# Patient Record
Sex: Female | Born: 1950 | Race: White | Hispanic: No | Marital: Single | Smoking: Never smoker
Health system: Southern US, Community
[De-identification: ages and names within clinical notes are randomized; demographics above are authoritative.]

## PROBLEM LIST (undated history)

## (undated) DIAGNOSIS — I10 Essential (primary) hypertension: Secondary | ICD-10-CM

## (undated) DIAGNOSIS — E039 Hypothyroidism, unspecified: Secondary | ICD-10-CM

## (undated) DIAGNOSIS — I639 Cerebral infarction, unspecified: Secondary | ICD-10-CM

## (undated) DIAGNOSIS — E785 Hyperlipidemia, unspecified: Secondary | ICD-10-CM

## (undated) DIAGNOSIS — F32A Depression, unspecified: Secondary | ICD-10-CM

## (undated) DIAGNOSIS — E119 Type 2 diabetes mellitus without complications: Secondary | ICD-10-CM

## (undated) DIAGNOSIS — F329 Major depressive disorder, single episode, unspecified: Secondary | ICD-10-CM

## (undated) HISTORY — PX: CHOLECYSTECTOMY: SHX55

## (undated) HISTORY — PX: VAGINAL HYSTERECTOMY: SUR661

## (undated) HISTORY — PX: BACK SURGERY: SHX140

## (undated) HISTORY — PX: APPENDECTOMY: SHX54

---

## 1998-04-07 ENCOUNTER — Emergency Department (HOSPITAL_COMMUNITY): Admission: EM | Admit: 1998-04-07 | Discharge: 1998-04-07 | Payer: Self-pay

## 2001-11-21 ENCOUNTER — Encounter: Admission: RE | Admit: 2001-11-21 | Discharge: 2001-11-21 | Payer: Self-pay | Admitting: Family Medicine

## 2001-11-21 ENCOUNTER — Encounter: Payer: Self-pay | Admitting: Family Medicine

## 2001-11-28 ENCOUNTER — Encounter: Payer: Self-pay | Admitting: Family Medicine

## 2001-11-28 ENCOUNTER — Encounter: Admission: RE | Admit: 2001-11-28 | Discharge: 2001-11-28 | Payer: Self-pay | Admitting: Family Medicine

## 2002-09-19 ENCOUNTER — Encounter (INDEPENDENT_AMBULATORY_CARE_PROVIDER_SITE_OTHER): Payer: Self-pay | Admitting: Specialist

## 2002-09-19 ENCOUNTER — Inpatient Hospital Stay (HOSPITAL_COMMUNITY): Admission: RE | Admit: 2002-09-19 | Discharge: 2002-09-21 | Payer: Self-pay | Admitting: Obstetrics and Gynecology

## 2002-11-11 ENCOUNTER — Encounter: Payer: Self-pay | Admitting: Emergency Medicine

## 2002-11-11 ENCOUNTER — Emergency Department (HOSPITAL_COMMUNITY): Admission: EM | Admit: 2002-11-11 | Discharge: 2002-11-11 | Payer: Self-pay | Admitting: Emergency Medicine

## 2003-05-16 ENCOUNTER — Encounter: Payer: Self-pay | Admitting: Emergency Medicine

## 2003-05-16 ENCOUNTER — Emergency Department (HOSPITAL_COMMUNITY): Admission: EM | Admit: 2003-05-16 | Discharge: 2003-05-16 | Payer: Self-pay | Admitting: Emergency Medicine

## 2004-07-20 ENCOUNTER — Encounter: Admission: RE | Admit: 2004-07-20 | Discharge: 2004-07-20 | Payer: Self-pay | Admitting: Obstetrics and Gynecology

## 2005-08-21 ENCOUNTER — Encounter: Admission: RE | Admit: 2005-08-21 | Discharge: 2005-08-21 | Payer: Self-pay | Admitting: Family Medicine

## 2005-09-09 ENCOUNTER — Ambulatory Visit (HOSPITAL_COMMUNITY): Admission: RE | Admit: 2005-09-09 | Discharge: 2005-09-09 | Payer: Self-pay | Admitting: Urology

## 2005-12-02 ENCOUNTER — Encounter: Admission: RE | Admit: 2005-12-02 | Discharge: 2005-12-02 | Payer: Self-pay | Admitting: Obstetrics and Gynecology

## 2006-01-03 ENCOUNTER — Encounter: Admission: RE | Admit: 2006-01-03 | Discharge: 2006-01-03 | Payer: Self-pay | Admitting: Obstetrics and Gynecology

## 2006-06-28 ENCOUNTER — Encounter: Admission: RE | Admit: 2006-06-28 | Discharge: 2006-06-28 | Payer: Self-pay | Admitting: Obstetrics and Gynecology

## 2006-09-12 ENCOUNTER — Encounter: Admission: RE | Admit: 2006-09-12 | Discharge: 2006-09-12 | Payer: Self-pay | Admitting: Family Medicine

## 2007-06-14 ENCOUNTER — Ambulatory Visit: Payer: Self-pay | Admitting: Internal Medicine

## 2007-06-21 ENCOUNTER — Encounter: Admission: RE | Admit: 2007-06-21 | Discharge: 2007-06-21 | Payer: Self-pay | Admitting: Obstetrics and Gynecology

## 2007-06-26 ENCOUNTER — Ambulatory Visit: Payer: Self-pay | Admitting: Internal Medicine

## 2007-06-26 ENCOUNTER — Encounter: Payer: Self-pay | Admitting: Internal Medicine

## 2007-06-27 ENCOUNTER — Encounter: Admission: RE | Admit: 2007-06-27 | Discharge: 2007-06-27 | Payer: Self-pay | Admitting: Obstetrics and Gynecology

## 2007-12-27 ENCOUNTER — Encounter: Admission: RE | Admit: 2007-12-27 | Discharge: 2007-12-27 | Payer: Self-pay | Admitting: Family Medicine

## 2008-06-25 ENCOUNTER — Encounter: Admission: RE | Admit: 2008-06-25 | Discharge: 2008-06-25 | Payer: Self-pay | Admitting: Family Medicine

## 2009-07-07 ENCOUNTER — Encounter: Admission: RE | Admit: 2009-07-07 | Discharge: 2009-07-07 | Payer: Self-pay | Admitting: Family Medicine

## 2009-08-07 ENCOUNTER — Encounter (INDEPENDENT_AMBULATORY_CARE_PROVIDER_SITE_OTHER): Payer: Self-pay | Admitting: Internal Medicine

## 2009-08-07 ENCOUNTER — Inpatient Hospital Stay (HOSPITAL_COMMUNITY): Admission: EM | Admit: 2009-08-07 | Discharge: 2009-08-07 | Payer: Self-pay | Admitting: Emergency Medicine

## 2009-08-07 ENCOUNTER — Ambulatory Visit: Payer: Self-pay | Admitting: Cardiology

## 2009-08-11 ENCOUNTER — Encounter: Admission: RE | Admit: 2009-08-11 | Discharge: 2009-08-11 | Payer: Self-pay | Admitting: Family Medicine

## 2010-11-05 ENCOUNTER — Observation Stay (HOSPITAL_COMMUNITY)
Admission: EM | Admit: 2010-11-05 | Discharge: 2010-11-08 | Disposition: A | Discharge status: Home / Self Care | Payer: Self-pay | Attending: Internal Medicine | Admitting: Internal Medicine

## 2010-11-05 ENCOUNTER — Emergency Department (HOSPITAL_COMMUNITY): Payer: Self-pay

## 2010-11-05 DIAGNOSIS — M25569 Pain in unspecified knee: Secondary | ICD-10-CM | POA: Insufficient documentation

## 2010-11-05 DIAGNOSIS — F3289 Other specified depressive episodes: Secondary | ICD-10-CM | POA: Insufficient documentation

## 2010-11-05 DIAGNOSIS — I10 Essential (primary) hypertension: Secondary | ICD-10-CM | POA: Insufficient documentation

## 2010-11-05 DIAGNOSIS — F329 Major depressive disorder, single episode, unspecified: Secondary | ICD-10-CM | POA: Insufficient documentation

## 2010-11-05 DIAGNOSIS — E039 Hypothyroidism, unspecified: Secondary | ICD-10-CM | POA: Insufficient documentation

## 2010-11-05 DIAGNOSIS — K5289 Other specified noninfective gastroenteritis and colitis: Principal | ICD-10-CM | POA: Insufficient documentation

## 2010-11-05 DIAGNOSIS — M542 Cervicalgia: Secondary | ICD-10-CM | POA: Insufficient documentation

## 2010-11-05 DIAGNOSIS — E119 Type 2 diabetes mellitus without complications: Secondary | ICD-10-CM | POA: Insufficient documentation

## 2010-11-05 DIAGNOSIS — R55 Syncope and collapse: Secondary | ICD-10-CM | POA: Insufficient documentation

## 2010-11-05 DIAGNOSIS — R51 Headache: Secondary | ICD-10-CM | POA: Insufficient documentation

## 2010-11-05 LAB — URINALYSIS, ROUTINE W REFLEX MICROSCOPIC
Glucose, UA: NEGATIVE mg/dL
Hgb urine dipstick: NEGATIVE
Specific Gravity, Urine: 1.022 (ref 1.005–1.030)
Urobilinogen, UA: 0.2 mg/dL (ref 0.0–1.0)
pH: 6.5 (ref 5.0–8.0)

## 2010-11-05 LAB — POCT CARDIAC MARKERS
Myoglobin, poc: 93.7 ng/mL (ref 12–200)
Myoglobin, poc: 71.2 ng/mL (ref 12–200)
Troponin i, poc: <0.05 ng/mL (ref 0.00–0.09)

## 2010-11-05 LAB — COMPREHENSIVE METABOLIC PANEL
ALT: 18 U/L (ref 0–35)
BUN: 18 mg/dL (ref 6–23)
Chloride: 104 mEq/L (ref 96–112)
GFR calc Af Amer: >60 mL/min (ref >60)
Glucose, Bld: 115 mg/dL — ABNORMAL HIGH (ref 70–99)
Sodium: 137 mEq/L (ref 135–145)
Total Bilirubin: 0.6 mg/dL (ref 0.3–1.2)

## 2010-11-05 LAB — URINE MICROSCOPIC-ADD ON

## 2010-11-05 LAB — DIFFERENTIAL
Lymphocytes Relative: 14 % (ref 12–46)
Lymphs Abs: 2 10*3/uL (ref 0.7–4.0)
Monocytes Absolute: 0.7 10*3/uL (ref 0.1–1.0)
Neutro Abs: 11.2 10*3/uL — ABNORMAL HIGH (ref 1.7–7.7)
Neutrophils Relative %: 79 % — ABNORMAL HIGH (ref 43–77)

## 2010-11-05 LAB — CBC
MCHC: 33.3 g/dL (ref 30.0–36.0)
Platelets: 344 10*3/uL (ref 150–400)
RDW: 12.8 % (ref 11.5–15.5)

## 2010-11-06 ENCOUNTER — Observation Stay (HOSPITAL_COMMUNITY): Payer: Self-pay

## 2010-11-06 LAB — URINE CULTURE
Colony Count: 55000
Culture  Setup Time: 201203160033

## 2010-11-06 LAB — CK TOTAL AND CKMB (NOT AT ARMC)
CK, MB: 1.1 ng/mL (ref 0.3–4.0)
Relative Index: 1 (ref 0.0–2.5)
Total CK: 105 U/L (ref 7–177)

## 2010-11-06 LAB — HEMOGLOBIN A1C: Mean Plasma Glucose: 140 mg/dL — ABNORMAL HIGH (ref <117)

## 2010-11-06 LAB — CLOSTRIDIUM DIFFICILE BY PCR: Toxigenic C. Difficile by PCR: NEGATIVE

## 2010-11-06 LAB — CARDIAC PANEL(CRET KIN+CKTOT+MB+TROPI)
Total CK: 155 U/L (ref 7–177)
Total CK: 173 U/L (ref 7–177)
Troponin I: 0.01 ng/mL (ref 0.00–0.06)

## 2010-11-06 LAB — TROPONIN I: Troponin I: 0.01 ng/mL (ref 0.00–0.06)

## 2010-11-06 LAB — GLUCOSE, CAPILLARY
Glucose-Capillary: 121 mg/dL — ABNORMAL HIGH (ref 70–99)
Glucose-Capillary: 99 mg/dL (ref 70–99)

## 2010-11-07 LAB — CBC
HCT: 33.3 % — ABNORMAL LOW (ref 36.0–46.0)
MCH: 29.9 pg (ref 26.0–34.0)
MCHC: 32.4 g/dL (ref 30.0–36.0)
RDW: 13.4 % (ref 11.5–15.5)

## 2010-11-07 LAB — BASIC METABOLIC PANEL
BUN: 8 mg/dL (ref 6–23)
CO2: 27 mEq/L (ref 19–32)
Calcium: 7.9 mg/dL — ABNORMAL LOW (ref 8.4–10.5)
GFR calc non Af Amer: >60 mL/min (ref >60)
Glucose, Bld: 98 mg/dL (ref 70–99)

## 2010-11-07 LAB — GLUCOSE, CAPILLARY
Glucose-Capillary: 94 mg/dL (ref 70–99)
Glucose-Capillary: 91 mg/dL (ref 70–99)
Glucose-Capillary: 84 mg/dL (ref 70–99)

## 2010-11-08 ENCOUNTER — Other Ambulatory Visit: Payer: Self-pay | Admitting: Internal Medicine

## 2010-11-08 DIAGNOSIS — R072 Precordial pain: Secondary | ICD-10-CM

## 2010-11-09 LAB — GLUCOSE, CAPILLARY: Glucose-Capillary: 115 mg/dL — ABNORMAL HIGH (ref 70–99)

## 2010-11-10 LAB — STOOL CULTURE

## 2010-11-12 LAB — CULTURE, BLOOD (ROUTINE X 2)
Culture  Setup Time: 201203161427
Culture: NO GROWTH
Culture: NO GROWTH

## 2010-11-12 NOTE — Discharge Summary (Signed)
[name], CIMINI                ACCOUNT NO.:  [id number]  MEDICAL RECORD NO.:  [id number]           PATIENT TYPE:  O  LOCATION:  6741                         FACILITY:  [facility]  PHYSICIAN:  Jeoffrey Massed, MD    DATE OF BIRTH:  [birth year]  DATE OF ADMISSION:  11/05/2010 DATE OF DISCHARGE:                        DISCHARGE SUMMARY - REFERRING   PRIMARY CARE PRACTITIONER:  Dr. Shaune Pollack.  PRIMARY DISCHARGE DIAGNOSES: 1. Gastroenteritis, likely viral, now resolved. 2. Syncope, likely vasovagal secondary to above.  SECONDARY DISCHARGE DIAGNOSES: 1. Hypothyroidism. 2. Hypertension. 3. Depression.  DISCHARGE MEDICATIONS: 1. Synthroid 112 mcg 1 tablet p.o. daily. 2. Zofran 4 mg 1 tablet p.o. q.6 h. p.r.n. 3. Aspirin 81 mg 1 tablet p.o. daily. 4. Celexa 20 mg 1 tablet p.o. daily. 5. Imodium 2 mg 1 tablet p.o. daily. 6. Lisinopril/hydrochlorothiazide 10/12.5 one tablet p.o. daily.  CONSULTATION:  None.  BRIEF HISTORY OF PRESENT ILLNESS:  The patient is a very pleasant 60- year-old female with a known history of hypertension, diet-controlled diabetes, depression with prior syncopal events all of them related to vomiting and retching, comes in with nausea, vomiting, and diarrhea. Per the patient, she had numerous episodes of nausea, vomiting, and diarrhea and apparently was in the ED, was evaluated and given IV fluids.  She was also given supportive care and discharged back home. However, when she reached the parking lot, the patient had another episode of nausea with retching and apparently had a syncopal episode. She was then brought back to the ED and then admitted to the hospitalist service for further evaluation and treatment.  PERTINENT RADIOLOGICAL STUDIES: 1. CT of the head without contrast, normal exam. 2. CT of the cervical spine without contrast showed no acute     abnormalities. 3. CT maxillofacial region without contrast, no significant     abnormality. 4.  X-ray of the left knee was negative.  PERTINENT LABORATORY DATA: 1. Stool cultures were negative so far. 2. Blood cultures negative to date. 3. C diff PCR negative. 4. Cardiac enzymes were cycled and these were negative.  BRIEF HOSPITAL COURSE: 1. Gastroenteritis, likely viral.  The patient had no evidence of     fever, leukocytosis.  She was given supportive care in terms of IV     fluids and antiemetics and slowly advanced on her diet.  At this     moment, she is tolerating a regular diet and with no further     nausea, vomiting, or diarrhea.  She will be discharged home. 2. Syncope.  This is clearly vasovagal as this was related to episode     of nausea and retching.  Per the patient, she has had around 4-5     syncopal episodes throughout her lifetime and all of these have     been related to either nausea, vomiting.  This is likely secondary     to increased vagal tone.  A 2-D echocardiogram has been done;     however, we do not have the official reading yet.  I reviewed the     chart and that showed a prior echocardiogram done in 2010  which was     essentially normal.  The patient will follow up with her primary     care practitioner to follow up the echocardiogram.  Her telemetry     monitor was pretty unremarkable. 3. Hypertension.  She is to continue her usual medications.  DISPOSITION:  At this point, the patient is stable to be discharged home.  FOLLOWUP INSTRUCTIONS: 1. The patient will follow up with the primary care practitioner in     the next 5-7 days.  She is to call and make an appointment. 2. When she follows up with her primary care practitioner, a 2-D     echocardiogram that was done today will need to be followed up as     well. 3. Total time spent 45 minutes.     Jeoffrey Massed, MD     SG/MEDQ  D:  11/08/2010  T:  11/08/2010  Job:  [id number]  cc:   [name], M.D.  Electronically Signed by Jeoffrey Massed  on 11/12/2010 08:36:11 PM

## 2010-11-23 LAB — COMPREHENSIVE METABOLIC PANEL
ALT: 31 U/L (ref 0–35)
Alkaline Phosphatase: 46 U/L (ref 39–117)
BUN: 15 mg/dL (ref 6–23)
CO2: 26 mEq/L (ref 19–32)
Chloride: 106 mEq/L (ref 96–112)
GFR calc non Af Amer: >60 mL/min (ref >60)
Glucose, Bld: 139 mg/dL — ABNORMAL HIGH (ref 70–99)
Potassium: 3.8 mEq/L (ref 3.5–5.1)
Sodium: 138 mEq/L (ref 135–145)
Total Bilirubin: 0.6 mg/dL (ref 0.3–1.2)
Total Protein: 6.7 g/dL (ref 6.0–8.3)

## 2010-11-23 LAB — CBC
HCT: 38.5 % (ref 36.0–46.0)
Hemoglobin: 12.9 g/dL (ref 12.0–15.0)
RBC: 4.15 MIL/uL (ref 3.87–5.11)
RDW: 13.4 % (ref 11.5–15.5)

## 2010-11-23 LAB — GLUCOSE, CAPILLARY: Glucose-Capillary: 122 mg/dL — ABNORMAL HIGH (ref 70–99)

## 2010-11-23 LAB — CULTURE, BLOOD (ROUTINE X 2)

## 2010-11-23 LAB — CARDIAC PANEL(CRET KIN+CKTOT+MB+TROPI)
CK, MB: 2.7 ng/mL (ref 0.3–4.0)
Relative Index: 1.9 (ref 0.0–2.5)

## 2010-11-23 NOTE — H&P (Signed)
[name], [name]                ACCOUNT NO.:  [id number]  MEDICAL RECORD NO.:  [id number]           PATIENT TYPE:  E  LOCATION:  [facility]                         FACILITY:  [facility]  PHYSICIAN:  Eduard Clos, MDDATE OF BIRTH:  11-Apr-1951  DATE OF ADMISSION:  11/05/2010 DATE OF DISCHARGE:                             HISTORY & PHYSICAL   PRIMARY CARE PHYSICIAN:  [name], MD  CHIEF COMPLAINTS:  Loss of consciousness, nausea, vomiting and diarrhea.  HISTORY OF PRESENTING ILLNESS:  A 60 year old female with known history of hypertension, diabetes mellitus type 1 diet-controlled, history of depression, has been experiencing some sudden onset of nausea and vomiting today afternoon at work.  She had multiple episode of nausea, vomiting, and diarrhea.  No abdominal pain.  The diarrhea was loose stools and watery.  There was no blood in the vomitus or diarrhea.  The patient was hydrated and given some antiemetics and was initially discharged when she reached car park she had another episode of nausea and she feel unconscious in front of her daughter and hit the car dashboard, she was unconscious for 30 seconds and she had urinated and also there was no seizure-like activity.  The patient woke up spontaneously.  She did not have any focal deficit, did not have any chest pain.  The patient at this time has been readmitted for syncope and persistent nausea and vomiting with diarrhea.  The patient denies any chest pain.  Denies any shortness of breath, palpitation, or dizziness.  Denies any focal deficit.  Denies any abdominal pain, dysuria, or discharges.  She has mild pain in the right maxillary area where she hit the dashboard of the car.  The patient does have some pain in the neck which radiates to her left arm which happened at the same time when she was in the assisted living facility where she was working.  The pain still persists pain.  PAST MEDICAL HISTORY:   Hypertension, diabetes mellitus type 2 diet control, hyperlipidemia, and depression.  PAST SURGICAL HISTORY:  She has had cholecystectomy, back surgery, hysterectomy, lithotripsy, and tubal ligation.  MEDICATIONS PRIOR TO ADMISSION: 1. Lisinopril and hydrochlorothiazide 10/12.5 p.o. daily. 2. Citalopram 20 mg daily. 3. Synthroid 112 mcg daily.  ALLERGIES:  No known drug allergies.  SOCIAL HISTORY:  The patient works as a Lawyer.  She is a full code. Denies smoking cigarette, drinking alcohol, or using illegal drugs.  REVIEW OF SYSTEMS:  As per history of presenting illness, nothing else significant.  PHYSICAL EXAMINATION:  GENERAL:  The patient examined at bedside, not in acute distress. VITAL SIGNS:  Blood pressure is 150/70, pulse 102 per minute and sinus tachy, temperature 98.8, respirations 18, and O2 sat 97%. HEENT:  Anicteric, but has mild bruise in the right maxillary which is tender.  The patient is able to the open mouth without difficulty.  She is able to protrude tongue, there is no tongue bite, tongue is midline. No facial asymmetry.  No neck rigidity. CHEST:  Bilateral air entry present.  No rhonchi.  No crepitation. HEART:  S1 and S2 heard. ABDOMEN:  Soft and  nontender.  Bowel sounds heard. CNS:  Alert, awake, and oriented to time, place, and person. EXTREMITIES:  Peripheral pulses felt.  No edema.  Moves upper and lower extremities 5/5.  Peripheral pulses felt.  No edema.  LABORATORY DATA:  Chest x-ray shows chronic change without active cardiopulmonary disease.  CBC, WBC is 14.1, hemoglobin is 13.9, hematocrit 41.8, platelets 344, and neutrophils 79%.  Complete metabolic panel, sodium 137, potassium 3.5, chloride 104, carbon dioxide 26, glucose 115, BUN 18, creatinine 0.9, total bili is 0.6, alkaline phosphatase 51, AST 28, ALT 18, total bili 7.8, albumin 4, calcium 8.9, CK-MB less than 1, troponin-I less than 0.05, and myoglobin 97.  UA is showing trace  leukocytes, wbc's 0-2, bacteria few, and squamous cells many.  ASSESSMENT: 1. Syncope probably vasovagal and also dehydration. 2. Nausea, vomiting and diarrhea probably viral. 3. Hypertension. 4. Diabetes mellitus type 2, on diet. 5. History of hypothyroidism.  PLAN: 1. At this time, we will admit the patient to telemetry if the patient     had a syncopal episode. 2. For syncope, at this time I think it may be vasovagal after she had     nausea and vomiting spell.  We will observe in the telemetry and at     this time we will hydrate the patient, we will check orthostatics     in a.m., also get a 2-D echo and cycling cardiac markers. 3. The patient did have a fall and hit her face with mild bruise     there, we are going to get a CT head CT sinuses and C-spine. 4. Nausea, vomiting, and diarrhea.  At this time, the patient does     have leukocytosis.  The patient does work in a health care     facility. I am going to get stool for C.diff PCR, culture and     sensitivity, ova and parasite.  I am empirically placing the     patient on Cipro and Flagyl.  Also get a urine culture.  If  her     nausea and vomiting persistent at that time may consider further     radiological studies. 5. Hypertension.  We will continue lisinopril and hold off  the     hydrochlorothiazide. 6. Diabetes.  The patient is on diet.  We will check CBG with     sensitive sliding scale. 7. Further recommendation as condition evolves.    Eduard Clos, MD    ANK/MEDQ  D:  11/05/2010  T:  11/05/2010  Job:  [id number]  cc:   [name], M.D.  Electronically Signed by Midge Minium MD on 11/23/2010 07:53:25 AM

## 2010-11-24 LAB — COMPREHENSIVE METABOLIC PANEL
ALT: 23 U/L (ref 0–35)
AST: 25 U/L (ref 0–37)
Alkaline Phosphatase: 54 U/L (ref 39–117)
CO2: 24 mEq/L (ref 19–32)
Calcium: 9.1 mg/dL (ref 8.4–10.5)
GFR calc Af Amer: >60 mL/min (ref >60)
Potassium: 3.8 mEq/L (ref 3.5–5.1)
Sodium: 137 mEq/L (ref 135–145)
Total Protein: 8 g/dL (ref 6.0–8.3)

## 2010-11-24 LAB — DIFFERENTIAL
Basophils Relative: 1 % (ref 0–1)
Eosinophils Absolute: 0 10*3/uL (ref 0.0–0.7)
Eosinophils Relative: 0 % (ref 0–5)
Lymphs Abs: 0.5 10*3/uL — ABNORMAL LOW (ref 0.7–4.0)
Monocytes Relative: 3 % (ref 3–12)

## 2010-11-24 LAB — URINALYSIS, ROUTINE W REFLEX MICROSCOPIC
Bilirubin Urine: NEGATIVE
Nitrite: NEGATIVE
Specific Gravity, Urine: 1.017 (ref 1.005–1.030)
Urobilinogen, UA: 0.2 mg/dL (ref 0.0–1.0)
pH: 6.5 (ref 5.0–8.0)

## 2010-11-24 LAB — CBC
Hemoglobin: 14.8 g/dL (ref 12.0–15.0)
MCHC: 32.8 g/dL (ref 30.0–36.0)
RBC: 4.82 MIL/uL (ref 3.87–5.11)
RDW: 13.3 % (ref 11.5–15.5)

## 2011-01-08 NOTE — Discharge Summary (Signed)
   NAMECRYSTIE, [name]                           ACCOUNT NO.:  [id number]   MEDICAL RECORD NO.:  [id number]                   PATIENT TYPE:  INP   LOCATION:  0447                                 FACILITY:  [facility]   PHYSICIAN:  Katherine Roan, M.D.               DATE OF BIRTH:  1950/10/14   DATE OF ADMISSION:  09/19/2002  DATE OF DISCHARGE:  09/21/2002                                 DISCHARGE SUMMARY   ADMISSION DIAGNOSES:  1. Abnormal uterine bleeding.  2. Pelvic relaxation.   DISCHARGE DIAGNOSES:  1. Abnormal uterine bleeding.  2. Pelvic relaxation.   OPERATIONS PERFORMED:  1. Pelvic examination under anesthesia.  2. Vaginal hysterectomy.  3. Enterocele repair.  4. Posterior repair.  5. Perineoplasty.   HISTORY OF PRESENT ILLNESS:  The patient is a 60 year old female, status  post tubal ligation, who continues to complain of abnormal uterine bleeding  and pelvic relaxation with pelvic pressure.   PHYSICAL EXAMINATION:  On examination, she had a large posterior segment  enterocele and rectocele.   LABORATORY DATA:  The admission hemoglobin was 13 and hematocrit 39.  The  cardiogram was normal.   HOSPITAL COURSE:  The patient was admitted to the hospital and underwent an  uneventful hysterectomy with posterior segment repair and perineoplasty.  Her postoperative course was uncomplicated.  The vaginal pack was removed.  She was voiding satisfactorily on September 21, 2001, without specific  complaints.   DISPOSITION:  She was given detailed instruction and was asked to return to  the office in two weeks.   DISCHARGE MEDICATIONS:  She is to continue Surfak, Percocet, and Aleve for  pain.  She is to start Climara 0.060 patch one week.   CONDITION ON DISCHARGE:  Improved.                                               Katherine Roan, M.D.    SDM/MEDQ  D:  10/04/2002  T:  10/04/2002  Job:  [id number]

## 2011-01-08 NOTE — H&P (Signed)
NAME:  [name], [place] NO.:  [id number]   MEDICAL RECORD NO.:  [id number]                   PATIENT TYPE:   LOCATION:                                       FACILITY:   PHYSICIAN:  Katherine Roan, M.D.               DATE OF BIRTH:   DATE OF ADMISSION:  DATE OF DISCHARGE:                                HISTORY & PHYSICAL   CHIEF COMPLAINT:  Continued abdominal uterine bleeding.   HISTORY OF PRESENT ILLNESS:  The patient is a 60 year old gravida 3, para 3,  status post tubal ligation in 1979 who underwent a dilatation and curettage  and hysterectomy at [facility] for abnormal uterine bleeding back  several years ago. This was done in 1995. At that time there was no  endometrial pathology noted. Two small polyps were found.   She continued to be followed with oral contraceptive therapy and had  abnormal bleeding on oral contraceptive therapy. An endometrial biopsy done  recently was benign. Because of the continued bleeding and the preoperative  diagnosis of adenomyosis found on hysteroscopy, the patient is admitted for  a vaginal hysterectomy. She also has a large posterior segment relaxation  with pelvic pressure and desires this to be corrected.   PAST MEDICAL HISTORY:  1. Lumbar laminectomy.  2. Gallbladder.  3. Cystoscopy with removal of calculus.   MEDICATIONS:  She is on no medications other than Synthroid and Celexa.   ALLERGIES:  No known allergies.   REVIEW OF SYSTEMS:  HEENT: She wears glasses but notes no decrease in visual  or auditory acuity. No headaches or dizziness. HEART:  No history of  hypertension, no rheumatic fever, no chest pain, no shortness of breath.  LUNGS:  No chronic cough, no asthma, no hay fever, no hemoptysis.  GENITOURINARY:  No stress urinary incontinence or frequency of urination.  She denies any history of nephritis or UTIs. GI:  No bowel habit change, no  melena, no weight loss or gain. She has no  history of ulcer disease. She may  have a mild amount of reflux but it is doubtful. MUSCULOSKELETAL:  Muscle,  bones and joints are unremarkable.   SOCIAL HISTORY:  She works for [employer]. She does not drink or  smoke.   FAMILY HISTORY:  Her mother is in insulin dependent diabetic. Her father  died at age 60 from colon cancer. She has one brother who had a history of  sinus cancer. She has a sister with breast cancer. Her mother has heart  disease as well.   PHYSICAL EXAMINATION:  GENERAL:  A well developed, well nourished, alert  female who appears to be her stated age of 46. Examination reveals a weight  of 215.  VITAL SIGNS:  Blood pressure 140/80.  HEENT:  Eyes, pupils are round and regular, reactive to light and  accommodation. The oropharynx is not injected.  NECK:  Supple. Thyroid is not enlarged. Carotid pulses equal without bruits.  The trachea is midline. No adenopathy appreciated.  BREASTS:  No masses or tenderness. Axilla free from adenopathy.  LUNGS:  Clear to auscultation and percussion. Diaphragms move well with  inspiration and expiration.  HEART:  Normal sinus rhythm, no murmurs, no heaves, thrills, rubs or  gallops.  ABDOMEN:  Somewhat obese. There is a right subcostal incision from her  gallbladder. No hernias are noted. Bowel sounds are noted and no tenderness.  No bruits are heard.  EXTREMITIES:  Femoral pulses are equal. Trace edema with good reflexes and  equal pulses.  PELVIC:  Examination reveals a fairly well supported cervix clinically.  There is no anterior segment relaxation. Her uterus appears to be normal  size and shape. There is a fairly large rectocele/enterocele component.  Hemoccult is negative.   IMPRESSION:  Continued abnormal uterine bleeding, suspect adenomyosis and  enlarged posterior segment relaxing with pelvic pressure.   PLAN:  Vaginal  hysterectomy and pelvic repair. The risks and benefits have  been discussed with the  patient including damage to bowel and  bladder  infection and hemorrhage and the associated risks of blood transfusions.                                               Katherine Roan, M.D.    SDM/MEDQ  D:  09/17/2002  T:  09/17/2002  Job:  [id number]

## 2011-01-08 NOTE — Op Note (Signed)
[name], [name]                           ACCOUNT NO.:  [id number]   MEDICAL RECORD NO.:  [id number]                   PATIENT TYPE:  INP   LOCATION:  0010                                 [facility]ACILITY:  [facility]   PHYSICIAN:  Katherine Roan, M.D.               DATE OF BIRTH:  1950-09-28   DATE OF PROCEDURE:  09/19/2002  DATE OF DISCHARGE:                                 OPERATIVE REPORT   PREOPERATIVE DIAGNOSES:  1. Abnormal uterine bleeding, probable adenomyosis, persistent.  2. 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   PROCEDURES:  1. Pelvic examination under anesthesia.  2. Vaginal hysterectomy.  3. Repair of enterocele.  4. Posterior repair and perineoplasty.   DESCRIPTION OF PROCEDURE:  The patient was placed in the lithotomy position.  Allen stirrups were carefully positioned for optimal exposure.  Pelvic exam  under anesthesia revealed an anterior uterus with no masses.  There was no  fixation.  The patient was then prepped and draped in the usual fashion, the  cervix grasped with a tenaculum.  The posterior cul-de-sac was injected with  a 1% Xylocaine with epinephrine and the posterior cul-de-sac was incised.  The posterior peritoneum was entered, the cul-de-sac was entered.  The  Bonnano retractor was placed in.  The uterosacral and cardinal ligaments  were then carefully skeletonized and ligated with 0 chromic suture.  The  anterior cul-de-sac was entered.  The peritoneal reflection was identified  and entered.  The bladder was elevated.  The uterine artery and upper broad  ligament was clamped and ligated in succession with 0 chromic suture.  The  specimen was retroverted, and then the utero-ovarian anastomosis and tube  were clamped and ligated.  The specimen was removed from the operative  field.  Both ovaries were normal.  The utero-ovarian pedicles were tied and  suture ligated.  Following this we  plicated the enterocele and the  uterosacral ligaments and dissected the enterocele space, enlarged the  enterocele vaginal incision, and closed this with interrupted sutures of 0  Vicryl.  Vicryl 3-0 was also utilized.  Then the pelvic peritoneum was  pursestringed with a 2-0 pursestring suture and the vagina was then closed  with a locking suture of 2-0 chromic and then the peritoneal suture was used  to close the anterior vagina.  We then dissected more of the rectocele and  plicated this defect laterally.  The prerectal fascia was approximated with  interrupted sutures of 0 Vicryl and 3-0 Vicryl.  Excess of the vaginal  mucosa was then trimmed and closed with a locking suture of 2-0 chromic.  The perineum was then dissected and the bulbocavernosus muscle was  skeletonized and then closed with interrupted sutures of 3-0 Vicryl.  The  transversus perinei skin was likewise closed.  The skin was then closed with  a continuation of the  vaginal mucosa stitch with 2-0 chromic.  The vagina  was then packed with iodoform, and a Foley catheter was inserted.  Marcaine  0.5% with epinephrine was then instilled into the pelvic repair.  The  patient tolerated this surgery well.  Estimated blood loss was 200 mL.  She  was slow to recover from the muscle relaxant and at this dictation is still  in the operating room.                                                 Katherine Roan, M.D.    SDM/MEDQ  D:  09/19/2002  T:  09/19/2002  Job:  [id number]

## 2012-06-21 ENCOUNTER — Encounter: Payer: Self-pay | Admitting: Internal Medicine

## 2012-12-18 ENCOUNTER — Encounter: Payer: Self-pay | Admitting: Internal Medicine

## 2012-12-26 ENCOUNTER — Encounter: Payer: Self-pay | Admitting: Internal Medicine

## 2013-02-09 ENCOUNTER — Encounter (HOSPITAL_COMMUNITY): Payer: Self-pay | Admitting: *Deleted

## 2013-02-09 ENCOUNTER — Emergency Department (HOSPITAL_COMMUNITY): Payer: Self-pay

## 2013-02-09 ENCOUNTER — Observation Stay (HOSPITAL_COMMUNITY)
Admission: EM | Admit: 2013-02-09 | Discharge: 2013-02-10 | Disposition: A | Discharge status: Home / Self Care | Payer: MEDICAID | Attending: Internal Medicine | Admitting: Internal Medicine

## 2013-02-09 DIAGNOSIS — E669 Obesity, unspecified: Secondary | ICD-10-CM | POA: Insufficient documentation

## 2013-02-09 DIAGNOSIS — R072 Precordial pain: Secondary | ICD-10-CM

## 2013-02-09 DIAGNOSIS — Z79899 Other long term (current) drug therapy: Secondary | ICD-10-CM | POA: Insufficient documentation

## 2013-02-09 DIAGNOSIS — E876 Hypokalemia: Secondary | ICD-10-CM | POA: Insufficient documentation

## 2013-02-09 DIAGNOSIS — E119 Type 2 diabetes mellitus without complications: Secondary | ICD-10-CM | POA: Diagnosis present

## 2013-02-09 DIAGNOSIS — Z7982 Long term (current) use of aspirin: Secondary | ICD-10-CM | POA: Insufficient documentation

## 2013-02-09 DIAGNOSIS — R0789 Other chest pain: Principal | ICD-10-CM | POA: Insufficient documentation

## 2013-02-09 DIAGNOSIS — F3289 Other specified depressive episodes: Secondary | ICD-10-CM | POA: Insufficient documentation

## 2013-02-09 DIAGNOSIS — F329 Major depressive disorder, single episode, unspecified: Secondary | ICD-10-CM | POA: Insufficient documentation

## 2013-02-09 DIAGNOSIS — Z8249 Family history of ischemic heart disease and other diseases of the circulatory system: Secondary | ICD-10-CM | POA: Insufficient documentation

## 2013-02-09 DIAGNOSIS — I10 Essential (primary) hypertension: Secondary | ICD-10-CM | POA: Insufficient documentation

## 2013-02-09 DIAGNOSIS — M940 Chondrocostal junction syndrome [Tietze]: Secondary | ICD-10-CM | POA: Insufficient documentation

## 2013-02-09 DIAGNOSIS — R079 Chest pain, unspecified: Secondary | ICD-10-CM

## 2013-02-09 DIAGNOSIS — Z6834 Body mass index (BMI) 34.0-34.9, adult: Secondary | ICD-10-CM | POA: Insufficient documentation

## 2013-02-09 DIAGNOSIS — E039 Hypothyroidism, unspecified: Secondary | ICD-10-CM | POA: Insufficient documentation

## 2013-02-09 HISTORY — DX: Depression, unspecified: F32.A

## 2013-02-09 HISTORY — DX: Essential (primary) hypertension: I10

## 2013-02-09 HISTORY — DX: Type 2 diabetes mellitus without complications: E11.9

## 2013-02-09 HISTORY — DX: Major depressive disorder, single episode, unspecified: F32.9

## 2013-02-09 LAB — COMPREHENSIVE METABOLIC PANEL
BUN: 13 mg/dL (ref 6–23)
Calcium: 9.1 mg/dL (ref 8.4–10.5)
Creatinine, Ser: 0.91 mg/dL (ref 0.50–1.10)
GFR calc Af Amer: 78 mL/min — ABNORMAL LOW (ref >90)
Glucose, Bld: 126 mg/dL — ABNORMAL HIGH (ref 70–99)
Total Protein: 7.5 g/dL (ref 6.0–8.3)

## 2013-02-09 LAB — TROPONIN I: Troponin I: <0.3 ng/mL (ref <0.30)

## 2013-02-09 LAB — CBC WITH DIFFERENTIAL/PLATELET
Basophils Absolute: 0 10*3/uL (ref 0.0–0.1)
HCT: 39.7 % (ref 36.0–46.0)
Hemoglobin: 13.3 g/dL (ref 12.0–15.0)
Lymphocytes Relative: 40 % (ref 12–46)
Lymphs Abs: 3.7 10*3/uL (ref 0.7–4.0)
Monocytes Absolute: 0.9 10*3/uL (ref 0.1–1.0)
Monocytes Relative: 9 % (ref 3–12)
Neutro Abs: 4.4 10*3/uL (ref 1.7–7.7)
RBC: 4.34 MIL/uL (ref 3.87–5.11)
RDW: 13 % (ref 11.5–15.5)
WBC: 9.3 10*3/uL (ref 4.0–10.5)

## 2013-02-09 LAB — LIPID PANEL
Cholesterol: 152 mg/dL (ref 0–200)
HDL: 34 mg/dL — ABNORMAL LOW (ref >39)
Total CHOL/HDL Ratio: 4.5 RATIO
Triglycerides: 134 mg/dL (ref <150)

## 2013-02-09 LAB — TSH: TSH: 1.459 u[IU]/mL (ref 0.350–4.500)

## 2013-02-09 MED ORDER — ASPIRIN 81 MG PO CHEW
324.0000 mg | CHEWABLE_TABLET | Freq: Once | ORAL | Status: AC
  Administered 2013-02-09: 324 mg via ORAL
  Filled 2013-02-09: qty 4

## 2013-02-09 MED ORDER — PNEUMOCOCCAL VAC POLYVALENT 25 MCG/0.5ML IJ INJ
0.5000 mL | INJECTION | INTRAMUSCULAR | Status: AC
  Administered 2013-02-10: 0.5 mL via INTRAMUSCULAR
  Filled 2013-02-09 (×2): qty 0.5

## 2013-02-09 MED ORDER — SODIUM CHLORIDE 0.9 % IJ SOLN: 3.0000 mL | INTRAMUSCULAR | Status: DC | PRN

## 2013-02-09 MED ORDER — CITALOPRAM HYDROBROMIDE 20 MG PO TABS
20.0000 mg | ORAL_TABLET | Freq: Every day | ORAL | Status: DC
  Administered 2013-02-09 – 2013-02-10 (×2): 20 mg via ORAL
  Filled 2013-02-09 (×2): qty 1

## 2013-02-09 MED ORDER — MORPHINE SULFATE 2 MG/ML IJ SOLN: 2.0000 mg | INTRAMUSCULAR | Status: DC | PRN

## 2013-02-09 MED ORDER — LISINOPRIL 10 MG PO TABS
10.0000 mg | ORAL_TABLET | Freq: Every day | ORAL | Status: DC
  Administered 2013-02-09 – 2013-02-10 (×2): 10 mg via ORAL
  Filled 2013-02-09 (×2): qty 1

## 2013-02-09 MED ORDER — LEVOTHYROXINE SODIUM 125 MCG PO TABS
125.0000 ug | ORAL_TABLET | Freq: Every day | ORAL | Status: DC
  Administered 2013-02-09 – 2013-02-10 (×2): 125 ug via ORAL
  Filled 2013-02-09 (×3): qty 1

## 2013-02-09 MED ORDER — SODIUM CHLORIDE 0.9 % IV SOLN: 250.0000 mL | INTRAVENOUS | Status: DC | PRN

## 2013-02-09 MED ORDER — SODIUM CHLORIDE 0.9 % IJ SOLN: 3.0000 mL | Freq: Two times a day (BID) | INTRAMUSCULAR | Status: DC

## 2013-02-09 MED ORDER — HYDROCHLOROTHIAZIDE 12.5 MG PO CAPS
12.5000 mg | ORAL_CAPSULE | Freq: Every day | ORAL | Status: DC
Start: 2013-02-09 — End: 2013-02-09
  Filled 2013-02-09: qty 1

## 2013-02-09 MED ORDER — ENOXAPARIN SODIUM 60 MG/0.6ML ~~LOC~~ SOLN
50.0000 mg | SUBCUTANEOUS | Status: DC
  Administered 2013-02-09 – 2013-02-10 (×2): 50 mg via SUBCUTANEOUS
  Filled 2013-02-09 (×2): qty 0.6

## 2013-02-09 MED ORDER — NITROGLYCERIN 0.4 MG SL SUBL: 0.4000 mg | SUBLINGUAL_TABLET | SUBLINGUAL | Status: DC | PRN

## 2013-02-09 MED ORDER — ONDANSETRON HCL 4 MG/2ML IJ SOLN
4.0000 mg | Freq: Once | INTRAMUSCULAR | Status: AC
  Administered 2013-02-09: 4 mg via INTRAVENOUS
  Filled 2013-02-09: qty 2

## 2013-02-09 MED ORDER — ONDANSETRON HCL 4 MG PO TABS: 4.0000 mg | ORAL_TABLET | Freq: Four times a day (QID) | ORAL | Status: DC | PRN

## 2013-02-09 MED ORDER — SODIUM CHLORIDE 0.9 % IJ SOLN
3.0000 mL | Freq: Two times a day (BID) | INTRAMUSCULAR | Status: DC
  Administered 2013-02-09 – 2013-02-10 (×3): 3 mL via INTRAVENOUS

## 2013-02-09 MED ORDER — LISINOPRIL-HYDROCHLOROTHIAZIDE 10-12.5 MG PO TABS: 1.0000 | ORAL_TABLET | Freq: Every day | ORAL | Status: DC

## 2013-02-09 MED ORDER — ASPIRIN EC 81 MG PO TBEC
81.0000 mg | DELAYED_RELEASE_TABLET | Freq: Every day | ORAL | Status: DC
  Administered 2013-02-09 – 2013-02-10 (×2): 81 mg via ORAL
  Filled 2013-02-09 (×2): qty 1

## 2013-02-09 MED ORDER — POTASSIUM CHLORIDE CRYS ER 20 MEQ PO TBCR
40.0000 meq | EXTENDED_RELEASE_TABLET | Freq: Once | ORAL | Status: AC
  Administered 2013-02-09: 40 meq via ORAL
  Filled 2013-02-09 (×2): qty 2

## 2013-02-09 MED ORDER — ENOXAPARIN SODIUM 40 MG/0.4ML ~~LOC~~ SOLN
40.0000 mg | SUBCUTANEOUS | Status: DC
  Filled 2013-02-09: qty 0.4

## 2013-02-09 MED ORDER — ATORVASTATIN CALCIUM 80 MG PO TABS
80.0000 mg | ORAL_TABLET | Freq: Every day | ORAL | Status: DC
  Administered 2013-02-09: 80 mg via ORAL
  Filled 2013-02-09 (×2): qty 1

## 2013-02-09 MED ORDER — POTASSIUM CHLORIDE CRYS ER 20 MEQ PO TBCR
40.0000 meq | EXTENDED_RELEASE_TABLET | Freq: Once | ORAL | Status: AC
  Administered 2013-02-09: 40 meq via ORAL
  Filled 2013-02-09: qty 2

## 2013-02-09 MED ORDER — ONDANSETRON HCL 4 MG/2ML IJ SOLN: 4.0000 mg | Freq: Four times a day (QID) | INTRAMUSCULAR | Status: DC | PRN

## 2013-02-09 MED ORDER — METOPROLOL TARTRATE 12.5 MG HALF TABLET
12.5000 mg | ORAL_TABLET | Freq: Two times a day (BID) | ORAL | Status: DC
Start: 2013-02-09 — End: 2013-02-09
  Administered 2013-02-09: 12.5 mg via ORAL
  Filled 2013-02-09 (×2): qty 1

## 2013-02-09 NOTE — Progress Notes (Signed)
*  PRELIMINARY RESULTS* Echocardiogram 2D Echocardiogram has been performed.  Jeryl Columbia 02/09/2013, 9:37 AM

## 2013-02-09 NOTE — ED Notes (Signed)
Pt states was at work and became nauseated;laid down and started having neck pain radiating left arm; progressed making her feel like it was taking all her energy to breathe

## 2013-02-09 NOTE — ED Notes (Signed)
Patient transported to X-ray 

## 2013-02-09 NOTE — H&P (Signed)
History and Physical  [name] OZH:[id number] DOB: [birth year] DOA: 02/09/2013  Referring physician: Marisa Severin MD PCP: No primary provider on file.   Chief Complaint: Chest pain  HPI:  Patient is a 62 year old female with past medical history most significant for diabetes, hypertension and depression. She works third shift and as she was getting ready to go to work at 8 PM she started having chest pain radiating to her neck and shoulder. This was a pressure like sensation in the middle of the chest, 6-7/10 in intensity, no exacerbating or relieving factors, associated with shortness of breath and cold sweat. Her supervisor asked her to go to ER at this time. Patient was given aspirin in the ER which relieved her pain from 6-7 to 3/10 at this time. Patient has had similar pains in the past and was told that this was coming from costochondritis. Patient has had negative stress test many years ago.  Patient is usually able to walk many blocks without any chest pain or shortness of breath. She hurt her knee 2 weeks ago and has not been able to walk much.   15 point review of system is negative except as noted above in the history of present illness.  Past Medical History  Diagnosis Date  . Hypertension   . Diabetes mellitus without complication   . Depression     Past Surgical History  Procedure Laterality Date  . Cholecystectomy    . Appendectomy    . Back surgery      Social History:  reports that she has never smoked. She does not have any smokeless tobacco history on file. She reports that she does not drink alcohol. Her drug history is not on file.  No Known Allergies  No family history on file.   Prior to Admission medications   Medication Sig Start Date End Date Taking? Authorizing Provider  aspirin EC 81 MG tablet Take 81 mg by mouth daily.   Yes Historical Provider, MD  citalopram (CELEXA) 20 MG tablet Take 20 mg by mouth daily.   Yes Historical Provider, MD   levothyroxine (SYNTHROID, LEVOTHROID) 125 MCG tablet Take 125 mcg by mouth daily before breakfast.   Yes Historical Provider, MD  lisinopril-hydrochlorothiazide (PRINZIDE,ZESTORETIC) 10-12.5 MG per tablet Take 1 tablet by mouth daily.   Yes Historical Provider, MD   Physical Exam: Filed Vitals:   02/09/13 0100 02/09/13 0130 02/09/13 0200 02/09/13 0300  BP: 105/63 96/67 91/67  107/72  Pulse: 75 71 69 73  Temp:      Resp: 17 15 12 17   SpO2: 95% 94% 92% 95%   Physical Exam: General: Vital signs reviewed and noted. Well-developed, well-nourished, in no acute distress; alert, appropriate and cooperative throughout examination.  Head: Normocephalic, atraumatic.  Eyes: PERRL, EOMI, No signs of anemia or jaundince.  Nose: Mucous membranes moist, not inflammed, nonerythematous.  Throat: Oropharynx nonerythematous, no exudate appreciated.   Neck: No deformities, masses, or tenderness noted.Supple, No carotid Bruits, no JVD.  Lungs:  Normal respiratory effort. Clear to auscultation BL without crackles or wheezes.  Heart: RRR. S1 and S2 normal without gallop, murmur, or rubs.chest pain was reproducible on the left upper intercostal area   Abdomen:  BS normoactive. Soft, Nondistended, non-tender.  No masses or organomegaly.  Extremities: No pretibial edema.  Neurologic: A&O X3, CN II - XII are grossly intact. Motor strength is 5/5 in the all 4 extremities, Sensations intact to light touch, Cerebellar signs negative.  Skin: No visible rashes, scars.  Wt Readings from Last 3 Encounters:  No data found for Wt    Labs on Admission:  Basic Metabolic Panel:  Recent Labs Lab 02/09/13 0100  NA 138  K 3.2*  CL 101  CO2 28  GLUCOSE 126*  BUN 13  CREATININE 0.91  CALCIUM 9.1    Liver Function Tests:  Recent Labs Lab 02/09/13 0100  AST 13  ALT 11  ALKPHOS 51  BILITOT 0.4  PROT 7.5  ALBUMIN 3.4*   CBC:  Recent Labs Lab 02/09/13 0100  WBC 9.3  NEUTROABS 4.4  HGB 13.3  HCT  39.7  MCV 91.5  PLT 376   Troponin (Point of Care Test)  Recent Labs  02/09/13 0103  TROPIPOC 0.00    Radiological Exams on Admission: Dg Chest 2 View  02/09/2013   *RADIOLOGY REPORT*  Clinical Data: Chest pain and pressure.  Shortness of breath.  CHEST - 2 VIEW  Comparison: Chest x-ray 11/05/2010.  Findings: Linear opacities in the lung bases bilaterally, similar to prior studies, compatible with mild chronic scarring. Lung volumes are normal.  No consolidative airspace disease.  No pleural effusions.  No pneumothorax.  No pulmonary nodule or mass noted. Pulmonary vasculature and the cardiomediastinal silhouette are within normal limits.   Surgical clips project over the right upper quadrant of the abdomen, compatible with prior cholecystectomy.  IMPRESSION: 1. No radiographic evidence of acute cardiopulmonary disease.   Original Report Authenticated By: Trudie Reed, M.D.    EKG: Independently reviewed. 73 beats per minute, left axis deviation, sinus rhythm, normal intervals, Q waves noted in inferior leads, no ST or T wave changes noted  Principal Problem:   Atypical chest pain Active Problems:   Diabetes type 2, controlled   Hypertension   Assessment/Plan Patient is a 62 year old female with past medical history most significant for diabetes and hypertension who comes in with chest pain associated with shortness of breath and sweating. The description of the chest pain seems consistent with unstable angina but the fact that pain is reproducible on palpation makes it atypical in character. With risk factors like diabetes and hypertension it seems reasonable The patient should be admitted and evaluated for coronary artery disease.  -Admit to telemetry -Cycle cardiac enzymes x3 -12-lead EKG in the -Consult cardiology for inpatient stress test -Start morphine, oxygen, aspirin, nitroglycerin when necessary -Start beta blocker, statin -Obtain 2-D echocardiogram -Continue home  medications -Lipid panel  Code Status:  for code  Family Communication:  none present at bedside  Disposition Plan/Anticipated LOS:  1-2 days  Time spent: 58  minutes  Lars Mage, MD  [facility] Team 5  If 7PM-7AM, please contact night-coverage at [email or web address], password [facility] 02/09/2013, 5:14 AM

## 2013-02-09 NOTE — Consult Note (Signed)
CARDIOLOGY CONSULT NOTE  Patient ID: [name], MRN: [id number], DOB/AGE: 03/16/1951 62 y.o. Admit date: 02/09/2013   Date of Consult: 02/09/2013 Primary Physician: No primary provider on file. Primary Cardiologist: Remotely >10 yrs stress test by Dr. Deborah Chalk  Chief Complaint: chest pain Reason for Consult: chest pain  HPI: [name] is a 62 y/o F with no prior cardiac history but a history of HTN, DM who presented to [facility] with an episode of chest pain. She reports a stress test over 10 yrs ago that was normal. She has had this similar chest pain once every 5-6 months for about 3 years, which has no pattern of precipitating factors. It can occur both at rest or with exertion. This episode was more concerning to her because of radiating pain. 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 She already had a low-grade chest tighness upon waking at 8pm last night. Around 11pm, she was standing in one spot working when she began to develop a sense of tightness across her chest with radiation to her L neck and L arm. She broke out into a cold sweat and got nauseated. She denies overt SOB, and no palpitations or syncope.  Nothing made this pain worse, including inspiration, palpation or movement. She sat down but did not feel better. She left work and drove herself to the ER. BP in ER ranged from 91/67 to 107/72. She was given 4 baby ASA with some improvement in symptoms but she still continued to have a low-grade constant discomfort until around 4am. She fell asleep at that time and when she woke up she felt much better.  She is not tachycardic, tachypnic or hypoxic. She did hurt her knee 2 weeks ago while walking the dog but this has since improved. No LEE. Troponins are negative x 2. CMET unremkarable except for K 3.2; CBC unremarkable, TSH wnl. CXR with mild chronic scarring at lung bases but otherwise without active disease. She does not exercise but does not routinely  have any SOB or CP with activity.  Past Medical History  Diagnosis Date  . Hypertension   . Diabetes mellitus without complication   . Depression       Most Recent Cardiac Studies: 2D Echo 2012 (done for syncope felt to be vasovagal at the time in setting of gastroenteritis) - Left ventricle: Wall thickness was increased in a pattern of mild LVH. The estimated ejection fraction was 65%. Wall motion was normal; there were no regional wall motion abnormalities. - Left atrium: The atrium was mildly dilated. - Right ventricle: The cavity size was normal. Systolic function was normal.   Surgical History:  Past Surgical History  Procedure Laterality Date  . Cholecystectomy    . Appendectomy    . Back surgery    . Vaginal hysterectomy       Home Meds: Prior to Admission medications   Medication Sig Start Date End Date Taking? Authorizing Provider  aspirin EC 81 MG tablet Take 81 mg by mouth daily.   Yes Historical Provider, MD  citalopram (CELEXA) 20 MG tablet Take 20 mg by mouth daily.   Yes Historical Provider, MD  levothyroxine (SYNTHROID, LEVOTHROID) 125 MCG tablet Take 125 mcg by mouth daily before breakfast.   Yes Historical Provider, MD  lisinopril-hydrochlorothiazide (PRINZIDE,ZESTORETIC) 10-12.5 MG per tablet Take 1 tablet by mouth daily.   Yes Historical Provider, MD    Inpatient Medications:  . aspirin EC  81 mg  Oral Daily  . atorvastatin  80 mg Oral q1800  . citalopram  20 mg Oral Daily  . enoxaparin (LOVENOX) injection  50 mg Subcutaneous Q24H  . lisinopril  10 mg Oral Daily   And  . hydrochlorothiazide  12.5 mg Oral Daily  . levothyroxine  125 mcg Oral QAC breakfast  . metoprolol tartrate  12.5 mg Oral BID  . [START ON 02/10/2013] pneumococcal 23 valent vaccine  0.5 mL Intramuscular Tomorrow-1000  . potassium chloride  40 mEq Oral Once  . sodium chloride  3 mL Intravenous Q12H  . sodium chloride  3 mL Intravenous Q12H      Allergies: No Known  Allergies  History   Social History  . Marital Status: Married    Spouse Name: N/A    Number of Children: N/A  . Years of Education: N/A   Occupational History  . Not on file.   Social History Main Topics  . Smoking status: Never Smoker   . Smokeless tobacco: Not on file  . Alcohol Use: No  . Drug Use: No  . Sexually Active: Not on file   Other Topics Concern  . Not on file   Social History Narrative  . No narrative on file     Family History  Problem Relation Age of Onset  . Throat cancer Brother   . Breast cancer Sister     sister died at 25  . Coronary artery disease Mother     stent age 42, died at age 63 of complications from diabetes/heart disease  . Diabetes Mother   . Colon cancer Father     died at 21  . Arrhythmia Brother      Review of Systems: General: negative for chills, fever, night sweats or weight changes.  Cardiovascular: see above Dermatological: negative for rash Respiratory: negative for cough or wheezing Urologic: negative for hematuria Abdominal: negative for nausea, vomiting, diarrhea, bright red blood per rectum, melena, or hematemesis Neurologic: negative for visual changes, syncope, or dizziness All other systems reviewed and are otherwise negative except as noted above.   Labs:  Recent Labs  02/09/13 0645  TROPONINI <0.30   Lab Results  Component Value Date   WBC 9.3 02/09/2013   HGB 13.3 02/09/2013   HCT 39.7 02/09/2013   MCV 91.5 02/09/2013   PLT 376 02/09/2013     Recent Labs Lab 02/09/13 0100  NA 138  K 3.2*  CL 101  CO2 28  BUN 13  CREATININE 0.91  CALCIUM 9.1  PROT 7.5  BILITOT 0.4  ALKPHOS 51  ALT 11  AST 13  GLUCOSE 126*   Lab Results  Component Value Date   CHOL 152 02/09/2013   HDL 34* 02/09/2013   LDLCALC 91 02/09/2013   TRIG 134 02/09/2013   Radiology/Studies:  Dg Chest 2 View 02/09/2013   *RADIOLOGY REPORT*  Clinical Data: Chest pain and pressure.  Shortness of breath.  CHEST - 2 VIEW   Comparison: Chest x-ray 11/05/2010.  Findings: Linear opacities in the lung bases bilaterally, similar to prior studies, compatible with mild chronic scarring. Lung volumes are normal.  No consolidative airspace disease.  No pleural effusions.  No pneumothorax.  No pulmonary nodule or mass noted. Pulmonary vasculature and the cardiomediastinal silhouette are within normal limits.   Surgical clips project over the right upper quadrant of the abdomen, compatible with prior cholecystectomy.  IMPRESSION: 1. No radiographic evidence of acute cardiopulmonary disease.   Original Report Authenticated By: Trudie Reed, M.D.  EKG: NSR 73bpm, left axis devation, poor R wave progression, no prior to compare to  Physical Exam: Blood pressure 129/74, pulse 65, temperature 97.9 F (36.6 C), temperature source Oral, resp. rate 18, height 5\' 5"  (1.651 m), weight 208 lb 8.9 oz (94.6 kg), SpO2 97.00%. General: Well developed, well nourished WF in no acute distress. Head: Normocephalic, atraumatic, sclera non-icteric, no xanthomas, nares are without discharge.  Neck: Negative for carotid bruits. JVD not elevated. Lungs: Clear bilaterally to auscultation without wheezes, rales, or rhonchi. Breathing is unlabored. Heart: RRR with S1 S2. No murmurs, rubs, or gallops appreciated. Abdomen: Soft, non-tender, non-distended with normoactive bowel sounds. No hepatomegaly. No rebound/guarding. No obvious abdominal masses. Msk:  Strength and tone appear normal for age. Extremities: No clubbing or cyanosis. No edema.  Distal pedal pulses are 2+ and equal bilaterally. Neuro: Alert and oriented X 3. No facial asymmetry. No focal deficit. Moves all extremities spontaneously. Psych:  Responds to questions appropriately with a normal affect.   Assessment and Plan:   1. Chest pain, atypical 2. HTN, controlled, on low side upon admission. 3. Diabetes mellitus, controlled 4. Hypokalemia, supplemented by primary team, likely  related to HCTZ. 5, Obesity BMI 34.8  Despite prolonged discomfort (8pm until 4am), troponins have remained reasurringly normal. Will need another set before fully ruling out - this has been ordered. She is chest pain free at present. 2D echo is also pending. In setting of cardiac risk factors of HTN, DM, and family history of CAD, recommend stress testing. In the setting of recent knee injury, favor pharmacologic stress testing in AM. Some of her symptoms sound like they may have been related to low blood pressure while standing stationary as BP in ED was down to 91 systolic. D/C HCTZ component of ACEI combo which should also help her hypokalemia. DC the metoprolol that has been ordered new in the hospital to get an idea of how pressures are running (can always re-add after stress test if abnormal). Follow BP. Continue ASA.  Signed, Ronie Spies PA-C 02/09/2013, 12:26 PM   I have personally seen and examined this patient with Ronie Spies, PA-C. I agree with the assessment and plan as outlined above. She has atypical chest pain that was prolonged for 8 hours with normal troponin levels and no acute EKG changes. Her risk factors for CAD include HTN, DM, FH of CAD. Will plan NPO at midnight and Lexiscan stress myoview in am to exclude ischemia. She is in agreement.   Yasmen Cortner 02/09/2013 12:51 PM

## 2013-02-09 NOTE — Progress Notes (Signed)
[facility] PROGRESS NOTE  MARCEE JACOBS NFA:[id number] DOB: [birth year] DOA: 02/09/2013 PCP: No primary provider on file.  Assessment/Plan: Principal Problem:   Atypical chest pain Active Problems:   Diabetes type 2, controlled   Hypertension    1. Chest pain: Patient presented with atypical chest pain, but with significant cardiovascular risk factors. 12-Lead EKG showed no acute ischemic changes, CXR is devoid of acute findings, and initial set of cardiac enzymes is negative. She will need stratification. Monitoring telemetrically, cycling cardiac enzymes. On low dose ASA and beta-blocker. She is s/p negative office stress test several years ago, performed by Dr Roger Shelter. Have consulted [place] cardiology. 2D Echocardiogram and lipid panel are pending. Pain-free overnight. 2. Costochondritis: Patient has localized left parasternal tenderness, which is reproducible, long-standing and distinct from symptoms described above 3. DM: Patient is said to have diabetes mellitus, but was not on any diabetic medications pre-admission, so this is presumed diet-controlled. Random blood glucose is normal at 126. HBA1C is pending.  4. HTN: BP is controlled at this time.  5. Depression: Stable.   Code Status: Full Code.  Family Communication:  Disposition Plan: To be determined.    Brief narrative: 62 year old female with history of diabetes, hypertension and depression. She works third shift, and as she was getting ready to go to work at 8 PM on 02/08/13, she started having chest pain, radiating to her neck and shoulder. This was a pressure-like sensation in the middle of the chest, 6-7/10 in intensity, no exacerbating or relieving factors, associated with shortness of breath and cold sweat. Her supervisor asked her to go to ER. Patient was given Aspirin in the ER, which relieved her pain from 6-7 to 3/10. She has had similar pains in the past, was told that this was due to costochondritis  and is s/p negative stress test many years ago. Patient is usually able to walk many blocks without any chest pain or shortness of breath, although she hurt her knee 2 weeks ago and has not been able to walk much. Admitted for further management.    Consultants:  Cardiology.   Procedures:  CXR.   Antibiotics:  N/A.   HPI/Subjective: Asymptomatic.   Objective: Vital signs in last 24 hours: Temp:  [97.9 F (36.6 C)-98.5 F (36.9 C)] 97.9 F (36.6 C) (06/20 0537) Pulse Rate:  [65-82] 65 (06/20 0537) Resp:  [12-20] 18 (06/20 0537) BP: (91-129)/(63-74) 129/74 mmHg (06/20 0537) SpO2:  [92 %-97 %] 97 % (06/20 0537) Weight:  [94.6 kg (208 lb 8.9 oz)] 94.6 kg (208 lb 8.9 oz) (06/20 0537) Weight change:     Intake/Output from previous day:       Physical Exam: General: Comfortable, alert, communicative, fully oriented, not short of breath at rest.  HEENT:  No clinical pallor, no jaundice, no conjunctival injection or discharge. Hydration is fair.  NECK:  Supple, JVP not seen, no carotid bruits, no palpable lymphadenopathy, no palpable goiter. CHEST:  Clinically clear to auscultation, no wheezes, no crackles. HEART:  Sounds 1 and 2 heard, normal, regular, no murmurs. ABDOMEN:  Moderately obese, soft, non-tender, no palpable organomegaly, no palpable masses, normal bowel sounds. GENITALIA:  Not examined. LOWER EXTREMITIES:  No pitting edema, palpable peripheral pulses. MUSCULOSKELETAL SYSTEM:  Generalized osteoarthritic changes, otherwise, normal. CENTRAL NERVOUS SYSTEM:  No focal neurologic deficit on gross examination.  Lab Results:  Recent Labs  02/09/13 0100  WBC 9.3  HGB 13.3  HCT 39.7  PLT 376    Recent Labs  02/09/13 0100  NA 138  K 3.2*  CL 101  CO2 28  GLUCOSE 126*  BUN 13  CREATININE 0.91  CALCIUM 9.1   No results found for this or any previous visit (from the past 240 hour(s)).   Studies/Results: Dg Chest 2 View  02/09/2013   *RADIOLOGY  REPORT*  Clinical Data: Chest pain and pressure.  Shortness of breath.  CHEST - 2 VIEW  Comparison: Chest x-ray 11/05/2010.  Findings: Linear opacities in the lung bases bilaterally, similar to prior studies, compatible with mild chronic scarring. Lung volumes are normal.  No consolidative airspace disease.  No pleural effusions.  No pneumothorax.  No pulmonary nodule or mass noted. Pulmonary vasculature and the cardiomediastinal silhouette are within normal limits.   Surgical clips project over the right upper quadrant of the abdomen, compatible with prior cholecystectomy.  IMPRESSION: 1. No radiographic evidence of acute cardiopulmonary disease.   Original Report Authenticated By: Trudie Reed, M.D.    Medications: Scheduled Meds: . aspirin EC  81 mg Oral Daily  . atorvastatin  80 mg Oral q1800  . citalopram  20 mg Oral Daily  . enoxaparin (LOVENOX) injection  50 mg Subcutaneous Q24H  . lisinopril  10 mg Oral Daily   And  . hydrochlorothiazide  12.5 mg Oral Daily  . levothyroxine  125 mcg Oral QAC breakfast  . metoprolol tartrate  12.5 mg Oral BID  . [START ON 02/10/2013] pneumococcal 23 valent vaccine  0.5 mL Intramuscular Tomorrow-1000  . sodium chloride  3 mL Intravenous Q12H  . sodium chloride  3 mL Intravenous Q12H   Continuous Infusions:  PRN Meds:.sodium chloride, morphine injection, nitroGLYCERIN, ondansetron (ZOFRAN) IV, ondansetron, sodium chloride    LOS: 0 days   Michole Lecuyer,CHRISTOPHER  [facility] Pager [phone]. If 8PM-8AM, please contact night-coverage at [email or web address], password [facility] 02/09/2013, 7:19 AM  LOS: 0 days

## 2013-02-09 NOTE — ED Provider Notes (Signed)
History     CSN: [id number]  Arrival date & time 02/09/13  0034   First MD Initiated Contact with Patient 02/09/13 0129      Chief Complaint  Patient presents with  . Chest Pain    (Consider location/radiation/quality/duration/timing/severity/associated sxs/prior treatment) HPI 62 year old female presents to emergency room with complaint of nausea, neck, jaw, and left shoulder pain, and shortness of breath.  Symptoms started around 10 PM, and she came to work, but steadily worsened.  She reports she got very diaphoretic.  Patient has history of hypertension, diet-controlled diabetes, obesity.  She reports mother had history of coronary disease, and had stent placed.  No prior Street of similar symptoms.  She is nonsmoker.  No leg swelling.  No palpitations.  She reports the nausea and sweating has resolved, but she still has some pain.  She reports she's been out of aspirin for last several days, but normally takes a baby aspirin a day.  Past Medical History  Diagnosis Date  . Hypertension   . Diabetes mellitus without complication   . Depression     Past Surgical History  Procedure Laterality Date  . Cholecystectomy    . Appendectomy    . Back surgery      No family history on file.  History  Substance Use Topics  . Smoking status: Never Smoker   . Smokeless tobacco: Not on file  . Alcohol Use: No    OB History   Grav Para Term Preterm Abortions TAB SAB Ect Mult Living                  Review of Systems  All other systems reviewed and are negative.    Allergies  Review of patient's allergies indicates no known allergies.  Home Medications   Current Outpatient Rx  Name  Route  Sig  Dispense  Refill  . aspirin EC 81 MG tablet   Oral   Take 81 mg by mouth daily.         . citalopram (CELEXA) 20 MG tablet   Oral   Take 20 mg by mouth daily.         Marland Kitchen levothyroxine (SYNTHROID, LEVOTHROID) 125 MCG tablet   Oral   Take 125 mcg by mouth daily before  breakfast.         . lisinopril-hydrochlorothiazide (PRINZIDE,ZESTORETIC) 10-12.5 MG per tablet   Oral   Take 1 tablet by mouth daily.           BP 107/72  Pulse 73  Temp(Src) 98.5 F (36.9 C)  Resp 17  SpO2 95%  Physical Exam  Nursing note and vitals reviewed. Constitutional: She is oriented to person, place, and time. She appears well-developed and well-nourished.  HENT:  Head: Normocephalic and atraumatic.  Nose: Nose normal.  Mouth/Throat: Oropharynx is clear and moist.  Eyes: Conjunctivae and EOM are normal. Pupils are equal, round, and reactive to light.  Neck: Normal range of motion. Neck supple. No JVD present. No tracheal deviation present. No thyromegaly present.  Cardiovascular: Normal rate, regular rhythm, normal heart sounds and intact distal pulses.  Exam reveals no gallop and no friction rub.   No murmur heard. Pulmonary/Chest: Effort normal and breath sounds normal. No stridor. No respiratory distress. She has no wheezes. She has no rales. She exhibits no tenderness.  Abdominal: Soft. Bowel sounds are normal. She exhibits no distension and no mass. There is no tenderness. There is no rebound and no guarding.  Musculoskeletal: Normal range  of motion. She exhibits no edema and no tenderness.  Lymphadenopathy:    She has no cervical adenopathy.  Neurological: She is alert and oriented to person, place, and time. She exhibits normal muscle tone. Coordination normal.  Skin: Skin is warm and dry. No rash noted. No erythema. No pallor.  Psychiatric: She has a normal mood and affect. Her behavior is normal. Judgment and thought content normal.    ED Course  Procedures (including critical care time)  Labs Reviewed  COMPREHENSIVE METABOLIC PANEL - Abnormal; Notable for the following:    Potassium 3.2 (*)    Glucose, Bld 126 (*)    Albumin 3.4 (*)    GFR calc non Af Amer 67 (*)    GFR calc Af Amer 78 (*)    All other components within normal limits  CBC WITH  DIFFERENTIAL  POCT I-STAT TROPONIN I   Dg Chest 2 View  02/09/2013   *RADIOLOGY REPORT*  Clinical Data: Chest pain and pressure.  Shortness of breath.  CHEST - 2 VIEW  Comparison: Chest x-ray 11/05/2010.  Findings: Linear opacities in the lung bases bilaterally, similar to prior studies, compatible with mild chronic scarring. Lung volumes are normal.  No consolidative airspace disease.  No pleural effusions.  No pneumothorax.  No pulmonary nodule or mass noted. Pulmonary vasculature and the cardiomediastinal silhouette are within normal limits.   Surgical clips project over the right upper quadrant of the abdomen, compatible with prior cholecystectomy.  IMPRESSION: 1. No radiographic evidence of acute cardiopulmonary disease.   Original Report Authenticated By: Trudie Reed, M.D.     Date: 02/09/2013  Rate: 73  Rhythm: normal sinus rhythm and premature atrial contractions (PAC)  QRS Axis: left  Intervals: normal  ST/T Wave abnormalities: normal  Conduction Disutrbances:none  Narrative Interpretation:   Old EKG Reviewed: unchanged   1. Chest pain       MDM  62 year old female with multiple risk factors for coronary disease, who presents with shortness of breath, diaphoresis, and left jaw, and shoulder pain.  Concern for ACS.  EKG without ST elevation, and initial troponin is negative.  Will discuss with hospitalist for chest pain off.        Olivia Mackie, MD 02/09/13 [phone]

## 2013-02-10 ENCOUNTER — Ambulatory Visit (HOSPITAL_COMMUNITY)
Admit: 2013-02-10 | Discharge: 2013-02-10 | Disposition: A | Discharge status: Home / Self Care | Payer: Self-pay | Attending: Cardiovascular Disease | Admitting: Cardiovascular Disease

## 2013-02-10 DIAGNOSIS — R079 Chest pain, unspecified: Secondary | ICD-10-CM

## 2013-02-10 DIAGNOSIS — E119 Type 2 diabetes mellitus without complications: Secondary | ICD-10-CM

## 2013-02-10 DIAGNOSIS — I10 Essential (primary) hypertension: Secondary | ICD-10-CM

## 2013-02-10 DIAGNOSIS — R0789 Other chest pain: Secondary | ICD-10-CM

## 2013-02-10 LAB — CBC
MCH: 29.2 pg (ref 26.0–34.0)
MCHC: 31.5 g/dL (ref 30.0–36.0)
MCV: 92.8 fL (ref 78.0–100.0)
Platelets: 350 10*3/uL (ref 150–400)
RDW: 13.1 % (ref 11.5–15.5)

## 2013-02-10 LAB — BASIC METABOLIC PANEL
Calcium: 9.2 mg/dL (ref 8.4–10.5)
Creatinine, Ser: 0.91 mg/dL (ref 0.50–1.10)
GFR calc Af Amer: 78 mL/min — ABNORMAL LOW (ref >90)

## 2013-02-10 MED ORDER — REGADENOSON 0.4 MG/5ML IV SOLN: 0.4000 mg | Freq: Once | INTRAVENOUS | Status: DC

## 2013-02-10 MED ORDER — TECHNETIUM TC 99M SESTAMIBI GENERIC - CARDIOLITE
30.0000 | Freq: Once | INTRAVENOUS | Status: AC | PRN
  Administered 2013-02-10: 30 via INTRAVENOUS

## 2013-02-10 MED ORDER — PANTOPRAZOLE SODIUM 40 MG PO TBEC: 40.0000 mg | DELAYED_RELEASE_TABLET | Freq: Every day | ORAL | Status: DC

## 2013-02-10 MED ORDER — TECHNETIUM TC 99M SESTAMIBI GENERIC - CARDIOLITE
10.0000 | Freq: Once | INTRAVENOUS | Status: AC | PRN
  Administered 2013-02-10: 10 via INTRAVENOUS

## 2013-02-10 MED ORDER — REGADENOSON 0.4 MG/5ML IV SOLN
INTRAVENOUS | Status: AC
  Administered 2013-02-10: 0.4 mg
  Filled 2013-02-10: qty 5

## 2013-02-10 NOTE — Progress Notes (Signed)
Patient ID: [name], female   DOB: [birth year], 62 y.o.   MRN: [id number]   SUBJECTIVE: Patient is not having any recurrent pain. She hopes to be a little home later today. It is my understanding that a stress nuclear scan has been scheduled. I've asked the nursing staff to be sure that it will be happening this morning.   Filed Vitals:   02/09/13 0537 02/09/13 1514 02/09/13 1530 02/10/13 0608  BP: 129/74 95/48 112/70 107/57  Pulse: 65 62  58  Temp: 97.9 F (36.6 C) 98.1 F (36.7 C)  97.7 F (36.5 C)  TempSrc: Oral Oral  Oral  Resp: 18 20  18   Height: 5\' 5"  (1.651 m)     Weight: 208 lb 8.9 oz (94.6 kg)     SpO2: 97% 97%  94%    Intake/Output Summary (Last 24 hours) at 02/10/13 0830 Last data filed at 02/09/13 1814  Gross per 24 hour  Intake    480 ml  Output      0 ml  Net    480 ml    LABS: Basic Metabolic Panel:  Recent Labs  19/14/78 0100 02/10/13 0440  NA 138 139  K 3.2* 5.0  CL 101 102  CO2 28 31  GLUCOSE 126* 121*  BUN 13 17  CREATININE 0.91 0.91  CALCIUM 9.1 9.2   Liver Function Tests:  Recent Labs  02/09/13 0100  AST 13  ALT 11  ALKPHOS 51  BILITOT 0.4  PROT 7.5  ALBUMIN 3.4*   No results found for this basename: LIPASE, AMYLASE,  in the last 72 hours CBC:  Recent Labs  02/09/13 0100 02/10/13 0440  WBC 9.3 9.6  NEUTROABS 4.4  --   HGB 13.3 12.9  HCT 39.7 41.0  MCV 91.5 92.8  PLT 376 350   Cardiac Enzymes:  Recent Labs  02/09/13 0645 02/09/13 1250 02/09/13 1827  TROPONINI <0.30 <0.30 <0.30   BNP: No components found with this basename: POCBNP,  D-Dimer: No results found for this basename: DDIMER,  in the last 72 hours Hemoglobin A1C:  Recent Labs  02/09/13 0645  HGBA1C 5.9*   Fasting Lipid Panel:  Recent Labs  02/09/13 0645  CHOL 152  HDL 34*  LDLCALC 91  TRIG 295  CHOLHDL 4.5   Thyroid Function Tests:  Recent Labs  02/09/13 0645  TSH 1.459    RADIOLOGY: Dg Chest 2 View  02/09/2013   *RADIOLOGY  REPORT*  Clinical Data: Chest pain and pressure.  Shortness of breath.  CHEST - 2 VIEW  Comparison: Chest x-ray 11/05/2010.  Findings: Linear opacities in the lung bases bilaterally, similar to prior studies, compatible with mild chronic scarring. Lung volumes are normal.  No consolidative airspace disease.  No pleural effusions.  No pneumothorax.  No pulmonary nodule or mass noted. Pulmonary vasculature and the cardiomediastinal silhouette are within normal limits.   Surgical clips project over the right upper quadrant of the abdomen, compatible with prior cholecystectomy.  IMPRESSION: 1. No radiographic evidence of acute cardiopulmonary disease.   Original Report Authenticated By: Trudie Reed, M.D.    PHYSICAL EXAM patient is oriented to person time and place. Affect is normal. There is no jugulovenous distention. Lungs are clear. Respiratory effort is nonlabored. Cardiac exam reveals S1 and S2. There no clicks or significant murmurs. There is no peripheral edema.   TELEMETRY: I have reviewed telemetry today February 10, 2013. There is normal sinus rhythm. There is some sinus bradycardia.   ASSESSMENT  AND PLAN:    Atypical chest pain     The patient is to have a nuclear stress study today. I've asked nursing to be sure that it will be happening. We will watch for the data to give feedback later in the day. If  The study is normal,  she'll be able to go home.    Diabetes type 2, controlled   Hypertension   The patient received one dose of potassium yesterday. Potassium is up to 5.0. I checked to be sure there were no other ongoing orders for further potassium.   Willa Rough 02/10/2013 8:30 AM

## 2013-02-10 NOTE — Discharge Summary (Signed)
Physician Discharge Summary  [name]:[id number] DOB: [birth year] DOA: 02/09/2013  PCP: No primary provider on file.  Admit date: 02/09/2013 Discharge date: 02/10/2013  Time spent: 40 minutes  Recommendations for Outpatient Follow-up:  1. Follow up with PMD.   Discharge Diagnoses:  Principal Problem:   Atypical chest pain Active Problems:   Diabetes type 2, controlled   Hypertension   Discharge Condition: Satisfactory.   Diet recommendation: Hear-Healthy/Carbohydrate-Modified.   Filed Weights   02/09/13 0537  Weight: 94.6 kg (208 lb 8.9 oz)    History of present illness:  62 year old female with history of diabetes, hypertension and depression. She works third shift, and as she was getting ready to go to work at 8 PM on 02/08/13, she started having chest pain, radiating to her neck and shoulder. This was a pressure-like sensation in the middle of the chest, 6-7/10 in intensity, no exacerbating or relieving factors, associated with shortness of breath and cold sweat. Her supervisor asked her to go to ER. Patient was given Aspirin in the ER, which relieved her pain from 6-7 to 3/10. She has had similar pains in the past, was told that this was due to costochondritis and is s/p negative stress test many years ago. Patient is usually able to walk many blocks without any chest pain or shortness of breath, although she hurt her knee 2 weeks ago and has not been able to walk much. Admitted for further management.    Hospital Course:  1. Chest pain: Patient presented with atypical chest pain, but with significant cardiovascular risk factors. 12-Lead EKG showed no acute ischemic changes, CXR was devoid of acute findings, and cardiac enzymes remained unelevated. Telemetric monitoring revealed no arrhythmias. On low dose ASA. Dr Melene Muller provided cardiology consultation, and patient underwent stress myoview on 02/10/13, which showed no reversible ischemia or infarction, normal  wall motion and left ventricular ejection fraction of 73%. Patient had no recurrence of chest pain during her hospitalization, and is now on low dose ASA. PPI has been added on discharge, as GERD is a possibility. Lipid profile showed TC 152, TG 134, HDL 34, LDL 91.   2. Costochondritis: Patient has had localized left parasternal tenderness, which is reproducible, long-standing and distinct from symptoms described above. 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 Patient has been reassured accordingly.  3. DM: Patient is said to have diabetes mellitus, but was not on any diabetic medications pre-admission, so this is presumed diet-controlled. Random blood glucose is normal at 126. HBA1C was 5.9.   4. HTN: BP remained controlled, during this hospitalization.  5. Depression: Stable.  6. Hypothyroidism: Patient was continued on pre-admission dose of synthroid. TSH was normal at 1.459.    Procedures:  See Below.   Stress Myoview 02/10/13.   Consultations:  Dr Melene Muller, cardiologist.   Discharge Exam: Filed Vitals:   02/09/13 1514 02/09/13 1530 02/10/13 0608 02/10/13 1225  BP: 95/48 112/70 107/57 127/63  Pulse: 62  58 60  Temp: 98.1 F (36.7 C)  97.7 F (36.5 C) 97.3 F (36.3 C)  TempSrc: Oral  Oral Oral  Resp: 20  18   Height:      Weight:      SpO2: 97%  94% 98%    General: Comfortable, alert, communicative, fully oriented, not short of breath at rest.  HEENT: No clinical pallor, no jaundice, no conjunctival injection or discharge. Hydration is fair.  NECK: Supple, JVP not seen, no carotid bruits, no palpable lymphadenopathy, no  palpable goiter.  CHEST: Clinically clear to auscultation, no wheezes, no crackles.  HEART: Sounds 1 and 2 heard, normal, regular, no murmurs.  ABDOMEN: Moderately obese, soft, non-tender, no palpable organomegaly, no palpable masses, normal bowel sounds.  GENITALIA: Not examined.  LOWER EXTREMITIES: No pitting  edema, palpable peripheral pulses.  MUSCULOSKELETAL SYSTEM: Generalized osteoarthritic changes, otherwise, normal.  CENTRAL NERVOUS SYSTEM: No focal neurologic deficit on gross examination.  Discharge Instructions      Discharge Orders   Future Orders Complete By Expires     Diet - low sodium heart healthy  As directed     Diet Carb Modified  As directed     Increase activity slowly  As directed         Medication List    TAKE these medications       aspirin EC 81 MG tablet  Take 81 mg by mouth daily.     citalopram 20 MG tablet  Commonly known as:  CELEXA  Take 20 mg by mouth daily.     levothyroxine 125 MCG tablet  Commonly known as:  SYNTHROID, LEVOTHROID  Take 125 mcg by mouth daily before breakfast.     lisinopril-hydrochlorothiazide 10-12.5 MG per tablet  Commonly known as:  PRINZIDE,ZESTORETIC  Take 1 tablet by mouth daily.     pantoprazole 40 MG tablet  Commonly known as:  PROTONIX  Take 1 tablet (40 mg total) by mouth daily at 6 (six) AM.  Start taking on:  02/11/2013       No Known Allergies Follow-up Information   Please follow up. (Follow up with primary MD. )        The results of significant diagnostics from this hospitalization (including imaging, microbiology, ancillary and laboratory) are listed below for reference.    Significant Diagnostic Studies: Dg Chest 2 View  02/09/2013   *RADIOLOGY REPORT*  Clinical Data: Chest pain and pressure.  Shortness of breath.  CHEST - 2 VIEW  Comparison: Chest x-ray 11/05/2010.  Findings: Linear opacities in the lung bases bilaterally, similar to prior studies, compatible with mild chronic scarring. Lung volumes are normal.  No consolidative airspace disease.  No pleural effusions.  No pneumothorax.  No pulmonary nodule or mass noted. Pulmonary vasculature and the cardiomediastinal silhouette are within normal limits.   Surgical clips project over the right upper quadrant of the abdomen, compatible with prior  cholecystectomy.  IMPRESSION: 1. No radiographic evidence of acute cardiopulmonary disease.   Original Report Authenticated By: Trudie Reed, M.D.   Nm Myocar Multi W/spect W/wall Motion / Ef  02/10/2013   *RADIOLOGY REPORT*  Clinical Data:  The 46-year-old female with chest pain, diabetes, and hypertension.  MYOCARDIAL IMAGING WITH SPECT (REST AND PHARMACOLOGIC-STRESS) GATED LEFT VENTRICULAR WALL MOTION STUDY LEFT VENTRICULAR EJECTION FRACTION  Technique:  Resting myocardial SPECT imaging was initially performed after intravenous administration of radiopharmaceutical. Myocardial SPECT was subsequently performed after additional radiopharmaceutical injection during pharmacologic-stress supervised by the Cardiology staff.  Quantitative gated imaging was also performed to evaluate left ventricular wall motion, and estimate left ventricular ejection fraction.  Radiopharmaceutical:  Tc-12m Cardiolite at rest and during stress.  Comparison: none  Findings:  Technique: Study is adequate.  Perfusion:  There are no relative decreased counts on stress or rest to suggest reversible ischemia or infarction.  Wall motion:  No focal wall motion abnormality.  Normal contractility.  Left ventricular ejection fraction:   = 73 %.  IMPRESSION:  1.  No reversible ischemia or infarction. 2.  Normal wall motion. 3.  Left ventricular ejection fraction equal73%   Original Report Authenticated By: Genevive Bi, M.D.    Microbiology: No results found for this or any previous visit (from the past 240 hour(s)).   Labs: Basic Metabolic Panel:  Recent Labs Lab 02/09/13 0100 02/10/13 0440  NA 138 139  K 3.2* 5.0  CL 101 102  CO2 28 31  GLUCOSE 126* 121*  BUN 13 17  CREATININE 0.91 0.91  CALCIUM 9.1 9.2   Liver Function Tests:  Recent Labs Lab 02/09/13 0100  AST 13  ALT 11  ALKPHOS 51  BILITOT 0.4  PROT 7.5  ALBUMIN 3.4*   No results found for this basename: LIPASE, AMYLASE,  in the last 168  hours No results found for this basename: AMMONIA,  in the last 168 hours CBC:  Recent Labs Lab 02/09/13 0100 02/10/13 0440  WBC 9.3 9.6  NEUTROABS 4.4  --   HGB 13.3 12.9  HCT 39.7 41.0  MCV 91.5 92.8  PLT 376 350   Cardiac Enzymes:  Recent Labs Lab 02/09/13 0645 02/09/13 1250 02/09/13 1827  TROPONINI <0.30 <0.30 <0.30   BNP: BNP (last 3 results) No results found for this basename: PROBNP,  in the last 8760 hours CBG: No results found for this basename: GLUCAP,  in the last 168 hours     Signed:  Leyani Gargus,CHRISTOPHER  [facility] 02/10/2013, 1:49 PM

## 2018-03-20 ENCOUNTER — Other Ambulatory Visit: Payer: Self-pay | Admitting: Family Medicine

## 2018-03-20 DIAGNOSIS — Z1231 Encounter for screening mammogram for malignant neoplasm of breast: Secondary | ICD-10-CM

## 2018-04-12 ENCOUNTER — Ambulatory Visit
Admission: RE | Admit: 2018-04-12 | Discharge: 2018-04-12 | Disposition: A | Discharge status: Home / Self Care | Payer: Medicare HMO | Source: Ambulatory Visit | Attending: Family Medicine | Admitting: Family Medicine

## 2018-04-12 DIAGNOSIS — Z1231 Encounter for screening mammogram for malignant neoplasm of breast: Secondary | ICD-10-CM

## 2018-10-18 ENCOUNTER — Emergency Department (HOSPITAL_COMMUNITY): Payer: Medicare HMO

## 2018-10-18 ENCOUNTER — Other Ambulatory Visit: Payer: Self-pay

## 2018-10-18 ENCOUNTER — Observation Stay (HOSPITAL_COMMUNITY)
Admission: EM | Admit: 2018-10-18 | Discharge: 2018-10-19 | Disposition: A | Discharge status: Home / Self Care | Payer: Medicare HMO | Attending: Internal Medicine | Admitting: Internal Medicine

## 2018-10-18 ENCOUNTER — Observation Stay (HOSPITAL_COMMUNITY): Payer: Medicare HMO

## 2018-10-18 DIAGNOSIS — E119 Type 2 diabetes mellitus without complications: Secondary | ICD-10-CM | POA: Insufficient documentation

## 2018-10-18 DIAGNOSIS — I10 Essential (primary) hypertension: Secondary | ICD-10-CM | POA: Insufficient documentation

## 2018-10-18 DIAGNOSIS — G459 Transient cerebral ischemic attack, unspecified: Principal | ICD-10-CM | POA: Insufficient documentation

## 2018-10-18 DIAGNOSIS — R531 Weakness: Secondary | ICD-10-CM | POA: Diagnosis present

## 2018-10-18 DIAGNOSIS — Z79899 Other long term (current) drug therapy: Secondary | ICD-10-CM | POA: Diagnosis not present

## 2018-10-18 DIAGNOSIS — F329 Major depressive disorder, single episode, unspecified: Secondary | ICD-10-CM | POA: Diagnosis present

## 2018-10-18 DIAGNOSIS — E785 Hyperlipidemia, unspecified: Secondary | ICD-10-CM | POA: Diagnosis present

## 2018-10-18 DIAGNOSIS — I679 Cerebrovascular disease, unspecified: Secondary | ICD-10-CM | POA: Diagnosis present

## 2018-10-18 DIAGNOSIS — F32A Depression, unspecified: Secondary | ICD-10-CM | POA: Diagnosis present

## 2018-10-18 HISTORY — DX: Hyperlipidemia, unspecified: E78.5

## 2018-10-18 LAB — APTT: APTT: 29 s (ref 24–36)

## 2018-10-18 LAB — DIFFERENTIAL
Abs Immature Granulocytes: 0.04 10*3/uL (ref 0.00–0.07)
BASOS PCT: 1 %
Basophils Absolute: 0.1 10*3/uL (ref 0.0–0.1)
EOS ABS: 0.2 10*3/uL (ref 0.0–0.5)
Eosinophils Relative: 2 %
Immature Granulocytes: 0 %
Lymphocytes Relative: 31 %
Lymphs Abs: 3 10*3/uL (ref 0.7–4.0)
Monocytes Absolute: 0.8 10*3/uL (ref 0.1–1.0)
Monocytes Relative: 9 %
NEUTROS PCT: 57 %
Neutro Abs: 5.5 10*3/uL (ref 1.7–7.7)

## 2018-10-18 LAB — COMPREHENSIVE METABOLIC PANEL
ALT: 12 U/L (ref 0–44)
ANION GAP: 8 (ref 5–15)
AST: 16 U/L (ref 15–41)
Albumin: 3.5 g/dL (ref 3.5–5.0)
Alkaline Phosphatase: 42 U/L (ref 38–126)
BUN: 28 mg/dL — ABNORMAL HIGH (ref 8–23)
CO2: 26 mmol/L (ref 22–32)
Calcium: 8.9 mg/dL (ref 8.9–10.3)
Chloride: 104 mmol/L (ref 98–111)
Creatinine, Ser: 0.79 mg/dL (ref 0.44–1.00)
GFR calc Af Amer: >60 mL/min (ref >60)
GFR calc non Af Amer: >60 mL/min (ref >60)
Glucose, Bld: 116 mg/dL — ABNORMAL HIGH (ref 70–99)
Potassium: 3.8 mmol/L (ref 3.5–5.1)
Sodium: 138 mmol/L (ref 135–145)
Total Bilirubin: 0.2 mg/dL — ABNORMAL LOW (ref 0.3–1.2)
Total Protein: 7.2 g/dL (ref 6.5–8.1)

## 2018-10-18 LAB — URINALYSIS, ROUTINE W REFLEX MICROSCOPIC
Bilirubin Urine: NEGATIVE
Glucose, UA: NEGATIVE mg/dL
Ketones, ur: NEGATIVE mg/dL
Nitrite: NEGATIVE
Protein, ur: NEGATIVE mg/dL
Specific Gravity, Urine: 1.013 (ref 1.005–1.030)
WBC, UA: >50 WBC/hpf — ABNORMAL HIGH (ref 0–5)
pH: 5 (ref 5.0–8.0)

## 2018-10-18 LAB — RAPID URINE DRUG SCREEN, HOSP PERFORMED
Amphetamines: NOT DETECTED
BENZODIAZEPINES: NOT DETECTED
Barbiturates: NOT DETECTED
Cocaine: NOT DETECTED
Opiates: NOT DETECTED
Tetrahydrocannabinol: NOT DETECTED

## 2018-10-18 LAB — CBC
HCT: 39.2 % (ref 36.0–46.0)
Hemoglobin: 12 g/dL (ref 12.0–15.0)
MCH: 29 pg (ref 26.0–34.0)
MCHC: 30.6 g/dL (ref 30.0–36.0)
MCV: 94.7 fL (ref 80.0–100.0)
Platelets: 404 10*3/uL — ABNORMAL HIGH (ref 150–400)
RBC: 4.14 MIL/uL (ref 3.87–5.11)
RDW: 12.7 % (ref 11.5–15.5)
WBC: 9.7 10*3/uL (ref 4.0–10.5)
nRBC: 0 % (ref 0.0–0.2)

## 2018-10-18 LAB — CBG MONITORING, ED: Glucose-Capillary: 108 mg/dL — ABNORMAL HIGH (ref 70–99)

## 2018-10-18 LAB — I-STAT CREATININE, ED: Creatinine, Ser: 0.8 mg/dL (ref 0.44–1.00)

## 2018-10-18 LAB — PROTIME-INR
INR: 1.1 (ref 0.8–1.2)
PROTHROMBIN TIME: 13.9 s (ref 11.4–15.2)

## 2018-10-18 LAB — ETHANOL

## 2018-10-18 MED ORDER — ENOXAPARIN SODIUM 40 MG/0.4ML ~~LOC~~ SOLN: 40.0000 mg | SUBCUTANEOUS | Status: DC

## 2018-10-18 MED ORDER — ASPIRIN 325 MG PO TABS
325.0000 mg | ORAL_TABLET | Freq: Every day | ORAL | Status: DC
  Administered 2018-10-19: 325 mg via ORAL
  Filled 2018-10-18 (×2): qty 1

## 2018-10-18 MED ORDER — ASPIRIN 300 MG RE SUPP: 300.0000 mg | Freq: Every day | RECTAL | Status: DC

## 2018-10-18 MED ORDER — ACETAMINOPHEN 160 MG/5ML PO SOLN: 650.0000 mg | ORAL | Status: DC | PRN

## 2018-10-18 MED ORDER — ASPIRIN EC 81 MG PO TBEC: 81.0000 mg | DELAYED_RELEASE_TABLET | Freq: Every day | ORAL | Status: DC

## 2018-10-18 MED ORDER — ACETAMINOPHEN 650 MG RE SUPP: 650.0000 mg | RECTAL | Status: DC | PRN

## 2018-10-18 MED ORDER — STROKE: EARLY STAGES OF RECOVERY BOOK
Freq: Once | Status: DC
  Filled 2018-10-18: qty 1

## 2018-10-18 MED ORDER — ACETAMINOPHEN 325 MG PO TABS: 650.0000 mg | ORAL_TABLET | ORAL | Status: DC | PRN

## 2018-10-18 NOTE — ED Notes (Signed)
LKW 2015  Pt called EMS with c/o weakness and not feeling well. Pt c/o weakness, stiffneck, bilateral blurred vision, HA. When EMS arrived pt had left arm drift, no peripheral vision on left side, and was hypertensive. Pt ambulated on scene, but was putting more weight on her right side which is not her normal.   Does not take blood thinners.

## 2018-10-18 NOTE — ED Notes (Signed)
SPOK paged @ 2135

## 2018-10-18 NOTE — Progress Notes (Signed)
Code stroke  Call time  2130 Beeper   2134 Exam start  2154 [facility]  2158 Soc   2158 Epic   2158 Radiologist  2200

## 2018-10-18 NOTE — Consult Note (Signed)
[facility] TeleNeurology Consult Services   Date of Service:   10/18/2018 22:13:13  Impression:     .  TIA r/o stroke  Comments: 68 YO F with h/o HTN and DM presented with an episode of blurred vision and left sided weakness which is now resolved. likely a TIA R/O stroke  Metrics: Last Known Well: 10/18/2018 20:15:00 [facility] Notification Time: 10/18/2018 22:13:13 Arrival Time: 10/18/2018 21:52:00 Stamp Time: 10/18/2018 22:13:13 Time First Login Attempt: 10/18/2018 22:21:24 Video Start Time: 10/18/2018 22:21:24  Symptoms: blurred vision and left sided weakness, headache NIHSS Start Assessment Time: 10/18/2018 22:23:39 Patient is not a candidate for tPA. Patient was not deemed candidate for tPA thrombolytics because of symptoms resolved. No residual disabling deficits. TPA was offered. . Video End Time: 10/18/2018 22:30:08  CT head showed no acute hemorrhage or acute core infarct.  Clinical Presentation is not Suggestive of Large Vessel Occlusive Disease, Patient is not a Candidate for Thrombectomy  ED Physician notified of diagnostic impression and management plan on 10/18/2018 22:30:09  Our recommendations are outlined below.  Recommendations:     .  Activate Stroke Protocol Admission/Order Set     .  Stroke/Telemetry Floor     .  Neuro Checks     .  Bedside Swallow Eval     .  DVT Prophylaxis     .  IV Fluids, Normal Saline     .  Head of Bed Below 30 Degrees     .  Euglycemia and Avoid Hyperthermia (PRN Acetaminophen)     .  Initiate Aspirin 81 MG Daily  Routine Consultation with Inhouse Neurology for Follow up Care  Sign Out:     .  Discussed with Emergency Department Provider    ------------------------------------------------------------------------------  History of Present Illness: Patient is a 68 year old Female.  Patient was brought by EMS for symptoms of blurred vision and left sided weakness, headache  68 YO F with h/o HTN and DM  presented with an episode of blurred vision and left sided weakness which is now resolved. Her symptoms lasted about an hour and are now resolved.  CT head showed no acute hemorrhage or acute core infarct.    Examination: 1A: Level of Consciousness - Alert; keenly responsive + 0 1B: Ask Month and Age - Both Questions Right + 0 1C: Blink Eyes & Squeeze Hands - Performs Both Tasks + 0 2: Test Horizontal Extraocular Movements - Normal + 0 3: Test Visual Fields - No Visual Loss + 0 4: Test Facial Palsy (Use Grimace if Obtunded) - Normal symmetry + 0 5A: Test Left Arm Motor Drift - No Drift for 10 Seconds + 0 5B: Test Right Arm Motor Drift - No Drift for 10 Seconds + 0 6A: Test Left Leg Motor Drift - No Drift for 5 Seconds + 0 6B: Test Right Leg Motor Drift - No Drift for 5 Seconds + 0 7: Test Limb Ataxia (FNF/Heel-Shin) - No Ataxia + 0 8: Test Sensation - Mild-Moderate Loss: Less Sharp/More Dull + 1 9: Test Language/Aphasia - Normal; No aphasia + 0 10: Test Dysarthria - Normal + 0 11: Test Extinction/Inattention - No abnormality + 0  NIHSS Score: 1  Patient was informed the Neurology Consult would happen via TeleHealth consult by way of interactive audio and video telecommunications and consented to receiving care in this manner.  Due to the immediate potential for life-threatening deterioration due to underlying acute neurologic illness, I spent 35 minutes providing critical care. This time  includes time for face to face visit via telemedicine, review of medical records, imaging studies and discussion of findings with providers, the patient and/or family.   Dr Venia Carbon   [facility] [phone]  Case [id number]

## 2018-10-18 NOTE — ED Provider Notes (Addendum)
[facility] EMERGENCY DEPARTMENT Provider Note   CSN: [id number] Arrival date & time: 10/18/18  2152  An emergency department physician performed an initial assessment on this suspected stroke patient at 2152.  History   Chief Complaint Chief Complaint  Patient presents with  . Code Stroke    HPI [name] is a 68 y.o. female.     Level 5 caveat for acuity of condition.  Patient presents with left arm weakness, blurred vision in right eye, and decreased left peripheral vision in the left eye at approximately 2015 this evening.  Symptoms have improved dramatically.  Past medical history includes hypertension, diabetes, depression.  No prodromal illnesses.     Past Medical History:  Diagnosis Date  . Depression   . Diabetes mellitus without complication   . Hypertension     Patient Active Problem List   Diagnosis Date Noted  . Atypical chest pain 02/09/2013  . Diabetes type 2, controlled (HCC) 02/09/2013  . Hypertension 02/09/2013    Past Surgical History:  Procedure Laterality Date  . APPENDECTOMY    . BACK SURGERY    . CHOLECYSTECTOMY    . VAGINAL HYSTERECTOMY       OB History   No obstetric history on file.      Home Medications    Prior to Admission medications   Medication Sig Start Date End Date Taking? Authorizing Provider  aspirin EC 81 MG tablet Take 81 mg by mouth daily.    [provider]  citalopram (CELEXA) 20 MG tablet Take 20 mg by mouth daily.    [provider]  levothyroxine (SYNTHROID, LEVOTHROID) 125 MCG tablet Take 125 mcg by mouth daily before breakfast.    [provider]  lisinopril-hydrochlorothiazide (PRINZIDE,ZESTORETIC) 10-12.5 MG per tablet Take 1 tablet by mouth daily.    [provider]  pantoprazole (PROTONIX) 40 MG tablet Take 1 tablet (40 mg total) by mouth daily at 6 (six) AM. 02/11/13   Oti, Osie Cheeks, MD    Family History Family History  Problem Relation Age of Onset  . Throat  cancer Brother   . Breast cancer Sister        sister died at 69  . Coronary artery disease Mother        stent age 11, died at age 16 of complications from diabetes/heart disease  . Diabetes Mother   . Colon cancer Father        died at 6  . Arrhythmia Brother     Social History Social History   Tobacco Use  . Smoking status: Never Smoker  Substance Use Topics  . Alcohol use: No  . Drug use: No     Allergies   Patient has no known allergies.   Review of Systems Review of Systems  Unable to perform ROS: Acuity of condition     Physical Exam Updated Vital Signs BP 131/71   Pulse 64   Temp 98.6 F (37 C) (Oral)   Resp 15   Ht 5' 5.5" (1.664 m)   Wt 81.6 kg   SpO2 93%   BMI 29.50 kg/m   Physical Exam Vitals signs and nursing note reviewed.  Constitutional:      Appearance: She is well-developed.  HENT:     Head: Normocephalic and atraumatic.  Eyes:     Conjunctiva/sclera: Conjunctivae normal.  Neck:     Musculoskeletal: Neck supple.  Cardiovascular:     Rate and Rhythm: Normal rate and regular rhythm.  Pulmonary:  Effort: Pulmonary effort is normal.     Breath sounds: Normal breath sounds.  Abdominal:     General: Bowel sounds are normal.     Palpations: Abdomen is soft.  Musculoskeletal: Normal range of motion.  Skin:    General: Skin is warm and dry.  Neurological:     General: No focal deficit present.     Mental Status: She is alert and oriented to person, place, and time.     Comments: Patient is now moving her left arm.  Psychiatric:        Behavior: Behavior normal.      ED Treatments / Results  Labs (all labs ordered are listed, but only abnormal results are displayed) Labs Reviewed  CBC - Abnormal; Notable for the following components:      Result Value   Platelets 404 (*)    All other components within normal limits  COMPREHENSIVE METABOLIC PANEL - Abnormal; Notable for the following components:   Glucose, Bld 116 (*)     BUN 28 (*)    Total Bilirubin 0.2 (*)    All other components within normal limits  CBG MONITORING, ED - Abnormal; Notable for the following components:   Glucose-Capillary 108 (*)    All other components within normal limits  ETHANOL  PROTIME-INR  APTT  DIFFERENTIAL  RAPID URINE DRUG SCREEN, HOSP PERFORMED  URINALYSIS, ROUTINE W REFLEX MICROSCOPIC  I-STAT CREATININE, ED    EKG None  Radiology Ct Head Code Stroke Wo Contrast`  Result Date: 10/18/2018 CLINICAL DATA:  Code stroke. Headache and LEFT-sided weakness, LEFT eye blindness. History of hypertension diabetes. EXAM: CT HEAD WITHOUT CONTRAST TECHNIQUE: Contiguous axial images were obtained from the base of the skull through the vertex without intravenous contrast. COMPARISON:  CT HEAD November 05, 2010 FINDINGS: BRAIN: No intraparenchymal hemorrhage, mass effect nor midline shift. No parenchymal brain volume loss for age. No hydrocephalus. Vague hypodensity RIGHT temporal lobe. No abnormal extra-axial fluid collections. Basal cisterns are patent. VASCULAR: Mild calcific atherosclerosis of the carotid siphons. SKULL: No skull fracture. No significant scalp soft tissue swelling. SINUSES/ORBITS: Trace paranasal sinus mucosal thickening. Mastoid air cells are well aerated.The included ocular globes and orbital contents are non-suspicious. OTHER: None. ASPECTS [facility] Stroke Program Early CT Score) - Ganglionic level infarction (caudate, lentiform nuclei, internal capsule, insula, M1-M3 cortex): 6 - Supraganglionic infarction (M4-M6 cortex): 3 Total score (0-10 with 10 being normal): 9 IMPRESSION: 1. Beam hardening artifact RIGHT temporal lobe, less likely infarct. Otherwise negative non-contrast CT HEAD for age. 2. ASPECTS is 9. 3. Critical Value/emergent results were called by telephone at the time of interpretation on 10/18/2018 at 10:08 pm to Dr. Donnetta Hutching , who verbally acknowledged these results. Electronically Signed   By: Awilda Metro  M.D.   On: 10/18/2018 22:08    Procedures Procedures (including critical care time)  Medications Ordered in ED Medications - No data to display   Initial Impression / Assessment and Plan / ED Course  I have reviewed the triage vital signs and the nursing notes.  Pertinent labs & imaging results that were available during my care of the patient were reviewed by me and considered in my medical decision making (see chart for details).        Code stroke initiated.  Stat CT head obtained.  2320: Patient rechecked.  She is feeling much better.  History and physical most consistent with TIA.  No acute interventions recommended.  Will admit for observation and further work-up tomorrow.  CRITICAL CARE Performed by: Donnetta Hutching Total critical care time: 30 minutes Critical care time was exclusive of separately billable procedures and treating other patients. Critical care was necessary to treat or prevent imminent or life-threatening deterioration. Critical care was time spent personally by me on the following activities: development of treatment plan with patient and/or surrogate as well as nursing, discussions with consultants, evaluation of patient's response to treatment, examination of patient, obtaining history from patient or surrogate, ordering and performing treatments and interventions, ordering and review of laboratory studies, ordering and review of radiographic studies, pulse oximetry and re-evaluation of patient's condition.  Final Clinical Impressions(s) / ED Diagnoses   Final diagnoses:  TIA (transient ischemic attack)    ED Discharge Orders    None       Donnetta Hutching, MD 10/18/18 2245    Donnetta Hutching, MD 10/18/18 2329

## 2018-10-18 NOTE — ED Triage Notes (Signed)
Pt brought in by [facility] for c/o headache and left side weakness and blindness to left eye; all sx resolved at this time; LNW was at 2015 this evening and pt started with a headache

## 2018-10-18 NOTE — H&P (Signed)
History and Physical    [name] MOQ:[id number] DOB: [birth year] DOA: 10/18/2018  PCP: Gwenlyn Found, MD   Patient coming from: Home.  I have personally briefly reviewed patient's old medical records in [facility]  Chief Complaint: Headache, left eye blindness and left-sided weakness  HPI: [name] is a 68 y.o. female with medical history significant of depression, type 2 diabetes, hypertension who is coming to the emergency department with complaints of transient left-sided headache, left-sided blindness and left sided weakness around 2015 in the evening. She states that all symptoms are resolved at this time.  She denies blurred vision, slurred speech, impaired language comprehension or gait abnormality.  No fever, chills, sore throat, dyspnea, wheezing, hemoptysis, chest pain, palpitations, dizziness, diaphoresis, PND, orthopnea or pitting edema lower extremities.  She recently had a mild gastroenteritis and UTI, but denies any current abdominal pain, nausea, emesis, diarrhea, constipation, melena or hematochezia.  No dysuria, frequency or hematuria.  No polyuria, polydipsia, polyphagia or blurred vision.  ED Course: Initial vital signs temperature 98.6 F, pulse 64, respiration 18, blood pressure 135/73 mmHg and O2 sat 95% on room air.  No meds were ordered by the ED.  Her work-up shows urinalysis with a hazy appearance, small hemoglobinuria, large leukocyte esterase, more than 50 WBC per hpf with rare bacteria.  UDS was negative.  CBC shows a white count of 9.7, hemoglobin 12.0 and platelets 404.  PT/INR/APTT within expected values.  Alcohol level is normal.  CMP shows a glucose of 116, BUN 128 and total bilirubin of 0.2 mg/dL, all other values are within normal limits.  Imaging: CT head without contrast.  There was beam hardening artifact on the right temporal lobe, less likely infarct.  Otherwise her CT head was negative  Review of Systems: As per HPI otherwise 10  point review of systems negative.  Past Medical History:  Diagnosis Date  . Depression   . Diabetes mellitus without complication   . Hypertension     Past Surgical History:  Procedure Laterality Date  . APPENDECTOMY    . BACK SURGERY    . CHOLECYSTECTOMY    . VAGINAL HYSTERECTOMY       reports that she has never smoked. She does not have any smokeless tobacco history on file. She reports that she does not drink alcohol or use drugs.  No Known Allergies  Family History  Problem Relation Age of Onset  . Throat cancer Brother   . Breast cancer Sister        sister died at 51  . Coronary artery disease Mother        stent age 67, died at age 28 of complications from diabetes/heart disease  . Diabetes Mother   . Colon cancer Father        died at 110  . Arrhythmia Brother    Prior to Admission medications   Medication Sig Start Date End Date Taking? Authorizing Provider  ALPRAZolam Prudy Feeler) 0.5 MG tablet Take 0.5 mg by mouth daily as needed for anxiety.  03/20/18  Yes [provider]  citalopram (CELEXA) 20 MG tablet Take 20 mg by mouth daily.   Yes [provider]  metFORMIN (GLUCOPHAGE-XR) 500 MG 24 hr tablet Take by mouth. 09/22/18 09/22/19 Yes [provider]  rosuvastatin (CRESTOR) 5 MG tablet  09/03/18  Yes [provider]  SYNTHROID 100 MCG tablet Take 100 mcg by mouth daily before breakfast.  10/10/18  Yes [provider]  nitrofurantoin,  macrocrystal-monohydrate, (MACROBID) 100 MG capsule Take 100 mg by mouth 2 (two) times daily. 7 day course starting on 10/10/2018 10/10/18   [provider]    Physical Exam: Vitals:   10/18/18 2230 10/18/18 2245 10/18/18 2300 10/18/18 2315  BP: 131/71 138/79 129/69 131/75  Pulse: 64 61 62 65  Resp: 15 15 15 19   Temp:      TempSrc:      SpO2: 93% 100% 94% 92%  Weight:      Height:        Constitutional: NAD, calm, comfortable Eyes: PERRL, lids and conjunctivae normal ENMT:  Mucous membranes are moist. Posterior pharynx clear of any exudate or lesions.  Neck: normal, supple, no masses, no thyromegaly Respiratory: clear to auscultation bilaterally, no wheezing, no crackles. Normal respiratory effort. No accessory muscle use.  Cardiovascular: Regular rate and rhythm, no murmurs / rubs / gallops. No extremity edema. 2+ pedal pulses. No carotid bruits.  Abdomen: Obese, soft, no tenderness, no masses palpated. No hepatosplenomegaly. Bowel sounds positive.  Musculoskeletal: no clubbing / cyanosis. Good ROM, no contractures. Normal muscle tone.  Skin: no rashes, lesions, ulcers on very limited dermatological examination. Neurologic: CN 2-12 grossly intact. Sensation intact, DTR normal. Strength 5/5 in all 4.  Nose to finger was normal.  There is no pronator drift. Psychiatric: Normal judgment and insight. Alert and oriented x 3. Normal mood.   Labs on Admission: I have personally reviewed following labs and imaging studies  CBC: Recent Labs  Lab 10/18/18 2207  WBC 9.7  NEUTROABS 5.5  HGB 12.0  HCT 39.2  MCV 94.7  PLT 404*   Basic Metabolic Panel: Recent Labs  Lab 10/18/18 2207 10/18/18 2217  NA 138  --   K 3.8  --   CL 104  --   CO2 26  --   GLUCOSE 116*  --   BUN 28*  --   CREATININE 0.79 0.80  CALCIUM 8.9  --    GFR: Estimated Creatinine Clearance: 73.8 mL/min (by C-G formula based on SCr of 0.8 mg/dL). Liver Function Tests: Recent Labs  Lab 10/18/18 2207  AST 16  ALT 12  ALKPHOS 42  BILITOT 0.2*  PROT 7.2  ALBUMIN 3.5   No results for input(s): LIPASE, AMYLASE in the last 168 hours. No results for input(s): AMMONIA in the last 168 hours. Coagulation Profile: Recent Labs  Lab 10/18/18 2207  INR 1.1   Cardiac Enzymes: No results for input(s): CKTOTAL, CKMB, CKMBINDEX, TROPONINI in the last 168 hours. BNP (last 3 results) No results for input(s): PROBNP in the last 8760 hours. HbA1C: No results for input(s): HGBA1C in the last 72  hours. CBG: Recent Labs  Lab 10/18/18 2209  GLUCAP 108*   Lipid Profile: No results for input(s): CHOL, HDL, LDLCALC, TRIG, CHOLHDL, LDLDIRECT in the last 72 hours. Thyroid Function Tests: No results for input(s): TSH, T4TOTAL, FREET4, T3FREE, THYROIDAB in the last 72 hours. Anemia Panel: No results for input(s): VITAMINB12, FOLATE, FERRITIN, TIBC, IRON, RETICCTPCT in the last 72 hours. Urine analysis:    Component Value Date/Time   COLORURINE YELLOW 11/05/2010 1716   APPEARANCEUR CLEAR 11/05/2010 1716   LABSPEC 1.022 11/05/2010 1716   PHURINE 6.5 11/05/2010 1716   GLUCOSEU NEGATIVE 11/05/2010 1716   HGBUR NEGATIVE 11/05/2010 1716   BILIRUBINUR NEGATIVE 11/05/2010 1716   KETONESUR NEGATIVE 11/05/2010 1716   PROTEINUR NEGATIVE 11/05/2010 1716   UROBILINOGEN 0.2 11/05/2010 1716   NITRITE NEGATIVE 11/05/2010 1716   LEUKOCYTESUR TRACE (A)  11/05/2010 1716    Radiological Exams on Admission: Ct Head Code Stroke Wo Contrast`  Result Date: 10/18/2018 CLINICAL DATA:  Code stroke. Headache and LEFT-sided weakness, LEFT eye blindness. History of hypertension diabetes. EXAM: CT HEAD WITHOUT CONTRAST TECHNIQUE: Contiguous axial images were obtained from the base of the skull through the vertex without intravenous contrast. COMPARISON:  CT HEAD November 05, 2010 FINDINGS: BRAIN: No intraparenchymal hemorrhage, mass effect nor midline shift. No parenchymal brain volume loss for age. No hydrocephalus. Vague hypodensity RIGHT temporal lobe. No abnormal extra-axial fluid collections. Basal cisterns are patent. VASCULAR: Mild calcific atherosclerosis of the carotid siphons. SKULL: No skull fracture. No significant scalp soft tissue swelling. SINUSES/ORBITS: Trace paranasal sinus mucosal thickening. Mastoid air cells are well aerated.The included ocular globes and orbital contents are non-suspicious. OTHER: None. ASPECTS [facility] Stroke Program Early CT Score) - Ganglionic level infarction (caudate,  lentiform nuclei, internal capsule, insula, M1-M3 cortex): 6 - Supraganglionic infarction (M4-M6 cortex): 3 Total score (0-10 with 10 being normal): 9 IMPRESSION: 1. Beam hardening artifact RIGHT temporal lobe, less likely infarct. Otherwise negative non-contrast CT HEAD for age. 2. ASPECTS is 9. 3. Critical Value/emergent results were called by telephone at the time of interpretation on 10/18/2018 at 10:08 pm to Dr. Donnetta Hutching , who verbally acknowledged these results. Electronically Signed   By: Awilda Metro M.D.   On: 10/18/2018 22:08    EKG: Independently reviewed.  Vent. rate 65 BPM PR interval * ms QRS duration 152 ms QT/QTc 509/530 ms P-R-T axes 95 -41 53 Sinus rhythm Nonspecific IVCD with LAD                     Assessment/Plan Principal Problem:   TIA (transient ischemic attack) Observation/telemetry. Frequent neuro checks. PT/OT/SLP evaluation. Check hemoglobin A1c. Check fasting lipids. Check carotid Doppler. Check echocardiogram. Check MRI/MRA to brain. Continue aspirin.  Active Problems:   Diabetes type 2, controlled (HCC) Carbohydrate modified diet. Continue metformin 500 mg p.o. daily. CBG monitoring regular insulin sliding scale. Check hemoglobin A1c.    Hypertension Not on medical therapy at home. In any case, allow permissive hypertension. Monitor blood pressure.    Depression Continue Celexa 20 mg p.o. daily. Continue alprazolam as needed.    Hyperlipidemia Continue rosuvastatin. Check fasting lipids.     DVT prophylaxis: Lovenox SQ. Code Status: Full code. Family Communication: Disposition Plan: Observation for TIA work-up. Consults called: Tele-neurology was consulted by the ED medical staff. Admission status: Observation/telemetry.   Bobette Mo MD [facility]  10/18/2018, 11:43 PM

## 2018-10-19 ENCOUNTER — Observation Stay (HOSPITAL_COMMUNITY): Payer: Medicare HMO

## 2018-10-19 ENCOUNTER — Encounter (HOSPITAL_COMMUNITY): Payer: Self-pay | Admitting: Internal Medicine

## 2018-10-19 ENCOUNTER — Observation Stay (HOSPITAL_BASED_OUTPATIENT_CLINIC_OR_DEPARTMENT_OTHER): Payer: Medicare HMO

## 2018-10-19 DIAGNOSIS — G459 Transient cerebral ischemic attack, unspecified: Secondary | ICD-10-CM | POA: Diagnosis not present

## 2018-10-19 DIAGNOSIS — I340 Nonrheumatic mitral (valve) insufficiency: Secondary | ICD-10-CM

## 2018-10-19 DIAGNOSIS — E119 Type 2 diabetes mellitus without complications: Secondary | ICD-10-CM | POA: Diagnosis not present

## 2018-10-19 DIAGNOSIS — Z79899 Other long term (current) drug therapy: Secondary | ICD-10-CM | POA: Diagnosis not present

## 2018-10-19 DIAGNOSIS — E785 Hyperlipidemia, unspecified: Secondary | ICD-10-CM

## 2018-10-19 DIAGNOSIS — I10 Essential (primary) hypertension: Secondary | ICD-10-CM | POA: Diagnosis not present

## 2018-10-19 DIAGNOSIS — I639 Cerebral infarction, unspecified: Secondary | ICD-10-CM

## 2018-10-19 HISTORY — DX: Cerebral infarction, unspecified: I63.9

## 2018-10-19 HISTORY — DX: Hyperlipidemia, unspecified: E78.5

## 2018-10-19 LAB — LIPID PANEL
Cholesterol: 93 mg/dL (ref 0–200)
HDL: 30 mg/dL — ABNORMAL LOW (ref >40)
LDL Cholesterol: 41 mg/dL (ref 0–99)
TRIGLYCERIDES: 111 mg/dL (ref <150)
Total CHOL/HDL Ratio: 3.1 RATIO
VLDL: 22 mg/dL (ref 0–40)

## 2018-10-19 LAB — ECHOCARDIOGRAM COMPLETE
Height: 65.5 in
Weight: 2880 oz

## 2018-10-19 LAB — CBG MONITORING, ED: Glucose-Capillary: 110 mg/dL — ABNORMAL HIGH (ref 70–99)

## 2018-10-19 LAB — HEMOGLOBIN A1C
Hgb A1c MFr Bld: 5.9 % — ABNORMAL HIGH (ref 4.8–5.6)
Mean Plasma Glucose: 122.63 mg/dL

## 2018-10-19 LAB — GLUCOSE, CAPILLARY: Glucose-Capillary: 97 mg/dL (ref 70–99)

## 2018-10-19 MED ORDER — LEVOTHYROXINE SODIUM 100 MCG PO TABS
100.0000 ug | ORAL_TABLET | Freq: Every day | ORAL | Status: DC
  Administered 2018-10-19: 100 ug via ORAL
  Filled 2018-10-19: qty 2

## 2018-10-19 MED ORDER — HEPARIN (PORCINE) 25000 UT/250ML-% IV SOLN: 1100.0000 [IU]/h | INTRAVENOUS | Status: DC

## 2018-10-19 MED ORDER — CITALOPRAM HYDROBROMIDE 20 MG PO TABS
20.0000 mg | ORAL_TABLET | Freq: Every day | ORAL | Status: DC
  Administered 2018-10-19: 20 mg via ORAL
  Filled 2018-10-19 (×3): qty 1

## 2018-10-19 MED ORDER — METFORMIN HCL ER 500 MG PO TB24
500.0000 mg | ORAL_TABLET | Freq: Every day | ORAL | Status: DC
  Filled 2018-10-19 (×3): qty 1

## 2018-10-19 MED ORDER — ROSUVASTATIN CALCIUM 10 MG PO TABS
5.0000 mg | ORAL_TABLET | Freq: Every day | ORAL | Status: DC
  Filled 2018-10-19 (×3): qty 1

## 2018-10-19 MED ORDER — HEPARIN BOLUS VIA INFUSION: 4000.0000 [IU] | Freq: Once | INTRAVENOUS | Status: DC

## 2018-10-19 MED ORDER — ALPRAZOLAM 0.5 MG PO TABS: 0.5000 mg | ORAL_TABLET | Freq: Every day | ORAL | Status: DC | PRN

## 2018-10-19 MED ORDER — ASPIRIN 325 MG PO TABS: 325.0000 mg | ORAL_TABLET | Freq: Every day | ORAL | 0 refills | Status: DC

## 2018-10-19 MED ORDER — INSULIN ASPART 100 UNIT/ML ~~LOC~~ SOLN: 0.0000 [IU] | Freq: Three times a day (TID) | SUBCUTANEOUS | Status: DC

## 2018-10-19 NOTE — Discharge Summary (Addendum)
Discharge Summary  [name] HUT:[id number] DOB: 03/15/1951  PCP: Gwenlyn Found, MD  Admit date: 10/18/2018 Discharge date: 10/19/2018  Time spent: 35 minutes   UPDATE: Patient was adamant about leaving the hospital on 10/19/18. Called her to her home several times and spoke with her on the phone in 2 separate occasions in the afternoon of 10/19/18.  Explained to her the risks of having Afib without anticoagulation. She refused to return to the hospital to be treated. She states she will call her PCP in the morning on 10/20/18 and will receive treatment there. The writer encouraged her to return to the ED if she changes her mind or see her PCP in the morning of 10/20/18 to be treated. Patient voices understanding and agrees to plan.  DC DELAYED DUE TO NEWLY DIAGNOSED A-FIB. CARDIOLOGY CONSULT AND INITIATION OF ANTICOAGULATION.  Recommendations for Outpatient Follow-up:  1. Follow up with your PCP 2. Take your medications as prescribed   Discharge Diagnoses:  Active Hospital Problems   Diagnosis Date Noted  . TIA (transient ischemic attack) 10/18/2018  . Hyperlipidemia 10/19/2018  . Depression 10/18/2018  . Diabetes type 2, controlled (HCC) 02/09/2013  . Hypertension 02/09/2013    Resolved Hospital Problems  No resolved problems to display.    Discharge Condition: Stable  Diet recommendation: Resume previous diet   Vitals:   10/19/18 0405 10/19/18 1027  BP: (!) 120/91 126/90  Pulse: (!) 101 100  Resp: 19 18  Temp:  97.9 F (36.6 C)  SpO2: 96% 95%    History of present illness:  [name] is a 68 y.o. female with medical history significant of depression, type 2 diabetes, hypertension who is coming to the emergency department with complaints of transient left-sided headache, left-sided blindness and left sided weakness around 2015 in the evening. She states that all symptoms are resolved at this time.  She denies blurred vision, slurred speech, impaired  language comprehension or gait abnormality.  No fever, chills, sore throat, dyspnea, wheezing, hemoptysis, chest pain, palpitations, dizziness, diaphoresis, PND, orthopnea or pitting edema lower extremities.  She recently had a mild gastroenteritis and UTI, but denies any current abdominal pain, nausea, emesis, diarrhea, constipation, melena or hematochezia.  No dysuria, frequency or hematuria.  No polyuria, polydipsia, polyphagia or blurred vision.  ED Course: Initial vital signs temperature 98.6 F, pulse 64, respiration 18, blood pressure 135/73 mmHg and O2 sat 95% on room air.  No meds were ordered by the ED.  Her work-up shows urinalysis with a hazy appearance, small hemoglobinuria, large leukocyte esterase, more than 50 WBC per hpf with rare bacteria.  UDS was negative.  CBC shows a white count of 9.7, hemoglobin 12.0 and platelets 404.  PT/INR/APTT within expected values.  Alcohol level is normal.  CMP shows a glucose of 116, BUN 128 and total bilirubin of 0.2 mg/dL, all other values are within normal limits.  Imaging: CT head without contrast.  There was beam hardening artifact on the right temporal lobe, less likely infarct.  Otherwise her CT head was negative  10/19/18: Patient seen and examined at her bedside. No acute events overnight. No new complaints. Her symptoms have resolved. She wants to go home.  Hospital Course:  Principal Problem:   TIA (transient ischemic attack) Active Problems:   Diabetes type 2, controlled (HCC)   Hypertension   Depression   Hyperlipidemia  TIA (transient ischemic attack) Observation/telemetry. No arrhythmia reported Frequent neuro checks. No new findings, no new changes PT/OT/SLP evaluation.  No PT follow up Check hemoglobin A1c. 5.9 with goal of less than 7.0 Check fasting lipids.LDL 41 with goal less than 70 Check carotid Doppler.No significant stenosis Check echocardiogram.Pending Check MRI/MRA to brain.Unremarkable for any acute  findings Continue aspirin and crestor.  Active Problems:  Newly diagnosed Afib Noted on 2D echo done on 10/19/18 Obtain 12 lead EKG CHADSVASC score 6 Will consult cardiology and start hep drip    Diabetes type 2, controlled (HCC) Carbohydrate modified diet. Continue metformin 500 mg p.o. daily. CBG monitoring regular insulin sliding scale. Check hemoglobin A1c >> 5.9   Hypertension Not on medical therapy at home. In any case, allow permissive hypertension. Monitor blood pressure.   Chronic Depression Continue Celexa 20 mg p.o. daily. Continue alprazolam as needed.   Hyperlipidemia Continue rosuvastatin. Check fasting lipids.  QTC prolongation Independently reviewed 12 lead EKG done on admission which revealed QTC 530 Avoid QTC prolonging agents Denies any cardiac symptoms     DVT prophylaxis: Lovenox SQ. Code Status: Full code. Family Communication: Disposition Plan: Observation for TIA work-up. Consults called: Tele-neurology was consulted by the ED medical staff. Admission status: Observation/telemetry.   Discharge Exam: BP 126/90 (BP Location: Right Arm)   Pulse 100   Temp 97.9 F (36.6 C) (Oral)   Resp 18   Ht 5' 5.5" (1.664 m)   Wt 81.6 kg   SpO2 95%   BMI 29.50 kg/m  . General: 68 y.o. year-old female well developed well nourished in no acute distress.  Alert and oriented x3. . Cardiovascular: Regular rate and rhythm with no rubs or gallops.  No thyromegaly or JVD noted.   Marland Kitchen. Respiratory: Clear to auscultation with no wheezes or rales. Good inspiratory effort. . Abdomen: Soft nontender nondistended with normal bowel sounds x4 quadrants. . Musculoskeletal: No lower extremity edema. 2/4 pulses in all 4 extremities. . Skin: No ulcerative lesions noted or rashes, . Psychiatry: Mood is appropriate for condition and setting  Discharge Instructions You were cared for by a hospitalist during your hospital stay. If you have any questions about  your discharge medications or the care you received while you were in the hospital after you are discharged, you can call the unit and asked to speak with the hospitalist on call if the hospitalist that took care of you is not available. Once you are discharged, your primary care physician will handle any further medical issues. Please note that NO REFILLS for any discharge medications will be authorized once you are discharged, as it is imperative that you return to your primary care physician (or establish a relationship with a primary care physician if you do not have one) for your aftercare needs so that they can reassess your need for medications and monitor your lab values.   Allergies as of 10/19/2018   No Known Allergies     Medication List    STOP taking these medications   nitrofurantoin (macrocrystal-monohydrate) 100 MG capsule Commonly known as:  MACROBID     TAKE these medications   ALPRAZolam 0.5 MG tablet Commonly known as:  XANAX Take 0.5 mg by mouth daily as needed for anxiety.   aspirin 325 MG tablet Take 1 tablet (325 mg total) by mouth daily. Start taking on:  October 20, 2018   citalopram 20 MG tablet Commonly known as:  CELEXA Take 20 mg by mouth daily.   metFORMIN 500 MG 24 hr tablet Commonly known as:  GLUCOPHAGE-XR Take by mouth.   rosuvastatin 5 MG tablet Commonly known  as:  CRESTOR   SYNTHROID 100 MCG tablet Generic drug:  levothyroxine Take 100 mcg by mouth daily before breakfast.      No Known Allergies Follow-up Information    Gwenlyn Found, MD. Call in 1 day(s).   Specialty:  Family Medicine Why:  Please call for a post hospital follow up appointment Contact information: [address] [place] [address] [phone]            The results of significant diagnostics from this hospitalization (including imaging, microbiology, ancillary and laboratory) are listed below for reference.    Significant Diagnostic  Studies: Dg Chest 2 View  Result Date: 10/19/2018 CLINICAL DATA:  Hypertension.  Weakness. EXAM: CHEST - 2 VIEW COMPARISON:  February 09, 2013 FINDINGS: There is stable scarring in the bases. There is no edema or consolidation. Heart size and pulmonary vascularity are normal. No adenopathy. There is degenerative change in the thoracic spine. IMPRESSION: Bibasilar scarring. No edema or consolidation. Stable cardiac silhouette. Electronically Signed   By: Bretta Bang III M.D.   On: 10/19/2018 08:49   Mr Brain Wo Contrast  Result Date: 10/19/2018 CLINICAL DATA:  TIA.  Left-sided weakness EXAM: MRI HEAD WITHOUT CONTRAST MRA HEAD WITHOUT CONTRAST TECHNIQUE: Multiplanar, multiecho pulse sequences of the brain and surrounding structures were obtained without intravenous contrast. Angiographic images of the head were obtained using MRA technique without contrast. COMPARISON:  CT head 10/18/2018 FINDINGS: MRI HEAD FINDINGS Brain: No definite acute infarct. 5 mm diffusion hyperintensity in the left frontal convexity seen only on coronal diffusion. Possible artifact. No significant chronic ischemia. Ventricle size normal. Negative for hemorrhage or mass. Vascular: Normal arterial flow voids Skull and upper cervical spine: Negative Sinuses/Orbits: Retention cyst left maxillary sinus. Mild mucosal thickening throughout the paranasal sinuses. Negative orbit Other: None MRA HEAD FINDINGS Both vertebral arteries widely patent. Basilar widely patent. PICA, superior cerebellar, and posterior cerebral arteries widely patent. Internal carotid artery widely patent bilaterally. Anterior and middle cerebral arteries patent without stenosis or occlusion. Decreased signal inferior division right MCA due to tortuosity. Negative for cerebral aneurysm. IMPRESSION: 1. No definite acute infarct. Tiny diffusion abnormality in the left frontal convexity felt to be artifact. Correlate with symptoms. 2. Negative MRA head Electronically  Signed   By: Marlan Palau M.D.   On: 10/19/2018 08:15   US Carotid Bilateral (at [facility] And [facility] Only)  Result Date: 10/19/2018 CLINICAL DATA:  68 year old female with a history of left-sided weakness EXAM: BILATERAL CAROTID DUPLEX ULTRASOUND TECHNIQUE: Wallace Cullens scale imaging, color Doppler and duplex ultrasound were performed of bilateral carotid and vertebral arteries in the neck. COMPARISON:  No prior duplex FINDINGS: Criteria: Quantification of carotid stenosis is based on velocity parameters that correlate the residual internal carotid diameter with NASCET-based stenosis levels, using the diameter of the distal internal carotid lumen as the denominator for stenosis measurement. The following velocity measurements were obtained: RIGHT ICA:  Systolic 76 cm/sec, Diastolic 27 cm/sec CCA:  66 cm/sec SYSTOLIC ICA/CCA RATIO:  1.1 ECA:  38 cm/sec LEFT ICA:  Systolic 57 cm/sec, Diastolic 20 cm/sec CCA:  77 cm/sec SYSTOLIC ICA/CCA RATIO:  0.8 ECA:  38 cm/sec Right Brachial SBP: Not acquired Left Brachial SBP: Not acquired RIGHT CAROTID ARTERY: No significant calcifications of the right common carotid artery. Intermediate waveform maintained. Heterogeneous and partially calcified plaque at the right carotid bifurcation. No significant lumen shadowing. Low resistance waveform of the right ICA. No significant tortuosity. RIGHT VERTEBRAL ARTERY: Antegrade flow with low resistance waveform.  LEFT CAROTID ARTERY: No significant calcifications of the left common carotid artery. Intermediate waveform maintained. Heterogeneous and partially calcified plaque at the left carotid bifurcation without significant lumen shadowing. Low resistance waveform of the left ICA. No significant tortuosity. LEFT VERTEBRAL ARTERY:  Antegrade flow with low resistance waveform. IMPRESSION: Color duplex indicates minimal heterogeneous and calcified plaque, with no hemodynamically significant stenosis by duplex criteria in the extracranial  cerebrovascular circulation. Signed, Yvone Neu. Reyne Dumas, RPVI [facility] Electronically Signed   By: Gilmer Mor D.O.   On: 10/19/2018 08:52   Mr Maxine Glenn Head/brain ZO Cm  Result Date: 10/19/2018 CLINICAL DATA:  TIA.  Left-sided weakness EXAM: MRI HEAD WITHOUT CONTRAST MRA HEAD WITHOUT CONTRAST TECHNIQUE: Multiplanar, multiecho pulse sequences of the brain and surrounding structures were obtained without intravenous contrast. Angiographic images of the head were obtained using MRA technique without contrast. COMPARISON:  CT head 10/18/2018 FINDINGS: MRI HEAD FINDINGS Brain: No definite acute infarct. 5 mm diffusion hyperintensity in the left frontal convexity seen only on coronal diffusion. Possible artifact. No significant chronic ischemia. Ventricle size normal. Negative for hemorrhage or mass. Vascular: Normal arterial flow voids Skull and upper cervical spine: Negative Sinuses/Orbits: Retention cyst left maxillary sinus. Mild mucosal thickening throughout the paranasal sinuses. Negative orbit Other: None MRA HEAD FINDINGS Both vertebral arteries widely patent. Basilar widely patent. PICA, superior cerebellar, and posterior cerebral arteries widely patent. Internal carotid artery widely patent bilaterally. Anterior and middle cerebral arteries patent without stenosis or occlusion. Decreased signal inferior division right MCA due to tortuosity. Negative for cerebral aneurysm. IMPRESSION: 1. No definite acute infarct. Tiny diffusion abnormality in the left frontal convexity felt to be artifact. Correlate with symptoms. 2. Negative MRA head Electronically Signed   By: Marlan Palau M.D.   On: 10/19/2018 08:15   Ct Head Code Stroke Wo Contrast`  Result Date: 10/18/2018 CLINICAL DATA:  Code stroke. Headache and LEFT-sided weakness, LEFT eye blindness. History of hypertension diabetes. EXAM: CT HEAD WITHOUT CONTRAST TECHNIQUE: Contiguous axial images  were obtained from the base of the skull through the vertex without intravenous contrast. COMPARISON:  CT HEAD November 05, 2010 FINDINGS: BRAIN: No intraparenchymal hemorrhage, mass effect nor midline shift. No parenchymal brain volume loss for age. No hydrocephalus. Vague hypodensity RIGHT temporal lobe. No abnormal extra-axial fluid collections. Basal cisterns are patent. VASCULAR: Mild calcific atherosclerosis of the carotid siphons. SKULL: No skull fracture. No significant scalp soft tissue swelling. SINUSES/ORBITS: Trace paranasal sinus mucosal thickening. Mastoid air cells are well aerated.The included ocular globes and orbital contents are non-suspicious. OTHER: None. ASPECTS [facility] Stroke Program Early CT Score) - Ganglionic level infarction (caudate, lentiform nuclei, internal capsule, insula, M1-M3 cortex): 6 - Supraganglionic infarction (M4-M6 cortex): 3 Total score (0-10 with 10 being normal): 9 IMPRESSION: 1. Beam hardening artifact RIGHT temporal lobe, less likely infarct. Otherwise negative non-contrast CT HEAD for age. 2. ASPECTS is 9. 3. Critical Value/emergent results were called by telephone at the time of interpretation on 10/18/2018 at 10:08 pm to Dr. Donnetta Hutching , who verbally acknowledged these results. Electronically Signed   By: Awilda Metro M.D.   On: 10/18/2018 22:08    Microbiology: No results found for this or any previous visit (from the past 240 hour(s)).   Labs: Basic Metabolic Panel: Recent Labs  Lab 10/18/18 2207 10/18/18 2217  NA 138  --   K 3.8  --   CL 104  --   CO2 26  --   GLUCOSE  116*  --   BUN 28*  --   CREATININE 0.79 0.80  CALCIUM 8.9  --    Liver Function Tests: Recent Labs  Lab 10/18/18 2207  AST 16  ALT 12  ALKPHOS 42  BILITOT 0.2*  PROT 7.2  ALBUMIN 3.5   No results for input(s): LIPASE, AMYLASE in the last 168 hours. No results for input(s): AMMONIA in the last 168 hours. CBC: Recent Labs  Lab 10/18/18 2207  WBC 9.7  NEUTROABS  5.5  HGB 12.0  HCT 39.2  MCV 94.7  PLT 404*   Cardiac Enzymes: No results for input(s): CKTOTAL, CKMB, CKMBINDEX, TROPONINI in the last 168 hours. BNP: BNP (last 3 results) No results for input(s): BNP in the last 8760 hours.  ProBNP (last 3 results) No results for input(s): PROBNP in the last 8760 hours.  CBG: Recent Labs  Lab 10/18/18 2209 10/19/18 0846 10/19/18 1126  GLUCAP 108* 110* 97       Signed:  Darlin Drop, MD [facility] 10/19/2018, 2:37 PM

## 2018-10-19 NOTE — Progress Notes (Signed)
SLP Cancellation Note  Patient Details Name: [name] MRN: [id number] DOB: 1951/04/19   Cancelled treatment:       Reason Eval/Treat Not Completed: SLP screened, no needs identified, will sign off; SLP screened Pt in room. Pt denies any changes in swallowing, speech, language, or cognition. MRI negative for acute changes. SLE will be deferred at this time. Reconsult if indicated. SLP will sign off.  Thank you,  Havery Moros, CCC-SLP [phone]    [name] 10/19/2018, 2:31 PM

## 2018-10-19 NOTE — ED Notes (Signed)
Patient transported to MRI 

## 2018-10-19 NOTE — Progress Notes (Signed)
*  PRELIMINARY RESULTS* Echocardiogram 2D Echocardiogram has been performed.  [name] 10/19/2018, 3:49 PM

## 2018-10-19 NOTE — Progress Notes (Signed)
ANTICOAGULATION CONSULT NOTE - Initial Consult  Pharmacy Consult for heparin dosing Indication:  New-onset atrial fibrillation  No Known Allergies  Patient Measurements: Height: 5' 5.5" (166.4 cm) Weight: 180 lb (81.6 kg) IBW/kg (Calculated) : 58.15 Heparin Dosing Weight: HEPARIN DW (KG): 75.4  Vital Signs: Temp: 98 F (36.7 C) (02/27 1443) Temp Source: Oral (02/27 1443) BP: 120/89 (02/27 1443) Pulse Rate: 81 (02/27 1443)  Labs: Recent Labs    10/18/18 2207 10/18/18 2217  HGB 12.0  --   HCT 39.2  --   PLT 404*  --   APTT 29  --   LABPROT 13.9  --   INR 1.1  --   CREATININE 0.79 0.80    Estimated Creatinine Clearance: 73.8 mL/min (by C-G formula based on SCr of 0.8 mg/dL).   Medical History: Past Medical History:  Diagnosis Date  . Depression   . Diabetes mellitus without complication (HCC)   . Hyperlipidemia 10/19/2018  . Hypertension     Medications:  Medications Prior to Admission  Medication Sig Dispense Refill Last Dose  . ALPRAZolam (XANAX) 0.5 MG tablet Take 0.5 mg by mouth daily as needed for anxiety.    unknown  . citalopram (CELEXA) 20 MG tablet Take 20 mg by mouth daily.   10/18/2018 at Unknown time  . metFORMIN (GLUCOPHAGE-XR) 500 MG 24 hr tablet Take by mouth.   10/18/2018 at Unknown time  . rosuvastatin (CRESTOR) 5 MG tablet    10/18/2018 at Unknown time  . SYNTHROID 100 MCG tablet Take 100 mcg by mouth daily before breakfast.    10/18/2018 at Unknown time  . nitrofurantoin, macrocrystal-monohydrate, (MACROBID) 100 MG capsule Take 100 mg by mouth 2 (two) times daily. 7 day course starting on 10/10/2018   Completed Course at Unknown time    Assessment: Pharmacy consulted to dose heparin infusion for this 68 yo female with new-onset atrial fibrillation.  She hasn't been on any previous anti-coagulants prior to admission.  Goal of Therapy:  Heparin level 0.3-0.7 units/ml Monitor platelets by anticoagulation protocol: Yes   Plan:  Give 4000 units  bolus x 1  Start  Heparin infusion at 1100 units/hr Obtain baseline heparin level and CBC Check heparin level in 6-8 hrs   Tama High, Pharm.D. Clinical Pharmacist 10/19/2018 5:05 PM

## 2018-10-19 NOTE — Progress Notes (Signed)
Dr Margo Aye notified of echo performed. Pt discharged w/ son.

## 2018-10-19 NOTE — Evaluation (Signed)
Physical Therapy Evaluation Patient Details Name: [name] MRN: [id number] DOB: [birth year] Today's Date: 10/19/2018   History of Present Illness  [name] is a 68 y.o. female with medical history significant of depression, type 2 diabetes, hypertension who is coming to the emergency department with complaints of transient left-sided headache, left-sided blindness and left sided weakness around 2015 in the evening. She states that all symptoms are resolved at this time.  She denies blurred vision, slurred speech, impaired language comprehension or gait abnormality.  No fever, chills, sore throat, dyspnea, wheezing, hemoptysis, chest pain, palpitations, dizziness, diaphoresis, PND, orthopnea or pitting edema lower extremities.  She recently had a mild gastroenteritis and UTI, but denies any current abdominal pain, nausea, emesis, diarrhea, constipation, melena or hematochezia.  No dysuria, frequency or hematuria.  No polyuria, polydipsia, polyphagia or blurred vision.    Clinical Impression  Patient functioning at baseline for functional mobility and gait. Patient states all symptoms have resolved.  Plan:  Patient discharged from physical therapy to care of nursing for ambulation daily as tolerated for length of stay.     Follow Up Recommendations No PT follow up    Equipment Recommendations  None recommended by PT    Recommendations for Other Services       Precautions / Restrictions Precautions Precautions: None Restrictions Weight Bearing Restrictions: No      Mobility  Bed Mobility Overal bed mobility: Independent                Transfers Overall transfer level: Independent                  Ambulation/Gait Ambulation/Gait assistance: Independent Gait Distance (Feet): 200 Feet Assistive device: None Gait Pattern/deviations: WFL(Within Functional Limits) Gait velocity: normal   General Gait Details: good return for ambulaton on level, inclined and  declined surfaces without loss of balance  Stairs            Wheelchair Mobility    Modified Rankin (Stroke Patients Only)       Balance Overall balance assessment: Independent                                           Pertinent Vitals/Pain Pain Assessment: No/denies pain    Home Living Family/patient expects to be discharged to:: Private residence Living Arrangements: Other relatives Available Help at Discharge: Family Type of Home: House Home Access: Ramped entrance     Home Layout: One level Home Equipment: None      Prior Function Level of Independence: Independent         Comments: Tourist information centre manager, drives     Higher education careers adviser        Extremity/Trunk Assessment   Upper Extremity Assessment Upper Extremity Assessment: Overall WFL for tasks assessed    Lower Extremity Assessment Lower Extremity Assessment: Overall WFL for tasks assessed    Cervical / Trunk Assessment Cervical / Trunk Assessment: Normal  Communication   Communication: No difficulties  Cognition Arousal/Alertness: Awake/alert Behavior During Therapy: WFL for tasks assessed/performed Overall Cognitive Status: Within Functional Limits for tasks assessed                                        General Comments      Exercises  Assessment/Plan    PT Assessment Patent does not need any further PT services  PT Problem List         PT Treatment Interventions      PT Goals (Current goals can be found in the Care Plan section)  Acute Rehab PT Goals Patient Stated Goal: return home PT Goal Formulation: With patient Time For Goal Achievement: 10/19/18 Potential to Achieve Goals: Good    Frequency     Barriers to discharge        Co-evaluation               AM-PAC PT "6 Clicks" Mobility  Outcome Measure Help needed turning from your back to your side while in a flat bed without using bedrails?: None Help needed moving  from lying on your back to sitting on the side of a flat bed without using bedrails?: None Help needed moving to and from a bed to a chair (including a wheelchair)?: None Help needed standing up from a chair using your arms (e.g., wheelchair or bedside chair)?: None Help needed to walk in hospital room?: None Help needed climbing 3-5 steps with a railing? : None 6 Click Score: 24    End of Session   Activity Tolerance: Patient tolerated treatment well Patient left: in bed;with call bell/phone within reach Nurse Communication: Mobility status PT Visit Diagnosis: Unsteadiness on feet (R26.81);Other abnormalities of gait and mobility (R26.89);Muscle weakness (generalized) (M62.81)    Time: 1024-1040 PT Time Calculation (min) (ACUTE ONLY): 16 min   Charges:   PT Evaluation $PT Eval Low Complexity: 1 Low PT Treatments $Gait Training: 8-22 mins        1:54 PM, 10/19/18 Ocie Bob, MPT Physical Therapist with [facility] 336 [phone] office [phone] mobile phone

## 2018-10-19 NOTE — Care Management Obs Status (Signed)
MEDICARE OBSERVATION STATUS NOTIFICATION   Patient Details  Name: [name] MRN: [id number] Date of Birth: 06/29/1951   Medicare Observation Status Notification Given:  Yes    Judyth Demarais, Chrystine Oiler, RN 10/19/2018, 12:45 PM

## 2018-10-19 NOTE — Progress Notes (Signed)
DC instructions given w/ FU appointment, med changes, diet and activity level. IV discontinued. Discontinued tele. No distress noted. Denies SOB, nausea, dizziness, or pain. Will continue to monitor until dc.

## 2018-10-19 NOTE — Discharge Instructions (Signed)
Stroke Prevention Some medical conditions and lifestyle choices can lead to a higher risk for a stroke. You can help to prevent a stroke by making nutrition, lifestyle, and other changes. What nutrition changes can be made?   Eat healthy foods. ? Choose foods that are high in fiber. These include:  Fresh fruits.  Fresh vegetables.  Whole grains. ? Eat at least 5 or more servings of fruits and vegetables each day. Try to fill half of your plate at each meal with fruits and vegetables. ? Choose lean protein foods. These include:  Lowfat (lean) cuts of meat.  Chicken without skin.  Fish.  Tofu.  Beans.  Nuts. ? Eat low-fat dairy products. ? Avoid foods that:  Are high in salt (sodium).  Have saturated fat.  Have trans fat.  Have cholesterol.  Are processed.  Are premade.  Follow eating guidelines as told by your doctor. These may include: ? Reducing how many calories you eat and drink each day. ? Limiting how much salt you eat or drink each day to 1,500 milligrams (mg). ? Using only healthy fats for cooking. These include:  Olive oil.  Canola oil.  Sunflower oil. ? Counting how many carbohydrates you eat and drink each day. What lifestyle changes can be made?  Try to stay at a healthy weight. Talk to your doctor about what a good weight is for you.  Get at least 30 minutes of moderate physical activity at least 5 days a week. This can include: ? Fast walking. ? Biking. ? Swimming.  Do not use any products that have nicotine or tobacco. This includes cigarettes and e-cigarettes. If you need help quitting, ask your doctor. Avoid being around tobacco smoke in general.  Limit how much alcohol you drink to no more than 1 drink a day for nonpregnant women and 2 drinks a day for men. One drink equals 12 oz of beer, 5 oz of wine, or 1 oz of hard liquor.  Do not use drugs.  Avoid taking birth control pills. Talk to your doctor about the risks of taking birth  control pills if: ? You are over 55 years old. ? You smoke. ? You get migraines. ? You have had a blood clot. What other changes can be made?  Manage your cholesterol. ? It is important to eat a healthy diet. ? If your cholesterol cannot be managed through your diet, you may also need to take medicines. Take medicines as told by your doctor.  Manage your diabetes. ? It is important to eat a healthy diet and to exercise regularly. ? If your blood sugar cannot be managed through diet and exercise, you may need to take medicines. Take medicines as told by your doctor.  Control your high blood pressure (hypertension). ? Try to keep your blood pressure below 130/80. This can help lower your risk of stroke. ? It is important to eat a healthy diet and to exercise regularly. ? If your blood pressure cannot be managed through diet and exercise, you may need to take medicines. Take medicines as told by your doctor. ? Ask your doctor if you should check your blood pressure at home. ? Have your blood pressure checked every year. Do this even if your blood pressure is normal.  Talk to your doctor about getting checked for a sleep disorder. Signs of this can include: ? Snoring a lot. ? Feeling very tired.  Take over-the-counter and prescription medicines only as told by your doctor. These may  include aspirin or blood thinners (antiplatelets or anticoagulants).  Make sure that any other medical conditions you have are managed. Where to find more information  American Stroke Association: www.strokeassociation.org  National Stroke Association: www.stroke.org Get help right away if:  You have any symptoms of stroke. "BE FAST" is an easy way to remember the main warning signs: ? B - Balance. Signs are dizziness, sudden trouble walking, or loss of balance. ? E - Eyes. Signs are trouble seeing or a sudden change in how you see. ? F - Face. Signs are sudden weakness or loss of feeling of the face,  or the face or eyelid drooping on one side. ? A - Arms. Signs are weakness or loss of feeling in an arm. This happens suddenly and usually on one side of the body. ? S - Speech. Signs are sudden trouble speaking, slurred speech, or trouble understanding what people say. ? T - Time. Time to call emergency services. Write down what time symptoms started.  You have other signs of stroke, such as: ? A sudden, very bad headache with no known cause. ? Feeling sick to your stomach (nausea). ? Throwing up (vomiting). ? Jerky movements you cannot control (seizure). These symptoms may represent a serious problem that is an emergency. Do not wait to see if the symptoms will go away. Get medical help right away. Call your local emergency services (911 in the [place] Do not drive yourself to the hospital. Summary  You can prevent a stroke by eating healthy, exercising, not smoking, drinking less alcohol, and treating other health problems, such as diabetes, high blood pressure, or high cholesterol.  Do not use any products that contain nicotine or tobacco, such as cigarettes and e-cigarettes.  Get help right away if you have any signs or symptoms of a stroke. This information is not intended to replace advice given to you by your health care provider. Make sure you discuss any questions you have with your health care provider. Document Released: 02/08/2012 Document Revised: 11/10/2016 Document Reviewed: 11/10/2016 Elsevier Interactive Patient Education  2019 [employer]   Transient Ischemic Attack A transient ischemic attack (TIA) is a "warning stroke" that causes stroke-like symptoms that go away quickly. A TIA does not cause lasting damage to the brain. But having a TIA is a sign that you may be at risk for a stroke. Lifestyle changes and medical treatments can help prevent a stroke. It is important to know the symptoms of a TIA and what to do. Get help right away, even if your symptoms go away. The  symptoms of a TIA are the same as those of a stroke. They can happen fast, and they usually go away within minutes or hours. They can include:  Weakness or loss of feeling in your face, arm, or leg. This often happens on one side of your body.  Trouble walking.  Trouble moving your arms or legs.  Trouble talking or understanding what people are saying.  Trouble seeing.  Seeing two of one object (double vision).  Feeling dizzy.  Feeling confused.  Loss of balance or coordination.  Feeling sick to your stomach (nauseous) and throwing up (vomiting).  A very bad headache for no reason.  What increases the risk? Certain things may make you more likely to have a TIA. Some of these are things that you can change, such as:  Being very overweight (obese).  Using products that contain nicotine or tobacco, such as cigarettes and e-cigarettes.  Taking birth  control pills.  Not being active.  Drinking too much alcohol.  Using drugs. Other risk factors include:  Having an irregular heartbeat (atrial fibrillation).  Being African American or Hispanic.  Having had blood clots, stroke, TIA, or heart attack in the past.  Being a woman with a history of high blood pressure in pregnancy (preeclampsia).  Being over the age of 12.  Being female.  Having family history of stroke.  Having the following diseases or conditions: ? High blood pressure. ? High cholesterol. ? Diabetes. ? Heart disease. ? Sickle cell disease. ? Sleep apnea. ? Migraine headache. ? Long-term (chronic) diseases that cause soreness and swelling (inflammation). ? Disorders that affect how your blood clots. Follow these instructions at home: Medicines   Take over-the-counter and prescription medicines only as told by your doctor.  If you were told to take aspirin or another medicine to thin your blood, take it exactly as told by your doctor. ? Taking too much of the medicine can cause  bleeding. ? Taking too little of the medicine may not work to treat the problem. Eating and drinking   Eat 5 or more servings of fruits and vegetables each day.  Follow instructions from your doctor about your diet. You may need to follow a certain diet to help lower your risk of having a stroke. You may need to: ? Eat a diet that is low in fat and salt. ? Eat foods that contain a lot of fiber. ? Limit the amount of carbohydrates and sugar in your diet.  Limit alcohol intake to 1 drink a day for nonpregnant women and 2 drinks a day for men. One drink equals 12 oz of beer, 5 oz of wine, or 1 oz of hard liquor. General instructions  Keep a healthy weight.  Stay active. Try to get at least 30 minutes of activity on all or most days.  Find out if you have a condition called sleep apnea. Get treatment if needed.  Do not use any products that contain nicotine or tobacco, such as cigarettes and e-cigarettes. If you need help quitting, ask your doctor.  Do not abuse drugs.  Keep all follow-up visits as told by your doctor. This is important. Get help right away if:  You have any signs of stroke. "BE FAST" is an easy way to remember the main warning signs: ? B - Balance. Signs are dizziness, sudden trouble walking, or loss of balance. ? E - Eyes. Signs are trouble seeing or a sudden change in how you see. ? F - Face. Signs are sudden weakness or loss of feeling of the face, or the face or eyelid drooping on one side. ? A - Arms. Signs are weakness or loss of feeling in an arm. This happens suddenly and usually on one side of the body. ? S - Speech. Signs are sudden trouble speaking, slurred speech, or trouble understanding what people say. ? T - Time. Time to call emergency services. Write down what time symptoms started.  You have other signs of stroke, such as: ? A sudden, very bad headache with no known cause. ? Feeling sick to your stomach (nausea). ? Throwing up  (vomiting). ? Jerky movements that you cannot control (seizure). These symptoms may be an emergency. Do not wait to see if the symptoms will go away. Get medical help right away. Call your local emergency services (911 in the [place] Do not drive yourself to the hospital. Summary  A transient ischemic attack (  TIA) is a "warning stroke" that causes stroke-like symptoms that go away quickly.  A TIA is a medical emergency. Get help right away, even if your symptoms go away.  A TIA does not cause lasting damage to the brain.  Having a TIA is a sign that you may be at risk for a stroke. Lifestyle changes and medical treatments can help prevent a stroke. This information is not intended to replace advice given to you by your health care provider. Make sure you discuss any questions you have with your health care provider. Document Released: 05/18/2008 Document Revised: 02/14/2018 Document Reviewed: 11/10/2016 Elsevier Interactive Patient Education  2019 [employer].

## 2018-10-20 LAB — HIV ANTIBODY (ROUTINE TESTING W REFLEX): HIV Screen 4th Generation wRfx: NONREACTIVE

## 2018-10-23 ENCOUNTER — Emergency Department (HOSPITAL_COMMUNITY)
Admission: EM | Admit: 2018-10-23 | Discharge: 2018-10-23 | Disposition: A | Discharge status: Home / Self Care | Payer: Medicare HMO | Attending: Emergency Medicine | Admitting: Emergency Medicine

## 2018-10-23 ENCOUNTER — Emergency Department (HOSPITAL_COMMUNITY): Payer: Medicare HMO

## 2018-10-23 ENCOUNTER — Encounter (HOSPITAL_COMMUNITY): Payer: Self-pay

## 2018-10-23 ENCOUNTER — Other Ambulatory Visit: Payer: Self-pay

## 2018-10-23 DIAGNOSIS — E119 Type 2 diabetes mellitus without complications: Secondary | ICD-10-CM | POA: Insufficient documentation

## 2018-10-23 DIAGNOSIS — Z7982 Long term (current) use of aspirin: Secondary | ICD-10-CM | POA: Insufficient documentation

## 2018-10-23 DIAGNOSIS — I10 Essential (primary) hypertension: Secondary | ICD-10-CM | POA: Diagnosis not present

## 2018-10-23 DIAGNOSIS — R519 Headache, unspecified: Secondary | ICD-10-CM

## 2018-10-23 DIAGNOSIS — R51 Headache: Secondary | ICD-10-CM | POA: Diagnosis not present

## 2018-10-23 DIAGNOSIS — Z79899 Other long term (current) drug therapy: Secondary | ICD-10-CM | POA: Insufficient documentation

## 2018-10-23 HISTORY — DX: Cerebral infarction, unspecified: I63.9

## 2018-10-23 LAB — COMPREHENSIVE METABOLIC PANEL
ALT: 15 U/L (ref 0–44)
AST: 16 U/L (ref 15–41)
Albumin: 3.7 g/dL (ref 3.5–5.0)
Alkaline Phosphatase: 43 U/L (ref 38–126)
Anion gap: 7 (ref 5–15)
BUN: 23 mg/dL (ref 8–23)
CO2: 25 mmol/L (ref 22–32)
Calcium: 9.3 mg/dL (ref 8.9–10.3)
Chloride: 108 mmol/L (ref 98–111)
Creatinine, Ser: 0.92 mg/dL (ref 0.44–1.00)
GFR calc Af Amer: >60 mL/min (ref >60)
GFR calc non Af Amer: >60 mL/min (ref >60)
Glucose, Bld: 108 mg/dL — ABNORMAL HIGH (ref 70–99)
Potassium: 4.2 mmol/L (ref 3.5–5.1)
Sodium: 140 mmol/L (ref 135–145)
Total Bilirubin: 0.7 mg/dL (ref 0.3–1.2)
Total Protein: 7.8 g/dL (ref 6.5–8.1)

## 2018-10-23 LAB — CBC
HCT: 43 % (ref 36.0–46.0)
Hemoglobin: 13.4 g/dL (ref 12.0–15.0)
MCH: 29.4 pg (ref 26.0–34.0)
MCHC: 31.2 g/dL (ref 30.0–36.0)
MCV: 94.3 fL (ref 80.0–100.0)
NRBC: 0 % (ref 0.0–0.2)
Platelets: 455 10*3/uL — ABNORMAL HIGH (ref 150–400)
RBC: 4.56 MIL/uL (ref 3.87–5.11)
RDW: 12.5 % (ref 11.5–15.5)
WBC: 10.4 10*3/uL (ref 4.0–10.5)

## 2018-10-23 LAB — I-STAT CREATININE, ED: Creatinine, Ser: 0.8 mg/dL (ref 0.44–1.00)

## 2018-10-23 LAB — DIFFERENTIAL
Abs Immature Granulocytes: 0.03 10*3/uL (ref 0.00–0.07)
Basophils Absolute: 0.1 10*3/uL (ref 0.0–0.1)
Basophils Relative: 1 %
Eosinophils Absolute: 0.2 10*3/uL (ref 0.0–0.5)
Eosinophils Relative: 2 %
Immature Granulocytes: 0 %
LYMPHS ABS: 2.5 10*3/uL (ref 0.7–4.0)
Lymphocytes Relative: 24 %
Monocytes Absolute: 0.5 10*3/uL (ref 0.1–1.0)
Monocytes Relative: 5 %
Neutro Abs: 7.1 10*3/uL (ref 1.7–7.7)
Neutrophils Relative %: 68 %

## 2018-10-23 LAB — APTT: aPTT: 30 seconds (ref 24–36)

## 2018-10-23 LAB — PROTIME-INR
INR: 1.1 (ref 0.8–1.2)
Prothrombin Time: 13.9 seconds (ref 11.4–15.2)

## 2018-10-23 MED ORDER — SODIUM CHLORIDE 0.9% FLUSH: 3.0000 mL | Freq: Once | INTRAVENOUS | Status: DC

## 2018-10-23 NOTE — ED Provider Notes (Signed)
[facility] EMERGENCY DEPARTMENT Provider Note   CSN: [id number] Arrival date & time: 10/23/18  1324    History   Chief Complaint Chief Complaint  Patient presents with  . Stroke Symptoms  . Atrial Fibrillation    HPI [name] is a 68 y.o. female.     Patient c/o onset dull headache earlier today, acute onset, moderate-sev, persistent, frontal, non radiating. Denies any recent head trauma. No eye pain or change in vision. No change in speech. No unilateral numbness or weakness. No change in balance or coordination. Denies facial pain or sinus pain. No jaw claudication. Denies neck pain or stiffness. No fever or chills. States vision seemed diffusely blurry earlier, but now improved. No amaurosis, no visual field cuts, no flashing or floaters. Notes recent ED/inpatient eval for TIA. On asa, no other anticoag use.   The history is provided by the patient.  Atrial Fibrillation  Associated symptoms include headaches. Pertinent negatives include no chest pain, no abdominal pain and no shortness of breath.    Past Medical History:  Diagnosis Date  . Depression   . Diabetes mellitus without complication (HCC)   . Hyperlipidemia 10/19/2018  . Hypertension   . Stroke [facility]) 10/19/2018    Patient Active Problem List   Diagnosis Date Noted  . Hyperlipidemia 10/19/2018  . TIA (transient ischemic attack) 10/18/2018  . Depression 10/18/2018  . Atypical chest pain 02/09/2013  . Diabetes type 2, controlled (HCC) 02/09/2013  . Hypertension 02/09/2013    Past Surgical History:  Procedure Laterality Date  . APPENDECTOMY    . BACK SURGERY    . CHOLECYSTECTOMY    . VAGINAL HYSTERECTOMY       OB History   No obstetric history on file.      Home Medications    Prior to Admission medications   Medication Sig Start Date End Date Taking? Authorizing Provider  ALPRAZolam Prudy Feeler) 0.5 MG tablet Take 0.5 mg by mouth daily as needed for anxiety.  03/20/18    [provider]  aspirin 325 MG tablet Take 1 tablet (325 mg total) by mouth daily. 10/20/18   Darlin Drop, DO  citalopram (CELEXA) 20 MG tablet Take 20 mg by mouth daily.    [provider]  metFORMIN (GLUCOPHAGE-XR) 500 MG 24 hr tablet Take by mouth. 09/22/18 09/22/19  [provider]  rosuvastatin (CRESTOR) 5 MG tablet  09/03/18   [provider]  SYNTHROID 100 MCG tablet Take 100 mcg by mouth daily before breakfast.  10/10/18   [provider]    Family History Family History  Problem Relation Age of Onset  . Colon cancer Father        died at 96  . Throat cancer Brother   . Breast cancer Sister        sister died at 75  . Coronary artery disease Mother        stent age 36, died at age 69 of complications from diabetes/heart disease  . Diabetes Mother   . Arrhythmia Brother     Social History Social History   Tobacco Use  . Smoking status: Never Smoker  Substance Use Topics  . Alcohol use: No  . Drug use: No     Allergies   Patient has no known allergies.   Review of Systems Review of Systems  Constitutional: Negative for fever.  HENT: Negative for sore throat.   Eyes: Negative for pain and redness.  Respiratory: Negative for shortness  of breath.   Cardiovascular: Negative for chest pain.  Gastrointestinal: Negative for abdominal pain and vomiting.  Genitourinary: Negative for flank pain.  Musculoskeletal: Negative for back pain, neck pain and neck stiffness.  Skin: Negative for rash.  Neurological: Positive for headaches. Negative for syncope, speech difficulty, weakness and numbness.  Hematological: Does not bruise/bleed easily.  Psychiatric/Behavioral: Negative for confusion.     Physical Exam Updated Vital Signs BP 98/80 (BP Location: Right Arm)   Pulse 69   Temp 98.4 F (36.9 C) (Oral)   Resp 14   Ht 1.651 m (5\' 5" )   Wt 81.2 kg   SpO2 96%   BMI 29.79 kg/m   Physical Exam Vitals signs and nursing  note reviewed.  Constitutional:      General: She is not in acute distress.    Appearance: Normal appearance. She is well-developed. She is not diaphoretic.  HENT:     Head: Atraumatic.     Comments: No sinus or temporal tenderness.     Nose: Nose normal.  Eyes:     General: No scleral icterus.    Conjunctiva/sclera: Conjunctivae normal.     Pupils: Pupils are equal, round, and reactive to light.  Neck:     Musculoskeletal: Normal range of motion and neck supple. No neck rigidity.     Thyroid: No thyromegaly.     Vascular: No carotid bruit.     Trachea: No tracheal deviation.     Comments: No stiffness or rigidity.  Cardiovascular:     Rate and Rhythm: Normal rate and regular rhythm.     Heart sounds: Normal heart sounds. No murmur. No friction rub. No gallop.   Pulmonary:     Effort: Pulmonary effort is normal. No respiratory distress.     Breath sounds: Normal breath sounds.  Abdominal:     General: Bowel sounds are normal. There is no distension.     Palpations: Abdomen is soft.     Tenderness: There is no abdominal tenderness.  Genitourinary:    Comments: No cva tenderness. Musculoskeletal: Normal range of motion.        General: No tenderness.     Right lower leg: No edema.     Left lower leg: No edema.  Skin:    General: Skin is warm and dry.     Findings: No rash.  Neurological:     Mental Status: She is alert and oriented to person, place, and time.     Cranial Nerves: No cranial nerve deficit.     Comments: Speech clear/fluent. Motor intact bil, stre 5/5. sens grossly intact bil. Steady gait.       ED Treatments / Results  Labs (all labs ordered are listed, but only abnormal results are displayed) Results for orders placed or performed during the hospital encounter of 10/23/18  Protime-INR  Result Value Ref Range   Prothrombin Time 13.9 11.4 - 15.2 seconds   INR 1.1 0.8 - 1.2  APTT  Result Value Ref Range   aPTT 30 24 - 36 seconds  CBC  Result Value  Ref Range   WBC 10.4 4.0 - 10.5 K/uL   RBC 4.56 3.87 - 5.11 MIL/uL   Hemoglobin 13.4 12.0 - 15.0 g/dL   HCT 28.3 66.2 - 94.7 %   MCV 94.3 80.0 - 100.0 fL   MCH 29.4 26.0 - 34.0 pg   MCHC 31.2 30.0 - 36.0 g/dL   RDW 65.4 65.0 - 35.4 %   Platelets 455 (H) 150 -  400 K/uL   nRBC 0.0 0.0 - 0.2 %  Differential  Result Value Ref Range   Neutrophils Relative % 68 %   Neutro Abs 7.1 1.7 - 7.7 K/uL   Lymphocytes Relative 24 %   Lymphs Abs 2.5 0.7 - 4.0 K/uL   Monocytes Relative 5 %   Monocytes Absolute 0.5 0.1 - 1.0 K/uL   Eosinophils Relative 2 %   Eosinophils Absolute 0.2 0.0 - 0.5 K/uL   Basophils Relative 1 %   Basophils Absolute 0.1 0.0 - 0.1 K/uL   Immature Granulocytes 0 %   Abs Immature Granulocytes 0.03 0.00 - 0.07 K/uL  Comprehensive metabolic panel  Result Value Ref Range   Sodium 140 135 - 145 mmol/L   Potassium 4.2 3.5 - 5.1 mmol/L   Chloride 108 98 - 111 mmol/L   CO2 25 22 - 32 mmol/L   Glucose, Bld 108 (H) 70 - 99 mg/dL   BUN 23 8 - 23 mg/dL   Creatinine, Ser 0.45 0.44 - 1.00 mg/dL   Calcium 9.3 8.9 - 40.9 mg/dL   Total Protein 7.8 6.5 - 8.1 g/dL   Albumin 3.7 3.5 - 5.0 g/dL   AST 16 15 - 41 U/L   ALT 15 0 - 44 U/L   Alkaline Phosphatase 43 38 - 126 U/L   Total Bilirubin 0.7 0.3 - 1.2 mg/dL   GFR calc non Af Amer >60 >60 mL/min   GFR calc Af Amer >60 >60 mL/min   Anion gap 7 5 - 15  I-stat Creatinine, ED  Result Value Ref Range   Creatinine, Ser 0.80 0.44 - 1.00 mg/dL   Dg Chest 2 View  Result Date: 10/19/2018 CLINICAL DATA:  Hypertension.  Weakness. EXAM: CHEST - 2 VIEW COMPARISON:  February 09, 2013 FINDINGS: There is stable scarring in the bases. There is no edema or consolidation. Heart size and pulmonary vascularity are normal. No adenopathy. There is degenerative change in the thoracic spine. IMPRESSION: Bibasilar scarring. No edema or consolidation. Stable cardiac silhouette. Electronically Signed   By: Bretta Bang III M.D.   On: 10/19/2018 08:49   Ct  Head Wo Contrast  Result Date: 10/23/2018 CLINICAL DATA:  TIA 4 days ago. Headache last night and today with blurred vision. EXAM: CT HEAD WITHOUT CONTRAST TECHNIQUE: Contiguous axial images were obtained from the base of the skull through the vertex without intravenous contrast. COMPARISON:  10/18/2018 and MRI 10/19/2018 FINDINGS: Brain: No evidence of acute infarction, hemorrhage, hydrocephalus, extra-axial collection or mass lesion/mass effect. Vascular: No hyperdense vessel or unexpected calcification. Skull: Normal. Negative for fracture or focal lesion. Sinuses/Orbits: Orbits are normal. Mild mucosal membrane thickening/mucous retention cyst over the posterior aspect of the left maxillary sinus. Minimal opacification over the inferior left mastoid air cells. Other: None. IMPRESSION: No acute findings. Minimal chronic inflammatory change over the left maxillary sinus and mild opacification of the inferior left mastoid air cells. Electronically Signed   By: Elberta Fortis M.D.   On: 10/23/2018 14:36   Mr Brain Wo Contrast  Result Date: 10/19/2018 CLINICAL DATA:  TIA.  Left-sided weakness EXAM: MRI HEAD WITHOUT CONTRAST MRA HEAD WITHOUT CONTRAST TECHNIQUE: Multiplanar, multiecho pulse sequences of the brain and surrounding structures were obtained without intravenous contrast. Angiographic images of the head were obtained using MRA technique without contrast. COMPARISON:  CT head 10/18/2018 FINDINGS: MRI HEAD FINDINGS Brain: No definite acute infarct. 5 mm diffusion hyperintensity in the left frontal convexity seen only on coronal diffusion. Possible artifact. No  significant chronic ischemia. Ventricle size normal. Negative for hemorrhage or mass. Vascular: Normal arterial flow voids Skull and upper cervical spine: Negative Sinuses/Orbits: Retention cyst left maxillary sinus. Mild mucosal thickening throughout the paranasal sinuses. Negative orbit Other: None MRA HEAD FINDINGS Both vertebral arteries widely  patent. Basilar widely patent. PICA, superior cerebellar, and posterior cerebral arteries widely patent. Internal carotid artery widely patent bilaterally. Anterior and middle cerebral arteries patent without stenosis or occlusion. Decreased signal inferior division right MCA due to tortuosity. Negative for cerebral aneurysm. IMPRESSION: 1. No definite acute infarct. Tiny diffusion abnormality in the left frontal convexity felt to be artifact. Correlate with symptoms. 2. Negative MRA head Electronically Signed   By: Marlan Palauharles  Clark M.D.   On: 10/19/2018 08:15   [place] Carotid Bilateral (at [facility] Only)  Result Date: 10/19/2018 CLINICAL DATA:  68 year old female with a history of left-sided weakness EXAM: BILATERAL CAROTID DUPLEX ULTRASOUND TECHNIQUE: Wallace CullensGray scale imaging, color Doppler and duplex ultrasound were performed of bilateral carotid and vertebral arteries in the neck. COMPARISON:  No prior duplex FINDINGS: Criteria: Quantification of carotid stenosis is based on velocity parameters that correlate the residual internal carotid diameter with NASCET-based stenosis levels, using the diameter of the distal internal carotid lumen as the denominator for stenosis measurement. The following velocity measurements were obtained: RIGHT ICA:  Systolic 76 cm/sec, Diastolic 27 cm/sec CCA:  66 cm/sec SYSTOLIC ICA/CCA RATIO:  1.1 ECA:  38 cm/sec LEFT ICA:  Systolic 57 cm/sec, Diastolic 20 cm/sec CCA:  77 cm/sec SYSTOLIC ICA/CCA RATIO:  0.8 ECA:  38 cm/sec Right Brachial SBP: Not acquired Left Brachial SBP: Not acquired RIGHT CAROTID ARTERY: No significant calcifications of the right common carotid artery. Intermediate waveform maintained. Heterogeneous and partially calcified plaque at the right carotid bifurcation. No significant lumen shadowing. Low resistance waveform of the right ICA. No significant tortuosity. RIGHT VERTEBRAL ARTERY: Antegrade flow with low resistance waveform. LEFT CAROTID ARTERY: No significant  calcifications of the left common carotid artery. Intermediate waveform maintained. Heterogeneous and partially calcified plaque at the left carotid bifurcation without significant lumen shadowing. Low resistance waveform of the left ICA. No significant tortuosity. LEFT VERTEBRAL ARTERY:  Antegrade flow with low resistance waveform. IMPRESSION: Color duplex indicates minimal heterogeneous and calcified plaque, with no hemodynamically significant stenosis by duplex criteria in the extracranial cerebrovascular circulation. Signed, Yvone NeuJaime S. Reyne DumasWagner, DO, RPVI [facility] Electronically Signed   By: Gilmer MorJaime  Wagner D.O.   On: 10/19/2018 08:52   Mr Maxine GlennMra Head/brain WUWo Cm  Result Date: 10/19/2018 CLINICAL DATA:  TIA.  Left-sided weakness EXAM: MRI HEAD WITHOUT CONTRAST MRA HEAD WITHOUT CONTRAST TECHNIQUE: Multiplanar, multiecho pulse sequences of the brain and surrounding structures were obtained without intravenous contrast. Angiographic images of the head were obtained using MRA technique without contrast. COMPARISON:  CT head 10/18/2018 FINDINGS: MRI HEAD FINDINGS Brain: No definite acute infarct. 5 mm diffusion hyperintensity in the left frontal convexity seen only on coronal diffusion. Possible artifact. No significant chronic ischemia. Ventricle size normal. Negative for hemorrhage or mass. Vascular: Normal arterial flow voids Skull and upper cervical spine: Negative Sinuses/Orbits: Retention cyst left maxillary sinus. Mild mucosal thickening throughout the paranasal sinuses. Negative orbit Other: None MRA HEAD FINDINGS Both vertebral arteries widely patent. Basilar widely patent. PICA, superior cerebellar, and posterior cerebral arteries widely patent. Internal carotid artery widely patent bilaterally. Anterior and middle cerebral arteries patent without stenosis or occlusion. Decreased signal inferior division right MCA due to tortuosity. Negative for  cerebral aneurysm. IMPRESSION: 1. No definite acute infarct. Tiny diffusion abnormality in the left frontal convexity felt to be artifact. Correlate with symptoms. 2. Negative MRA head Electronically Signed   By: Marlan Palau M.D.   On: 10/19/2018 08:15   Ct Head Code Stroke Wo Contrast`  Result Date: 10/18/2018 CLINICAL DATA:  Code stroke. Headache and LEFT-sided weakness, LEFT eye blindness. History of hypertension diabetes. EXAM: CT HEAD WITHOUT CONTRAST TECHNIQUE: Contiguous axial images were obtained from the base of the skull through the vertex without intravenous contrast. COMPARISON:  CT HEAD November 05, 2010 FINDINGS: BRAIN: No intraparenchymal hemorrhage, mass effect nor midline shift. No parenchymal brain volume loss for age. No hydrocephalus. Vague hypodensity RIGHT temporal lobe. No abnormal extra-axial fluid collections. Basal cisterns are patent. VASCULAR: Mild calcific atherosclerosis of the carotid siphons. SKULL: No skull fracture. No significant scalp soft tissue swelling. SINUSES/ORBITS: Trace paranasal sinus mucosal thickening. Mastoid air cells are well aerated.The included ocular globes and orbital contents are non-suspicious. OTHER: None. ASPECTS [facility] Stroke Program Early CT Score) - Ganglionic level infarction (caudate, lentiform nuclei, internal capsule, insula, M1-M3 cortex): 6 - Supraganglionic infarction (M4-M6 cortex): 3 Total score (0-10 with 10 being normal): 9 IMPRESSION: 1. Beam hardening artifact RIGHT temporal lobe, less likely infarct. Otherwise negative non-contrast CT HEAD for age. 2. ASPECTS is 9. 3. Critical Value/emergent results were called by telephone at the time of interpretation on 10/18/2018 at 10:08 pm to Dr. Donnetta Hutching , who verbally acknowledged these results. Electronically Signed   By: Awilda Metro M.D.   On: 10/18/2018 22:08    EKG EKG Interpretation  Date/Time:  Monday October 23 2018 13:37:04 EST Ventricular Rate:  68 PR Interval:    QRS  Duration: 86 QT Interval:  416 QTC Calculation: 443 R Axis:   -36 Text Interpretation:  Sinus rhythm Left axis deviation No significant change since last tracing Confirmed by Cathren Laine ([id number]) on 10/23/2018 1:43:19 PM   Radiology Ct Head Wo Contrast  Result Date: 10/23/2018 CLINICAL DATA:  TIA 4 days ago. Headache last night and today with blurred vision. EXAM: CT HEAD WITHOUT CONTRAST TECHNIQUE: Contiguous axial images were obtained from the base of the skull through the vertex without intravenous contrast. COMPARISON:  10/18/2018 and MRI 10/19/2018 FINDINGS: Brain: No evidence of acute infarction, hemorrhage, hydrocephalus, extra-axial collection or mass lesion/mass effect. Vascular: No hyperdense vessel or unexpected calcification. Skull: Normal. Negative for fracture or focal lesion. Sinuses/Orbits: Orbits are normal. Mild mucosal membrane thickening/mucous retention cyst over the posterior aspect of the left maxillary sinus. Minimal opacification over the inferior left mastoid air cells. Other: None. IMPRESSION: No acute findings. Minimal chronic inflammatory change over the left maxillary sinus and mild opacification of the inferior left mastoid air cells. Electronically Signed   By: Elberta Fortis M.D.   On: 10/23/2018 14:36    Procedures Procedures (including critical care time)  Medications Ordered in ED Medications  sodium chloride flush (NS) 0.9 % injection 3 mL (has no administration in time range)     Initial Impression / Assessment and Plan / ED Course  I have reviewed the triage vital signs and the nursing notes.  Pertinent labs & imaging results that were available during my care of the patient were reviewed by me and considered in my medical decision making (see chart for details).  Iv ns. Labs and imaging ordered.   Reviewed nursing notes and prior charts for additional history.   Labs reviewed - chem normal.   Ct reviewed -  no acute process.   Acetaminophen  po.  Headache much improved from prior.   No focal neuro complaints or findings on exam.   Vitals normal.  Pt currently appears stable for d/c.     Final Clinical Impressions(s) / ED Diagnoses   Final diagnoses:  None    ED Discharge Orders    None       Cathren Laine, MD 10/23/18 [phone]

## 2018-10-23 NOTE — ED Notes (Signed)
Per Dr Elly Modena no swallow screen needed

## 2018-10-23 NOTE — ED Notes (Signed)
RN sent 1 visitor back 

## 2018-10-23 NOTE — Discharge Instructions (Addendum)
It was our pleasure to provide your ER care today - we hope that you feel better.  Try acetaminophen, ibuprofen, or excedrin as need for headache.   For headaches, follow up with neurologist as outpatient - call office to arrange appointment. Also follow up closely with primary care doctor.  Return to ER if worse, new symptoms, high fevers, severe or intractable pain, persistent vomiting, change in speech or vision, other concern.

## 2018-10-23 NOTE — ED Triage Notes (Addendum)
Seen immediately on arrival at [facility] by EDP for possible stroke. Complains of headache, and blurred vision. No stroke activation per [facility]

## 2018-11-02 ENCOUNTER — Telehealth: Payer: Self-pay

## 2018-11-02 NOTE — Telephone Encounter (Signed)
NOTES ON FILE 

## 2019-05-25 ENCOUNTER — Other Ambulatory Visit: Payer: Self-pay | Admitting: Family Medicine

## 2019-05-25 DIAGNOSIS — Z1231 Encounter for screening mammogram for malignant neoplasm of breast: Secondary | ICD-10-CM

## 2019-05-28 ENCOUNTER — Other Ambulatory Visit: Payer: Self-pay

## 2019-05-28 ENCOUNTER — Ambulatory Visit
Admission: RE | Admit: 2019-05-28 | Discharge: 2019-05-28 | Disposition: A | Discharge status: Home / Self Care | Payer: Medicare HMO | Source: Ambulatory Visit | Attending: Family Medicine | Admitting: Family Medicine

## 2019-05-28 DIAGNOSIS — Z1231 Encounter for screening mammogram for malignant neoplasm of breast: Secondary | ICD-10-CM

## 2019-05-31 ENCOUNTER — Other Ambulatory Visit: Payer: Self-pay

## 2019-05-31 DIAGNOSIS — Z20822 Contact with and (suspected) exposure to covid-19: Secondary | ICD-10-CM

## 2019-06-01 LAB — NOVEL CORONAVIRUS, NAA: SARS-CoV-2, NAA: NOT DETECTED

## 2019-06-04 ENCOUNTER — Telehealth: Payer: Self-pay | Admitting: General Practice

## 2019-06-04 NOTE — Telephone Encounter (Signed)
Negative COVID results given. Patient results "NOT Detected." Caller expressed understanding. ° °

## 2019-06-27 ENCOUNTER — Other Ambulatory Visit: Payer: Self-pay

## 2019-06-27 DIAGNOSIS — Z20822 Contact with and (suspected) exposure to covid-19: Secondary | ICD-10-CM

## 2019-06-28 LAB — NOVEL CORONAVIRUS, NAA: SARS-CoV-2, NAA: NOT DETECTED

## 2019-07-02 ENCOUNTER — Telehealth: Payer: Self-pay | Admitting: Family Medicine

## 2019-07-02 NOTE — Telephone Encounter (Signed)
Negative COVID results given. Patient results "NOT Detected." Caller expressed understanding. ° °

## 2019-07-03 ENCOUNTER — Telehealth: Payer: Self-pay | Admitting: General Practice

## 2019-07-03 NOTE — Telephone Encounter (Signed)
Patient would like COVID results fax to [phone] Attention [employer].

## 2019-07-06 NOTE — Telephone Encounter (Signed)
Pt called back in, her job has not received results that were faxed, informed pt that results would be re-faxed.

## 2020-05-22 ENCOUNTER — Other Ambulatory Visit: Payer: Self-pay | Admitting: Family Medicine

## 2020-05-22 DIAGNOSIS — Z1231 Encounter for screening mammogram for malignant neoplasm of breast: Secondary | ICD-10-CM

## 2020-05-23 ENCOUNTER — Inpatient Hospital Stay (HOSPITAL_COMMUNITY)
Admission: EM | Admit: 2020-05-23 | Discharge: 2020-05-26 | DRG: 282 | Disposition: A | Discharge status: Home / Self Care | Payer: Medicare HMO | Attending: Internal Medicine | Admitting: Internal Medicine

## 2020-05-23 ENCOUNTER — Encounter (HOSPITAL_COMMUNITY): Payer: Self-pay | Admitting: Emergency Medicine

## 2020-05-23 ENCOUNTER — Emergency Department (HOSPITAL_COMMUNITY): Payer: Medicare HMO

## 2020-05-23 ENCOUNTER — Other Ambulatory Visit: Payer: Self-pay

## 2020-05-23 DIAGNOSIS — Z6833 Body mass index (BMI) 33.0-33.9, adult: Secondary | ICD-10-CM

## 2020-05-23 DIAGNOSIS — F32A Depression, unspecified: Secondary | ICD-10-CM | POA: Diagnosis present

## 2020-05-23 DIAGNOSIS — R001 Bradycardia, unspecified: Secondary | ICD-10-CM | POA: Diagnosis not present

## 2020-05-23 DIAGNOSIS — Z8249 Family history of ischemic heart disease and other diseases of the circulatory system: Secondary | ICD-10-CM

## 2020-05-23 DIAGNOSIS — Z7982 Long term (current) use of aspirin: Secondary | ICD-10-CM

## 2020-05-23 DIAGNOSIS — Z8673 Personal history of transient ischemic attack (TIA), and cerebral infarction without residual deficits: Secondary | ICD-10-CM

## 2020-05-23 DIAGNOSIS — Z90710 Acquired absence of both cervix and uterus: Secondary | ICD-10-CM

## 2020-05-23 DIAGNOSIS — Z9049 Acquired absence of other specified parts of digestive tract: Secondary | ICD-10-CM

## 2020-05-23 DIAGNOSIS — I480 Paroxysmal atrial fibrillation: Secondary | ICD-10-CM | POA: Diagnosis not present

## 2020-05-23 DIAGNOSIS — Z79890 Hormone replacement therapy (postmenopausal): Secondary | ICD-10-CM

## 2020-05-23 DIAGNOSIS — Z79899 Other long term (current) drug therapy: Secondary | ICD-10-CM

## 2020-05-23 DIAGNOSIS — I10 Essential (primary) hypertension: Secondary | ICD-10-CM | POA: Diagnosis present

## 2020-05-23 DIAGNOSIS — Z833 Family history of diabetes mellitus: Secondary | ICD-10-CM

## 2020-05-23 DIAGNOSIS — I2510 Atherosclerotic heart disease of native coronary artery without angina pectoris: Secondary | ICD-10-CM | POA: Diagnosis present

## 2020-05-23 DIAGNOSIS — E119 Type 2 diabetes mellitus without complications: Secondary | ICD-10-CM | POA: Diagnosis present

## 2020-05-23 DIAGNOSIS — R0789 Other chest pain: Secondary | ICD-10-CM | POA: Diagnosis present

## 2020-05-23 DIAGNOSIS — E039 Hypothyroidism, unspecified: Secondary | ICD-10-CM | POA: Diagnosis present

## 2020-05-23 DIAGNOSIS — Z20822 Contact with and (suspected) exposure to covid-19: Secondary | ICD-10-CM | POA: Diagnosis present

## 2020-05-23 DIAGNOSIS — Z91048 Other nonmedicinal substance allergy status: Secondary | ICD-10-CM

## 2020-05-23 DIAGNOSIS — R079 Chest pain, unspecified: Secondary | ICD-10-CM

## 2020-05-23 DIAGNOSIS — E785 Hyperlipidemia, unspecified: Secondary | ICD-10-CM | POA: Diagnosis present

## 2020-05-23 DIAGNOSIS — R778 Other specified abnormalities of plasma proteins: Secondary | ICD-10-CM

## 2020-05-23 DIAGNOSIS — E669 Obesity, unspecified: Secondary | ICD-10-CM | POA: Diagnosis present

## 2020-05-23 DIAGNOSIS — I214 Non-ST elevation (NSTEMI) myocardial infarction: Secondary | ICD-10-CM | POA: Diagnosis not present

## 2020-05-23 HISTORY — DX: Hypothyroidism, unspecified: E03.9

## 2020-05-23 LAB — CBC WITH DIFFERENTIAL/PLATELET
Abs Immature Granulocytes: 0.04 10*3/uL (ref 0.00–0.07)
Basophils Absolute: 0 10*3/uL (ref 0.0–0.1)
Basophils Relative: 0 %
Eosinophils Absolute: 0.3 10*3/uL (ref 0.0–0.5)
Eosinophils Relative: 3 %
HCT: 38.8 % (ref 36.0–46.0)
Hemoglobin: 12.7 g/dL (ref 12.0–15.0)
Immature Granulocytes: 0 %
Lymphocytes Relative: 20 %
Lymphs Abs: 2 10*3/uL (ref 0.7–4.0)
MCH: 31.1 pg (ref 26.0–34.0)
MCHC: 32.7 g/dL (ref 30.0–36.0)
MCV: 95.1 fL (ref 80.0–100.0)
Monocytes Absolute: 0.7 10*3/uL (ref 0.1–1.0)
Monocytes Relative: 7 %
Neutro Abs: 7 10*3/uL (ref 1.7–7.7)
Neutrophils Relative %: 70 %
Platelets: 354 10*3/uL (ref 150–400)
RBC: 4.08 MIL/uL (ref 3.87–5.11)
RDW: 13 % (ref 11.5–15.5)
WBC: 10 10*3/uL (ref 4.0–10.5)
nRBC: 0 % (ref 0.0–0.2)

## 2020-05-23 LAB — BASIC METABOLIC PANEL
Anion gap: 8 (ref 5–15)
BUN: 24 mg/dL — ABNORMAL HIGH (ref 8–23)
CO2: 26 mmol/L (ref 22–32)
Calcium: 8.6 mg/dL — ABNORMAL LOW (ref 8.9–10.3)
Chloride: 104 mmol/L (ref 98–111)
Creatinine, Ser: 1.04 mg/dL — ABNORMAL HIGH (ref 0.44–1.00)
GFR calc Af Amer: >60 mL/min (ref >60)
GFR calc non Af Amer: 56 mL/min — ABNORMAL LOW (ref >60)
Glucose, Bld: 161 mg/dL — ABNORMAL HIGH (ref 70–99)
Potassium: 4 mmol/L (ref 3.5–5.1)
Sodium: 138 mmol/L (ref 135–145)

## 2020-05-23 LAB — TROPONIN I (HIGH SENSITIVITY)
Troponin I (High Sensitivity): 36 ng/L — ABNORMAL HIGH (ref <18)
Troponin I (High Sensitivity): 119 ng/L (ref <18)

## 2020-05-23 NOTE — ED Notes (Signed)
Date and time results received: 05/23/20 2312  Test: Troponin Critical Value: 119  Name of Provider Notified: Zavitz  Orders Received? Or Actions Taken?: na

## 2020-05-23 NOTE — Progress Notes (Signed)
Spoke with EDP re: admission. As we do not have cards here at [facility] on weekends, will need delta trop and also CXR before admission can be accepted. Awaiting those results.

## 2020-05-23 NOTE — ED Triage Notes (Signed)
Pt arrives via [facility] w/complaints of chest pain since 1800. Denies nausea & dizziness, states slightly SOB. Took 324mg  of aspirin @ home. States pain radiated to left arm & under L breat. EMS gave nitro en route. Vitals WDL & pt NAD during triage

## 2020-05-23 NOTE — ED Provider Notes (Signed)
[facility] EMERGENCY DEPARTMENT Provider Note   CSN: [id number] Arrival date & time: 05/23/20  1935     History Chief Complaint  Patient presents with  . Chest Pain    [name] is a 69 y.o. female.  69 year old woman with past medical history of diabetes presents today with chest pain that started about 1800.  She reports she was walking to her kitchen when she felt chest pain that was pressure-like and radiated to her left arm.  She denies diaphoresis, nausea, vomiting, and heartburn type pain.  She has never had this kind of chest pain before.  She took an aspirin 325 mg, and called EMS who gave her nitro x1.  Her pain is improved since that point but is starting to occur again.  Pain is reproducible on palpation of the left chest.  She reports she has significant family history of MIs, including in her mother and aunt who had MIs in their 27s.     HPI: A 69 year old patient with a history of treated diabetes, hypertension, hypercholesterolemia and obesity presents for evaluation of chest pain. Initial onset of pain was approximately 1-3 hours ago. The patient's chest pain is described as heaviness/pressure/tightness, is worse with exertion and is relieved by nitroglycerin. The patient's chest pain is middle- or left-sided, is not well-localized, is not sharp and does radiate to the arms/jaw/neck. The patient does not complain of nausea and denies diaphoresis. The patient has no history of stroke, has no history of peripheral artery disease, has not smoked in the past 90 days and has no relevant family history of coronary artery disease (first degree relative at less than age 35).   Past Medical History:  Diagnosis Date  . Depression   . Diabetes mellitus without complication (HCC)   . Hyperlipidemia 10/19/2018  . Hypertension   . Stroke [facility]) 10/19/2018    Patient Active Problem List   Diagnosis Date Noted  . NSTEMI (non-ST elevated myocardial infarction) (HCC) 05/24/2020    . Hyperlipidemia 10/19/2018  . TIA (transient ischemic attack) 10/18/2018  . Depression 10/18/2018  . Atypical chest pain 02/09/2013  . Diabetes type 2, controlled (HCC) 02/09/2013  . Hypertension 02/09/2013    Past Surgical History:  Procedure Laterality Date  . APPENDECTOMY    . BACK SURGERY    . CHOLECYSTECTOMY    . VAGINAL HYSTERECTOMY       OB History   No obstetric history on file.     Family History  Problem Relation Age of Onset  . Colon cancer Father        died at 9  . Throat cancer Brother   . Breast cancer Sister        sister died at 38  . Coronary artery disease Mother        stent age 79, died at age 70 of complications from diabetes/heart disease  . Diabetes Mother   . Arrhythmia Brother     Social History   Tobacco Use  . Smoking status: Never Smoker  . Smokeless tobacco: Never Used  Substance Use Topics  . Alcohol use: No  . Drug use: No    Home Medications Prior to Admission medications   Medication Sig Start Date End Date Taking? Authorizing Provider  ALPRAZolam Prudy Feeler) 0.5 MG tablet Take 0.5 mg by mouth daily as needed for anxiety.  03/20/18  Yes [provider]  aspirin 325 MG tablet Take 1 tablet (325 mg total) by mouth daily. 10/20/18  Yes Margo Aye,  Oliver Pila, DO  citalopram (CELEXA) 20 MG tablet Take 20 mg by mouth daily.   Yes [provider]  rosuvastatin (CRESTOR) 5 MG tablet Take 5 mg by mouth at bedtime.  09/03/18  Yes [provider]  SYNTHROID 100 MCG tablet Take 100 mcg by mouth daily before breakfast.  10/10/18  Yes [provider]  aspirin EC 81 MG tablet Take 81 mg by mouth daily. Patient not taking: Reported on 05/23/2020    [provider]    Allergies    Tape  Review of Systems   Review of Systems  Constitutional: Negative for activity change, chills and fever.  HENT: Negative for congestion, rhinorrhea and sore throat.   Respiratory: Negative for cough, choking, chest tightness  and shortness of breath.   Cardiovascular: Positive for chest pain. Negative for palpitations.  Gastrointestinal: Negative for abdominal pain, diarrhea, nausea and vomiting.  Musculoskeletal: Positive for myalgias.  Skin: Negative for rash.  Neurological: Negative for dizziness, syncope and weakness.  All other systems reviewed and are negative.   Physical Exam Updated Vital Signs BP 120/79   Pulse 72   Temp 98.5 F (36.9 C) (Oral)   Resp 14   Ht 5\' 5"  (1.651 m)   Wt 90.7 kg   SpO2 94%   BMI 33.28 kg/m   Physical Exam Vitals and nursing note reviewed.  Constitutional:      General: She is not in acute distress.    Appearance: She is well-developed. She is obese. She is not ill-appearing, toxic-appearing or diaphoretic.  HENT:     Head: Normocephalic and atraumatic.  Cardiovascular:     Rate and Rhythm: Normal rate and regular rhythm.  No extrasystoles are present.    Chest Wall: PMI is not displaced.     Pulses:          Carotid pulses are 2+ on the right side and 2+ on the left side.      Radial pulses are 2+ on the right side and 2+ on the left side.       Dorsalis pedis pulses are 2+ on the right side and 2+ on the left side.       Posterior tibial pulses are 2+ on the right side and 2+ on the left side.     Heart sounds: Normal heart sounds. Heart sounds not distant. No murmur heard.   Pulmonary:     Effort: Pulmonary effort is normal.  Chest:     Chest wall: Tenderness present. No crepitus or edema.     Comments: Tender to palpation across left 5th intercostal Abdominal:     General: Bowel sounds are normal.     Palpations: Abdomen is soft.  Musculoskeletal:     Right lower leg: No edema.     Left lower leg: No edema.  Skin:    General: Skin is warm and dry.  Neurological:     General: No focal deficit present.     Mental Status: She is alert and oriented to person, place, and time.  Psychiatric:        Mood and Affect: Mood normal.        Behavior:  Behavior normal.     ED Results / Procedures / Treatments   Labs (all labs ordered are listed, but only abnormal results are displayed) Labs Reviewed  BASIC METABOLIC PANEL - Abnormal; Notable for the following components:      Result Value   Glucose, Bld 161 (*)  BUN 24 (*)    Creatinine, Ser 1.04 (*)    Calcium 8.6 (*)    GFR calc non Af Amer 56 (*)    All other components within normal limits  TROPONIN I (HIGH SENSITIVITY) - Abnormal; Notable for the following components:   Troponin I (High Sensitivity) 36 (*)    All other components within normal limits  TROPONIN I (HIGH SENSITIVITY) - Abnormal; Notable for the following components:   Troponin I (High Sensitivity) 119 (*)    All other components within normal limits  CBC WITH DIFFERENTIAL/PLATELET    EKG EKG Interpretation  Date/Time:  Friday May 23 2020 19:45:13 EDT Ventricular Rate:  81 PR Interval:    QRS Duration: 91 QT Interval:  392 QTC Calculation: 455 R Axis:   -29 Text Interpretation: Sinus rhythm Borderline left axis deviation Low voltage, precordial leads Baseline wander in lead(s) V3 Confirmed by Blane Ohara [id number]) on 05/23/2020 8:46:45 PM   Radiology DG Chest Portable 1 View  Result Date: 05/23/2020 CLINICAL DATA:  Chest pain EXAM: PORTABLE CHEST 1 VIEW COMPARISON:  October 19, 2018 FINDINGS: The heart size and mediastinal contours are within normal limits. There is mild prominence of the central pulmonary vasculature. No large airspace consolidation or pleural effusion. No acute osseous abnormality. IMPRESSION: Mild pulmonary vascular congestion. Electronically Signed   By: Jonna Clark M.D.   On: 05/23/2020 23:42    Procedures Procedures (including critical care time)  Medications Ordered in ED Medications - No data to display  ED Course  I have reviewed the triage vital signs and the nursing notes.  Pertinent labs & imaging results that were available during my care of the patient  were reviewed by me and considered in my medical decision making (see chart for details).  Clinical Course as of May 24 9  Fri May 23, 2020  2246 Heart score elevated at 7, initial trop mildly elevated at 36. Will consult hospitalist for observation.   [CM]  2301 Consulted hospitalist. Will obtain CXR, follow up delta troponin and will call back when results available. If subsequent delta troponin elevated and patient needs to be seen by cardiologist, will require transfer.   [CM]  2342 CXR returned, no signs of CHF. Troponin elevated from 36 to 119. Will reconsult hospitalist.   [CM]    Clinical Course User Index [CM] Shirlean Mylar, MD   MDM Rules/Calculators/A&P HEAR Score: 7                        Patient requires admission for ACS rule out, increasing troponin. Consulted hospitalist, cardiology. Patient agreeable for observation overnight.  Final Clinical Impression(s) / ED Diagnoses Final diagnoses:  Chest pain, unspecified type  Troponin level elevated    Rx / DC Orders ED Discharge Orders    None       Shirlean Mylar, MD 05/24/20 4081    Blane Ohara, MD 05/24/20 [phone]

## 2020-05-24 ENCOUNTER — Encounter (HOSPITAL_COMMUNITY): Payer: Self-pay | Admitting: Family Medicine

## 2020-05-24 ENCOUNTER — Inpatient Hospital Stay (HOSPITAL_COMMUNITY): Payer: Medicare HMO

## 2020-05-24 DIAGNOSIS — R079 Chest pain, unspecified: Secondary | ICD-10-CM | POA: Diagnosis not present

## 2020-05-24 DIAGNOSIS — Z79899 Other long term (current) drug therapy: Secondary | ICD-10-CM | POA: Diagnosis not present

## 2020-05-24 DIAGNOSIS — Z90710 Acquired absence of both cervix and uterus: Secondary | ICD-10-CM | POA: Diagnosis not present

## 2020-05-24 DIAGNOSIS — R001 Bradycardia, unspecified: Secondary | ICD-10-CM | POA: Diagnosis not present

## 2020-05-24 DIAGNOSIS — I2510 Atherosclerotic heart disease of native coronary artery without angina pectoris: Secondary | ICD-10-CM | POA: Diagnosis present

## 2020-05-24 DIAGNOSIS — I214 Non-ST elevation (NSTEMI) myocardial infarction: Secondary | ICD-10-CM | POA: Diagnosis present

## 2020-05-24 DIAGNOSIS — E119 Type 2 diabetes mellitus without complications: Secondary | ICD-10-CM | POA: Diagnosis not present

## 2020-05-24 DIAGNOSIS — Z833 Family history of diabetes mellitus: Secondary | ICD-10-CM | POA: Diagnosis not present

## 2020-05-24 DIAGNOSIS — E785 Hyperlipidemia, unspecified: Secondary | ICD-10-CM | POA: Diagnosis not present

## 2020-05-24 DIAGNOSIS — I10 Essential (primary) hypertension: Secondary | ICD-10-CM | POA: Diagnosis not present

## 2020-05-24 DIAGNOSIS — Z91048 Other nonmedicinal substance allergy status: Secondary | ICD-10-CM | POA: Diagnosis not present

## 2020-05-24 DIAGNOSIS — Z8673 Personal history of transient ischemic attack (TIA), and cerebral infarction without residual deficits: Secondary | ICD-10-CM | POA: Diagnosis not present

## 2020-05-24 DIAGNOSIS — F32A Depression, unspecified: Secondary | ICD-10-CM | POA: Diagnosis not present

## 2020-05-24 DIAGNOSIS — I480 Paroxysmal atrial fibrillation: Secondary | ICD-10-CM | POA: Diagnosis not present

## 2020-05-24 DIAGNOSIS — Z8249 Family history of ischemic heart disease and other diseases of the circulatory system: Secondary | ICD-10-CM | POA: Diagnosis not present

## 2020-05-24 DIAGNOSIS — Z7982 Long term (current) use of aspirin: Secondary | ICD-10-CM | POA: Diagnosis not present

## 2020-05-24 DIAGNOSIS — Z79890 Hormone replacement therapy (postmenopausal): Secondary | ICD-10-CM | POA: Diagnosis not present

## 2020-05-24 DIAGNOSIS — Z6833 Body mass index (BMI) 33.0-33.9, adult: Secondary | ICD-10-CM | POA: Diagnosis not present

## 2020-05-24 DIAGNOSIS — E039 Hypothyroidism, unspecified: Secondary | ICD-10-CM | POA: Diagnosis not present

## 2020-05-24 DIAGNOSIS — Z20822 Contact with and (suspected) exposure to covid-19: Secondary | ICD-10-CM | POA: Diagnosis not present

## 2020-05-24 DIAGNOSIS — E669 Obesity, unspecified: Secondary | ICD-10-CM | POA: Diagnosis not present

## 2020-05-24 DIAGNOSIS — E1122 Type 2 diabetes mellitus with diabetic chronic kidney disease: Secondary | ICD-10-CM | POA: Diagnosis not present

## 2020-05-24 DIAGNOSIS — Z9049 Acquired absence of other specified parts of digestive tract: Secondary | ICD-10-CM | POA: Diagnosis not present

## 2020-05-24 DIAGNOSIS — E782 Mixed hyperlipidemia: Secondary | ICD-10-CM | POA: Diagnosis not present

## 2020-05-24 HISTORY — PX: TRANSTHORACIC ECHOCARDIOGRAM: SHX275

## 2020-05-24 LAB — RESPIRATORY PANEL BY RT PCR (FLU A&B, COVID)
Influenza A by PCR: NEGATIVE
Influenza B by PCR: NEGATIVE
SARS Coronavirus 2 by RT PCR: NEGATIVE

## 2020-05-24 LAB — ECHOCARDIOGRAM COMPLETE
Area-P 1/2: 3.06 cm2
Height: 65 in
S' Lateral: 2.79 cm
Weight: 3200 oz

## 2020-05-24 LAB — LIPID PANEL
Cholesterol: 115 mg/dL (ref 0–200)
HDL: 39 mg/dL — ABNORMAL LOW (ref >40)
LDL Cholesterol: 56 mg/dL (ref 0–99)
Total CHOL/HDL Ratio: 2.9 RATIO
Triglycerides: 102 mg/dL (ref <150)
VLDL: 20 mg/dL (ref 0–40)

## 2020-05-24 LAB — HIV ANTIBODY (ROUTINE TESTING W REFLEX): HIV Screen 4th Generation wRfx: NONREACTIVE

## 2020-05-24 LAB — GLUCOSE, CAPILLARY
Glucose-Capillary: 112 mg/dL — ABNORMAL HIGH (ref 70–99)
Glucose-Capillary: 119 mg/dL — ABNORMAL HIGH (ref 70–99)

## 2020-05-24 LAB — TROPONIN I (HIGH SENSITIVITY)
Troponin I (High Sensitivity): 3786 ng/L (ref <18)
Troponin I (High Sensitivity): 6628 ng/L (ref <18)

## 2020-05-24 LAB — HEPARIN LEVEL (UNFRACTIONATED): Heparin Unfractionated: 0.29 IU/mL — ABNORMAL LOW (ref 0.30–0.70)

## 2020-05-24 MED ORDER — ONDANSETRON HCL 4 MG/2ML IJ SOLN: 4.0000 mg | Freq: Four times a day (QID) | INTRAMUSCULAR | Status: DC | PRN

## 2020-05-24 MED ORDER — ACETAMINOPHEN 325 MG PO TABS
650.0000 mg | ORAL_TABLET | ORAL | Status: DC | PRN
  Administered 2020-05-24: 650 mg via ORAL
  Filled 2020-05-24: qty 2

## 2020-05-24 MED ORDER — LEVOTHYROXINE SODIUM 100 MCG PO TABS
100.0000 ug | ORAL_TABLET | Freq: Every day | ORAL | Status: DC
  Administered 2020-05-24 – 2020-05-26 (×3): 100 ug via ORAL
  Filled 2020-05-24: qty 1
  Filled 2020-05-24: qty 2
  Filled 2020-05-24: qty 1

## 2020-05-24 MED ORDER — ROSUVASTATIN CALCIUM 5 MG PO TABS
5.0000 mg | ORAL_TABLET | Freq: Every day | ORAL | Status: DC
  Filled 2020-05-24 (×2): qty 1

## 2020-05-24 MED ORDER — MORPHINE SULFATE (PF) 2 MG/ML IV SOLN
2.0000 mg | Freq: Once | INTRAVENOUS | Status: AC
  Administered 2020-05-24: 2 mg via INTRAVENOUS
  Filled 2020-05-24: qty 1

## 2020-05-24 MED ORDER — ASPIRIN EC 81 MG PO TBEC
81.0000 mg | DELAYED_RELEASE_TABLET | Freq: Every day | ORAL | Status: DC
  Administered 2020-05-24 – 2020-05-25 (×2): 81 mg via ORAL
  Filled 2020-05-24 (×2): qty 1

## 2020-05-24 MED ORDER — ROSUVASTATIN CALCIUM 20 MG PO TABS
40.0000 mg | ORAL_TABLET | Freq: Every day | ORAL | Status: DC
  Administered 2020-05-24 – 2020-05-25 (×2): 40 mg via ORAL
  Filled 2020-05-24 (×2): qty 2

## 2020-05-24 MED ORDER — DILTIAZEM HCL-DEXTROSE 125-5 MG/125ML-% IV SOLN (PREMIX)
5.0000 mg/h | INTRAVENOUS | Status: DC
  Administered 2020-05-24: 5 mg/h via INTRAVENOUS
  Filled 2020-05-24: qty 125

## 2020-05-24 MED ORDER — CITALOPRAM HYDROBROMIDE 20 MG PO TABS
20.0000 mg | ORAL_TABLET | Freq: Every day | ORAL | Status: DC
  Administered 2020-05-24 – 2020-05-26 (×3): 20 mg via ORAL
  Filled 2020-05-24: qty 1
  Filled 2020-05-24: qty 2
  Filled 2020-05-24 (×3): qty 1

## 2020-05-24 MED ORDER — HEPARIN BOLUS VIA INFUSION
4000.0000 [IU] | Freq: Once | INTRAVENOUS | Status: AC
  Administered 2020-05-24: 4000 [IU] via INTRAVENOUS

## 2020-05-24 MED ORDER — HEPARIN (PORCINE) 25000 UT/250ML-% IV SOLN
1500.0000 [IU]/h | INTRAVENOUS | Status: DC
  Administered 2020-05-24: 1100 [IU]/h via INTRAVENOUS
  Administered 2020-05-24: 1000 [IU]/h via INTRAVENOUS
  Administered 2020-05-25: 1500 [IU]/h via INTRAVENOUS
  Filled 2020-05-24 (×3): qty 250

## 2020-05-24 MED ORDER — ALPRAZOLAM 0.5 MG PO TABS
0.5000 mg | ORAL_TABLET | Freq: Every day | ORAL | Status: DC | PRN
  Administered 2020-05-24 – 2020-05-25 (×2): 0.5 mg via ORAL
  Filled 2020-05-24 (×2): qty 1

## 2020-05-24 MED ORDER — DILTIAZEM LOAD VIA INFUSION
10.0000 mg | Freq: Once | INTRAVENOUS | Status: AC
  Administered 2020-05-24: 10 mg via INTRAVENOUS
  Filled 2020-05-24: qty 10

## 2020-05-24 MED ORDER — CARVEDILOL 3.125 MG PO TABS
3.1250 mg | ORAL_TABLET | Freq: Two times a day (BID) | ORAL | Status: DC
  Administered 2020-05-24 (×2): 3.125 mg via ORAL
  Filled 2020-05-24 (×2): qty 1

## 2020-05-24 NOTE — Progress Notes (Signed)
ANTICOAGULATION CONSULT NOTE - Initial Consult  Pharmacy Consult for heparin Indication: chest pain/ACS  Allergies  Allergen Reactions  . Tape Rash    Can only tolerate LIMITED exposure; no "plastic tape," please    Patient Measurements: Height: 5\' 5"  (165.1 cm) Weight: 90.7 kg (200 lb) IBW/kg (Calculated) : 57 Heparin Dosing Weight: 75kg  Vital Signs: BP: 124/65 (10/02 0800) Pulse Rate: 67 (10/02 0800)  Labs: Recent Labs    05/23/20 2051 05/23/20 2051 05/23/20 2221 05/24/20 0330 05/24/20 0835  HGB 12.7  --   --   --   --   HCT 38.8  --   --   --   --   PLT 354  --   --   --   --   HEPARINUNFRC  --   --   --   --  0.29*  CREATININE 1.04*  --   --   --   --   TROPONINIHS 36*   < > 119* 3,786* 6,628*   < > = values in this interval not displayed.    Estimated Creatinine Clearance: 58.4 mL/min (A) (by C-G formula based on SCr of 1.04 mg/dL (H)).   Medical History: Past Medical History:  Diagnosis Date  . Depression   . Diabetes mellitus without complication (HCC)   . Hyperlipidemia 10/19/2018  . Hypertension   . Stroke (HCC) 10/19/2018    Assessment: 68yo female c/o CP radiating to LUE and associated w/ mild SOB, troponin mildly elevated and increasing, to begin heparin.  HL 0.29,  Subtherapeutic   Goal of Therapy:  Heparin level 0.3-0.7 units/ml Monitor platelets by anticoagulation protocol: Yes   Plan:  Increase heparin IV to 1100 units/hr  Recheck heparin level in 6 hours Continue to monitor for signs and symptoms of bleeding   05/24/2020,10:18 AM

## 2020-05-24 NOTE — ED Notes (Signed)
Date and time results received: 05/24/20 0508  Test: Troponin Critical Value: 3,786  Name of Provider Notified: Dr. Herma Carson  Orders Received? Or Actions Taken?: na

## 2020-05-24 NOTE — H&P (Signed)
TRH H&P    Patient Demographics:    [name], is a 69 y.o. female  MRN: [id number]  DOB - [birth year]  Admit Date - 05/23/2020  Referring MD/NP/PA: Leary RocaMahoney  Outpatient Primary MD for the patient is Gwenlyn FoundEksir, Samantha A, MD  Patient coming from: Home via EMS  Chief complaint-chest pain   HPI:    [name]  is a 69 y.o. female, with history of hypothyroidism, hyperlipidemia, hypertension, diet-controlled diabetes mellitus, and more presents to the ED with a chief complaint of chest pain.  Patient reports that she has been having the spells for quite some time but this 1 was worse.  She reports that she had left-sided chest pain that started on the evening of 05/23/2020 between 4 and 6 PM.  Pain started under her left breast and radiated down her left arm to the elbow.  She reports that she usually thinks it is just gas when this happened so she drinks some soda, belches, and then feels better.  That did not work this time.  She took aspirin at home.  She reports that nitro given to her in the EMS did improve her pain.  She denied palpitations, diaphoresis, but admitted to shortness of breath and lightheadedness.  She did not feel presyncopal just "woozy."  Patient reports that she has never had an MI, never had a cardiac cath.  She has had a stress test 20 years ago per her report.  Her family history for cardiac disease includes MIs in her mother, uncles, and aunts.  None of them had a cardiac event younger than the age 68.  Patient does report Covid exposure in June of this year, but she has not tested positive for Covid.  She is vaccinated for Covid.  Patient has no other complaints at this time.  In the ED Temperature 98.5, heart rate 70, respiratory 16, blood pressure 124/72 White blood cell count 10, hemoglobin 12.7 CHEM panel shows a BUN to creatinine of 24-1.04, and a glucose of 161 Troponin uptrending from  36 to 118 Chest x-ray shows mild pulmonary vascular congestion EKG showed heart rate 81, sinus rhythm, QTC of 4 and 55, no acute ischemic changes      Review of systems:    In addition to the HPI above,  No Fever-chills, No Headache, No changes with Vision or hearing, No problems swallowing food or Liquids, Positive for chest pain and shortness of breath No Abdominal pain, No Nausea or Vomiting, bowel movements are regular, No Blood in stool or Urine, No dysuria, No new skin rashes or bruises, No new joints pains-aches,  No new weakness, tingling, numbness in any extremity, No recent weight gain or loss, No polyuria, polydypsia or polyphagia, No significant Mental Stressors.  All other systems reviewed and are negative.    Past History of the following :    Past Medical History:  Diagnosis Date  . Depression   . Diabetes mellitus without complication (HCC)   . Hyperlipidemia 10/19/2018  . Hypertension   . Stroke [facility](HCC) 10/19/2018  Past Surgical History:  Procedure Laterality Date  . APPENDECTOMY    . BACK SURGERY    . CHOLECYSTECTOMY    . VAGINAL HYSTERECTOMY        Social History:      Social History   Tobacco Use  . Smoking status: Never Smoker  . Smokeless tobacco: Never Used  Substance Use Topics  . Alcohol use: No       Family History :     Family History  Problem Relation Age of Onset  . Colon cancer Father        died at 34  . Throat cancer Brother   . Breast cancer Sister        sister died at 37  . Coronary artery disease Mother        stent age 55, died at age 59 of complications from diabetes/heart disease  . Diabetes Mother   . Arrhythmia Brother       Home Medications:   Prior to Admission medications   Medication Sig Start Date End Date Taking? Authorizing Provider  ALPRAZolam Prudy Feeler) 0.5 MG tablet Take 0.5 mg by mouth daily as needed for anxiety.  03/20/18  Yes [provider]  aspirin 325 MG tablet Take 1  tablet (325 mg total) by mouth daily. 10/20/18  Yes Darlin Drop, DO  citalopram (CELEXA) 20 MG tablet Take 20 mg by mouth daily.   Yes [provider]  rosuvastatin (CRESTOR) 5 MG tablet Take 5 mg by mouth at bedtime.  09/03/18  Yes [provider]  SYNTHROID 100 MCG tablet Take 100 mcg by mouth daily before breakfast.  10/10/18  Yes [provider]  aspirin EC 81 MG tablet Take 81 mg by mouth daily. Patient not taking: Reported on 05/23/2020    [provider]     Allergies:     Allergies  Allergen Reactions  . Tape Rash    Can only tolerate LIMITED exposure; no "plastic tape," please     Physical Exam:   Vitals  Blood pressure (!) 122/98, pulse 69, temperature 98.5 F (36.9 C), temperature source Oral, resp. rate 19, height 5\' 5"  (1.651 m), weight 90.7 kg, SpO2 91 %.  1.  General: Lying supine in bed in no acute distress  2. Psychiatric: Mood and behavior normal for situation seems to have reasonable insight into healthcare  3. Neurologic: Cranial nerves II through XII are grossly intact, moves all 4 extremities voluntarily  4. HEENMT:  Head is atraumatic, normocephalic, pupils are reactive to light, mucous membranes are moist, neck is supple, trachea is midline  5. Respiratory : Lungs are clear to auscultation bilaterally  6. Cardiovascular : Heart rate and rhythm are regular, no murmurs rubs or gallops  7. Gastrointestinal:  Abdomen is soft, nondistended, nontender to palpation  8. Skin:  No acute lesions on limited skin exam  9.Musculoskeletal:  Trace peripheral edema, no acute deformity    Data Review:    CBC Recent Labs  Lab 05/23/20 2051  WBC 10.0  HGB 12.7  HCT 38.8  PLT 354  MCV 95.1  MCH 31.1  MCHC 32.7  RDW 13.0  LYMPHSABS 2.0  MONOABS 0.7  EOSABS 0.3  BASOSABS 0.0   ------------------------------------------------------------------------------------------------------------------  Results for  orders placed or performed during the hospital encounter of 05/23/20 (from the past 48 hour(s))  Troponin I (High Sensitivity)     Status: Abnormal   Collection Time: 05/23/20  8:51 PM  Result Value Ref Range  Troponin I (High Sensitivity) 36 (H) <18 ng/L    Comment: (NOTE) Elevated high sensitivity troponin I (hsTnI) values and significant  changes across serial measurements may suggest ACS but many other  chronic and acute conditions are known to elevate hsTnI results.  Refer to the "Links" section for chest pain algorithms and additional  guidance. Performed at [facility], [address] [place], [place] [address]   Basic metabolic panel     Status: Abnormal   Collection Time: 05/23/20  8:51 PM  Result Value Ref Range   Sodium 138 135 - 145 mmol/L   Potassium 4.0 3.5 - 5.1 mmol/L   Chloride 104 98 - 111 mmol/L   CO2 26 22 - 32 mmol/L   Glucose, Bld 161 (H) 70 - 99 mg/dL    Comment: Glucose reference range applies only to samples taken after fasting for at least 8 hours.   BUN 24 (H) 8 - 23 mg/dL   Creatinine, Ser 0.81 (H) 0.44 - 1.00 mg/dL   Calcium 8.6 (L) 8.9 - 10.3 mg/dL   GFR calc non Af Amer 56 (L) >60 mL/min   GFR calc Af Amer >60 >60 mL/min   Anion gap 8 5 - 15    Comment: Performed at [facility], [address], [place], [place] [address]  CBC with Differential     Status: None   Collection Time: 05/23/20  8:51 PM  Result Value Ref Range   WBC 10.0 4.0 - 10.5 K/uL   RBC 4.08 3.87 - 5.11 MIL/uL   Hemoglobin 12.7 12.0 - 15.0 g/dL   HCT 56.3 36 - 46 %   MCV 95.1 80.0 - 100.0 fL   MCH 31.1 26.0 - 34.0 pg   MCHC 32.7 30.0 - 36.0 g/dL   RDW 14.9 70.2 - 63.7 %   Platelets 354 150 - 400 K/uL   nRBC 0.0 0.0 - 0.2 %   Neutrophils Relative % 70 %   Neutro Abs 7.0 1.7 - 7.7 K/uL   Lymphocytes Relative 20 %   Lymphs Abs 2.0 0.7 - 4.0 K/uL   Monocytes Relative 7 %   Monocytes Absolute 0.7 0 - 1 K/uL   Eosinophils Relative 3 %   Eosinophils Absolute 0.3 0 - 0 K/uL    Basophils Relative 0 %   Basophils Absolute 0.0 0 - 0 K/uL   Immature Granulocytes 0 %   Abs Immature Granulocytes 0.04 0.00 - 0.07 K/uL    Comment: Performed at [facility], [address] [place], [place] [address]  Troponin I (High Sensitivity)     Status: Abnormal   Collection Time: 05/23/20 10:21 PM  Result Value Ref Range   Troponin I (High Sensitivity) 119 (HH) <18 ng/L    Comment: CRITICAL RESULT CALLED TO, READ BACK BY AND VERIFIED WITH: ANDREW,L @ 2312 ON 05/23/20 BY JUW (NOTE) Elevated high sensitivity troponin I (hsTnI) values and significant  changes across serial measurements may suggest ACS but many other  chronic and acute conditions are known to elevate hsTnI results.  Refer to the Links section for chest pain algorithms and additional  guidance. Performed at [facility], [address] [place], [place] [address]   Respiratory Panel by RT PCR (Flu A&B, Covid) - Nasopharyngeal Swab     Status: None   Collection Time: 05/24/20 12:48 AM   Specimen: Nasopharyngeal Swab  Result Value Ref Range   SARS Coronavirus 2 by RT PCR NEGATIVE NEGATIVE    Comment: (NOTE) SARS-CoV-2 target  nucleic acids are NOT DETECTED.  The SARS-CoV-2 RNA is generally detectable in upper respiratoy specimens during the acute phase of infection. The lowest concentration of SARS-CoV-2 viral copies this assay can detect is 131 copies/mL. A negative result does not preclude SARS-Cov-2 infection and should not be used as the sole basis for treatment or other patient management decisions. A negative result may occur with  improper specimen collection/handling, submission of specimen other than nasopharyngeal swab, presence of viral mutation(s) within the areas targeted by this assay, and inadequate number of viral copies (<131 copies/mL). A negative result must be combined with clinical observations, patient history, and epidemiological information. The expected result is Negative.  Fact Sheet  for Patients:  https://www.moore.com/  Fact Sheet for Healthcare Providers:  https://www.young.biz/  This test is no t yet approved or cleared by the [place] FDA and  has been authorized for detection and/or diagnosis of SARS-CoV-2 by FDA under an Emergency Use Authorization (EUA). This EUA will remain  in effect (meaning this test can be used) for the duration of the COVID-19 declaration under Section 564(b)(1) of the Act, 21 U.S.C. section 360bbb-3(b)(1), unless the authorization is terminated or revoked sooner.     Influenza A by PCR NEGATIVE NEGATIVE   Influenza B by PCR NEGATIVE NEGATIVE    Comment: (NOTE) The Xpert Xpress SARS-CoV-2/FLU/RSV assay is intended as an aid in  the diagnosis of influenza from Nasopharyngeal swab specimens and  should not be used as a sole basis for treatment. Nasal washings and  aspirates are unacceptable for Xpert Xpress SARS-CoV-2/FLU/RSV  testing.  Fact Sheet for Patients: https://www.moore.com/  Fact Sheet for Healthcare Providers: https://www.young.biz/  This test is not yet approved or cleared by the [place] FDA and  has been authorized for detection and/or diagnosis of SARS-CoV-2 by  FDA under an Emergency Use Authorization (EUA). This EUA will remain  in effect (meaning this test can be used) for the duration of the  Covid-19 declaration under Section 564(b)(1) of the Act, 21  U.S.C. section 360bbb-3(b)(1), unless the authorization is  terminated or revoked. Performed at [facility], [address] [place], [place] [address]     Chemistries  Recent Labs  Lab 05/23/20 2051  NA 138  K 4.0  CL 104  CO2 26  GLUCOSE 161*  BUN 24*  CREATININE 1.04*  CALCIUM 8.6*    ------------------------------------------------------------------------------------------------------------------  ------------------------------------------------------------------------------------------------------------------ GFR: Estimated Creatinine Clearance: 58.4 mL/min (A) (by C-G formula based on SCr of 1.04 mg/dL (H)). Liver Function Tests: No results for input(s): AST, ALT, ALKPHOS, BILITOT, PROT, ALBUMIN in the last 168 hours. No results for input(s): LIPASE, AMYLASE in the last 168 hours. No results for input(s): AMMONIA in the last 168 hours. Coagulation Profile: No results for input(s): INR, PROTIME in the last 168 hours. Cardiac Enzymes: No results for input(s): CKTOTAL, CKMB, CKMBINDEX, TROPONINI in the last 168 hours. BNP (last 3 results) No results for input(s): PROBNP in the last 8760 hours. HbA1C: No results for input(s): HGBA1C in the last 72 hours. CBG: No results for input(s): GLUCAP in the last 168 hours. Lipid Profile: No results for input(s): CHOL, HDL, LDLCALC, TRIG, CHOLHDL, LDLDIRECT in the last 72 hours. Thyroid Function Tests: No results for input(s): TSH, T4TOTAL, FREET4, T3FREE, THYROIDAB in the last 72 hours. Anemia Panel: No results for input(s): VITAMINB12, FOLATE, FERRITIN, TIBC, IRON, RETICCTPCT in the last 72 hours.  --------------------------------------------------------------------------------------------------------------- Urine analysis:    Component Value Date/Time   COLORURINE YELLOW 10/18/2018 2326  APPEARANCEUR HAZY (A) 10/18/2018 2326   LABSPEC 1.013 10/18/2018 2326   PHURINE 5.0 10/18/2018 2326   GLUCOSEU NEGATIVE 10/18/2018 2326   HGBUR SMALL (A) 10/18/2018 2326   BILIRUBINUR NEGATIVE 10/18/2018 2326   KETONESUR NEGATIVE 10/18/2018 2326   PROTEINUR NEGATIVE 10/18/2018 2326   UROBILINOGEN 0.2 11/05/2010 1716   NITRITE NEGATIVE 10/18/2018 2326   LEUKOCYTESUR LARGE (A) 10/18/2018 2326      Imaging Results:    DG  Chest Portable 1 View  Result Date: 05/23/2020 CLINICAL DATA:  Chest pain EXAM: PORTABLE CHEST 1 VIEW COMPARISON:  October 19, 2018 FINDINGS: The heart size and mediastinal contours are within normal limits. There is mild prominence of the central pulmonary vasculature. No large airspace consolidation or pleural effusion. No acute osseous abnormality. IMPRESSION: Mild pulmonary vascular congestion. Electronically Signed   By: Jonna Clark M.D.   On: 05/23/2020 23:42    My personal review of EKG: Rhythm NSR, Rate 81 /min, QTc 455 ,no Acute ST changes   Assessment & Plan:    Active Problems:   NSTEMI (non-ST elevated myocardial infarction) (HCC)   1. Chest pain 1. Left-sided chest pain, radiating down left arm to elbow 2. Increase in troponin from 36-118 3. Monitor on telemetry 4. EKG shows sinus rhythm with a rate of 81, QTc 455, no acute ischemic changes 5. Continue statin, continue aspirin, and beta-blocker for medical optimization 6. Heparin drip started 7. Continue to trend troponin 8. Transfer to [facility] as we do not have cardiology on the weekends 2. Hypertension 1. Continue home medication 3. Hyperlipidemia 1. Continue rosuvastatin 4. Diabetes mellitus type 2 1. Diet controlled 2. Patient recently taken off of Metformin because hemoglobin A1c was less than 7 3. Monitor CBG 4. Add sliding scale if needed 5. Hypothyroidism 1. Continue Synthroid   DVT Prophylaxis-   Heparin- SCDs   AM Labs Ordered, also please review Full Orders  Family Communication: No family at bedside Code Status: Full  Admission status: Inpatient :The appropriate admission status for this patient is INPATIENT. Inpatient status is judged to be reasonable and necessary in order to provide the required intensity of service to ensure the patient's safety. The patient's presenting symptoms, physical exam findings, and initial radiographic and laboratory data in the context of their chronic  comorbidities is felt to place them at high risk for further clinical deterioration. Furthermore, it is not anticipated that the patient will be medically stable for discharge from the hospital within 2 midnights of admission. The following factors support the admission status of inpatient.     The patient's presenting symptoms include chest pain The worrisome physical exam findings include chest pain not reproducible with palpation The initial radiographic and laboratory data are worrisome because of increasing troponin from the 30s to 100s The chronic co-morbidities include stroke, hypertension, hyperlipidemia, diabetes mellitus, depression       * I certify that at the point of admission it is my clinical judgment that the patient will require inpatient hospital care spanning beyond 2 midnights from the point of admission due to high intensity of service, high risk for further deterioration and high frequency of surveillance required.*  Time spent in minutes : 65   Greydis Stlouis B Zierle-Ghosh DO

## 2020-05-24 NOTE — Progress Notes (Signed)
Subjective: Patient admitted this morning, see detailed H&P by Dr Carren Rang 69 year old female with history of hypothyroidism, hyperlipidemia, hypertension, diabetes mellitus diet-controlled, came to ED with complaints of chest pain.  Patient says she was having spells for quite some time and this morning was for worse.  Pain started under her left breast with radiation down to left arm to elbow.  In the ED troponin went from 36-1 18.  Repeat troponin this morning was elevated to 6,628. Patient started on IV heparin, aspirin, Crestor  Vitals:   05/24/20 1130 05/24/20 1300  BP: 117/74 119/74  Pulse: 61 62  Resp: 12 12  Temp:    SpO2: 95% 94%      A/P  NSTEMI-patient troponin went up to 6628, she is currently not having any chest pain.  Repeat EKG shows no significant abnormality.  Called and discussed with cardiology Dr. Jens Som, who recommends to continue with current medical management with heparin, aspirin, statin, Coreg.  Patient has been assigned to [facility] and will be transferred to telemetry.  Cardiology will see patient once patient arrives at [facility].  Hypertension-continue Coreg  Hyperlipidemia-continue rosuvastatin  Diabetes mellitus type 2-continue sliding scale insulin NovoLog  Hypothyroidism-continue Synthroid   Patient will be transferred to [place] today. Meredeth Ide [facility] [phone]

## 2020-05-24 NOTE — ED Notes (Signed)
Pt ambulated to restroom, denies SOB or pain upon exertion

## 2020-05-24 NOTE — ED Notes (Signed)
Report given to [facility]  

## 2020-05-24 NOTE — Progress Notes (Signed)
*  PRELIMINARY RESULTS* Echocardiogram 2D Echocardiogram has been performed.  Stacey Drain 05/24/2020, 10:44 AM

## 2020-05-24 NOTE — Progress Notes (Signed)
Patient found to be in afib RVR during bedside report. HR 90-110, BP 129/71.  EKG obtained.  MD Carncelli paged to on coming RN's phone.

## 2020-05-24 NOTE — ED Notes (Signed)
CRITICAL VALUE ALERT  Critical Value:  Troponin 6,628  Date & Time Notied: 05/24/20 @ 1005  Provider Notified: Dr Sharl Ma   Orders Received/Actions taken: Repeat EKG and increase in heparin rate.

## 2020-05-24 NOTE — ED Notes (Signed)
Patient aware of admission room.

## 2020-05-24 NOTE — Progress Notes (Addendum)
Pt states she has an appointment outpatient 05/27/20 with primary MD and prefers to get flu shot at that time.

## 2020-05-24 NOTE — Progress Notes (Signed)
ANTICOAGULATION CONSULT NOTE - Initial Consult  Pharmacy Consult for heparin Indication: chest pain/ACS  Allergies  Allergen Reactions  . Tape Rash    Can only tolerate LIMITED exposure; no "plastic tape," please    Patient Measurements: Height: 5\' 5"  (165.1 cm) Weight: 90.7 kg (200 lb) IBW/kg (Calculated) : 57 Heparin Dosing Weight: 75kg  Vital Signs: Temp: 98.5 F (36.9 C) (10/01 1946) Temp Source: Oral (10/01 1946) BP: 120/79 (10/01 2330) Pulse Rate: 72 (10/01 2330)  Labs: Recent Labs    05/23/20 2051 05/23/20 2221  HGB 12.7  --   HCT 38.8  --   PLT 354  --   CREATININE 1.04*  --   TROPONINIHS 36* 119*    Estimated Creatinine Clearance: 58.4 mL/min (A) (by C-G formula based on SCr of 1.04 mg/dL (H)).   Medical History: Past Medical History:  Diagnosis Date  . Depression   . Diabetes mellitus without complication (HCC)   . Hyperlipidemia 10/19/2018  . Hypertension   . Stroke (HCC) 10/19/2018    Assessment: 68yo female c/o CP radiating to LUE and associated w/ mild SOB, troponin mildly elevated and increasing, to begin heparin.  Goal of Therapy:  Heparin level 0.3-0.7 units/ml Monitor platelets by anticoagulation protocol: Yes   Plan:  Will give heparin 4000 units IV bolus x1 followed by gtt at 1000 units/hr and monitor heparin levels and CBC.  10/21/2018, PharmD, BCPS  05/24/2020,12:08 AM

## 2020-05-24 NOTE — Consult Note (Signed)
Cardiology Consultation:   Patient ID: SATONYA LUX MRN: [id number]; DOB: 19-Aug-1951  Admit date: 05/23/2020 Date of Consult: 05/24/2020  Primary Care Provider: Gwenlyn Found, MD [facility] Cardiologist: New   Patient Profile:   [name] is a 69 y.o. female with a hx of diabetes mellitus, hyperlipidemia, hypertension, prior CVA who is being seen today for the evaluation of non-ST elevation myocardial infarction at the request of Mauro Kaufmann, MD.  History of Present Illness:   Patient has no prior cardiac history.  Over the past several years she has had occasions of chest discomfort in substernal area radiating to her left upper extremity that occurs with exertion and is relieved with rest.  Yesterday morning as she was washing dishes she developed the same pain but it did not resolve.  There was no associated nausea, diaphoresis or dyspnea.  The pain was not pleuritic.  It was described as a pressure.  She went to the [facility] emergency room and was given nitroglycerin with relief after 2 hours.  She has remained pain-free.  Cardiology now asked to evaluate after patient ruled in.   Past Medical History:  Diagnosis Date  . Depression   . Diabetes mellitus without complication (HCC)   . Hyperlipidemia 10/19/2018  . Hypertension   . Stroke [facility]) 10/19/2018    Past Surgical History:  Procedure Laterality Date  . APPENDECTOMY    . BACK SURGERY    . CHOLECYSTECTOMY    . VAGINAL HYSTERECTOMY       Inpatient Medications: Scheduled Meds: . aspirin EC  81 mg Oral Daily  . carvedilol  3.125 mg Oral BID WC  . citalopram  20 mg Oral Daily  . levothyroxine  100 mcg Oral QAC breakfast  . rosuvastatin  5 mg Oral QHS   Continuous Infusions: . heparin 1,100 Units/hr (05/24/20 1648)   PRN Meds: acetaminophen, ALPRAZolam, ondansetron (ZOFRAN) IV  Allergies:    Allergies  Allergen Reactions  . Tape Rash    Can only tolerate LIMITED exposure; no "plastic tape,"  please    Social History:   Social History   Socioeconomic History  . Marital status: Married    Spouse name: Not on file  . Number of children: Not on file  . Years of education: Not on file  . Highest education level: Not on file  Occupational History  . Not on file  Tobacco Use  . Smoking status: Never Smoker  . Smokeless tobacco: Never Used  Substance and Sexual Activity  . Alcohol use: No  . Drug use: No  . Sexual activity: Not on file  Other Topics Concern  . Not on file  Social History Narrative  . Not on file   Social Determinants of Health   Financial Resource Strain:   . Difficulty of Paying Living Expenses: Not on file  Food Insecurity:   . Worried About Programme researcher, broadcasting/film/video in the Last Year: Not on file  . Ran Out of Food in the Last Year: Not on file  Transportation Needs:   . Lack of Transportation (Medical): Not on file  . Lack of Transportation (Non-Medical): Not on file  Physical Activity:   . Days of Exercise per Week: Not on file  . Minutes of Exercise per Session: Not on file  Stress:   . Feeling of Stress : Not on file  Social Connections:   . Frequency of Communication with Friends and Family: Not on file  . Frequency of Social  Gatherings with Friends and Family: Not on file  . Attends Religious Services: Not on file  . Active Member of Clubs or Organizations: Not on file  . Attends Banker Meetings: Not on file  . Marital Status: Not on file  Intimate Partner Violence:   . Fear of Current or Ex-Partner: Not on file  . Emotionally Abused: Not on file  . Physically Abused: Not on file  . Sexually Abused: Not on file    Family History:    Family History  Problem Relation Age of Onset  . Colon cancer Father        died at 110  . Throat cancer Brother   . Breast cancer Sister        sister died at 44  . Coronary artery disease Mother        stent age 20, died at age 7 of complications from diabetes/heart disease  .  Diabetes Mother   . Arrhythmia Brother      ROS:  Please see the history of present illness.  No fevers, chills, productive cough, hemoptysis. All other ROS reviewed and negative.     Physical Exam/Data:   Vitals:   05/24/20 1130 05/24/20 1300 05/24/20 1410 05/24/20 1644  BP: 117/74 119/74 (!) 149/87 130/78  Pulse: 61 62 63 62  Resp: 12 12 18    Temp:   98.3 F (36.8 C)   TempSrc:   Oral   SpO2: 95% 94% 96%   Weight:      Height:        Intake/Output Summary (Last 24 hours) at 05/24/2020 1649 Last data filed at 05/24/2020 1600 Gross per 24 hour  Intake 197.83 ml  Output --  Net 197.83 ml   Last 3 Weights 05/23/2020 10/23/2018 10/18/2018  Weight (lbs) 200 lb 179 lb 180 lb  Weight (kg) 90.719 kg 81.194 kg 81.647 kg     Body mass index is 33.28 kg/m.  General:  Well nourished, well developed, in no acute distress HEENT: normal Lymph: no adenopathy Neck: no JVD Endocrine:  No thryomegaly Vascular: No carotid bruits; FA pulses 2+ bilaterally without bruits  Cardiac:  normal S1, S2; RRR; no murmur  Lungs:  clear to auscultation bilaterally, no wheezing, rhonchi or rales  Abd: soft, nontender, no hepatomegaly  Ext: no edema Musculoskeletal:  No deformities, BUE and BLE strength normal and equal Skin: warm and dry  Neuro:  CNs 2-12 intact, no focal abnormalities noted Psych:  Normal affect   EKG:  The EKG was personally reviewed and demonstrates: Normal sinus rhythm with no ST changes.   Laboratory Data:  High Sensitivity Troponin:   Recent Labs  Lab 05/23/20 2051 05/23/20 2221 05/24/20 0330 05/24/20 0835  TROPONINIHS 36* 119* 3,786* 6,628*     Chemistry Recent Labs  Lab 05/23/20 2051  NA 138  K 4.0  CL 104  CO2 26  GLUCOSE 161*  BUN 24*  CREATININE 1.04*  CALCIUM 8.6*  GFRNONAA 56*  GFRAA >60  ANIONGAP 8    Hematology Recent Labs  Lab 05/23/20 2051  WBC 10.0  RBC 4.08  HGB 12.7  HCT 38.8  MCV 95.1  MCH 31.1  MCHC 32.7  RDW 13.0  PLT 354      Radiology/Studies:  DG Chest Portable 1 View  Result Date: 05/23/2020 CLINICAL DATA:  Chest pain EXAM: PORTABLE CHEST 1 VIEW COMPARISON:  October 19, 2018 FINDINGS: The heart size and mediastinal contours are within normal limits. There is mild prominence  of the central pulmonary vasculature. No large airspace consolidation or pleural effusion. No acute osseous abnormality. IMPRESSION: Mild pulmonary vascular congestion. Electronically Signed   By: Jonna Clark M.D.   On: 05/23/2020 23:42   ECHOCARDIOGRAM COMPLETE  Result Date: 05/24/2020    ECHOCARDIOGRAM REPORT   Patient Name:   [name] Date of Exam: 05/24/2020 Medical Rec #:  [id number]      Height:       65.0 in Accession #:    [id number]     Weight:       200.0 lb Date of Birth:  [birth year]     BSA:          1.978 m Patient Age:    68 years       BP:           124/65 mmHg Patient Gender: F              HR:           67 bpm. [facility] Procedure: 2D Echo, Cardiac Doppler and Color Doppler Indications:    Chest Pain 786.50 / R07.9  History:        Patient has prior history of Echocardiogram examinations, most                 recent 10/19/2018. Previous Myocardial Infarction, TIA; Risk                 Factors:Hypertension, Diabetes and Dyslipidemia.  Sonographer:    Celesta Gentile RCS Referring Phys: [id number] ASIA B ZIERLE-GHOSH IMPRESSIONS  1. Left ventricular ejection fraction, by estimation, is 60 to 65%. The left ventricle has normal function. Left ventricular endocardial border not optimally defined to evaluate regional wall motion. Left ventricular diastolic parameters were normal.  2. Right ventricular systolic function is normal. The right ventricular size is normal. Tricuspid regurgitation signal is inadequate for assessing PA pressure.  3. The mitral valve is normal in structure. No evidence of mitral valve regurgitation. No evidence of mitral stenosis.  4. The aortic valve is tricuspid. Aortic valve regurgitation is  not visualized. Mild aortic valve sclerosis is present, with no evidence of aortic valve stenosis.  5. The inferior vena cava is normal in size with greater than 50% respiratory variability, suggesting right atrial pressure of 3 mmHg.  6. Recommend limited study with definity to delineate endocardial borders for assessment of wall motion. FINDINGS  Left Ventricle: Left ventricular ejection fraction, by estimation, is 60 to 65%. The left ventricle has normal function. Left ventricular endocardial border not optimally defined to evaluate regional wall motion. The left ventricular internal cavity size was normal in size. There is no left ventricular hypertrophy. Left ventricular diastolic parameters were normal. Normal left ventricular filling pressure. Right Ventricle: The right ventricular size is normal. No increase in right ventricular wall thickness. Right ventricular systolic function is normal. Tricuspid regurgitation signal is inadequate for assessing PA pressure. Left Atrium: Left atrial size was normal in size. Right Atrium: Right atrial size was normal in size. Pericardium: There is no evidence of pericardial effusion. Mitral Valve: The mitral valve is normal in structure. No evidence of mitral valve regurgitation. No evidence of mitral valve stenosis. Tricuspid Valve: The tricuspid valve is normal in structure. Tricuspid valve regurgitation is trivial. No evidence of tricuspid stenosis. Aortic Valve: The aortic valve is tricuspid. Aortic valve regurgitation is not visualized. Mild aortic valve sclerosis is present, with no evidence of aortic valve stenosis. Pulmonic Valve:  The pulmonic valve was normal in structure. Pulmonic valve regurgitation is trivial. No evidence of pulmonic stenosis. Aorta: The aortic root is normal in size and structure. Venous: The inferior vena cava is normal in size with greater than 50% respiratory variability, suggesting right atrial pressure of 3 mmHg. IAS/Shunts: The  interatrial septum appears to be lipomatous. No atrial level shunt detected by color flow Doppler.  LEFT VENTRICLE PLAX 2D LVIDd:         4.35 cm  Diastology LVIDs:         2.79 cm  LV e' medial:    9.03 cm/s LV PW:         1.30 cm  LV E/e' medial:  11.3 LV IVS:        0.88 cm  LV e' lateral:   9.57 cm/s LVOT diam:     2.00 cm  LV E/e' lateral: 10.7 LV SV:         75 LV SV Index:   38 LVOT Area:     3.14 cm  RIGHT VENTRICLE RV S prime:     9.90 cm/s TAPSE (M-mode): 1.6 cm LEFT ATRIUM             Index       RIGHT ATRIUM           Index LA diam:        3.70 cm 1.87 cm/m  RA Area:     14.00 cm LA Vol (A2C):   57.7 ml 29.17 ml/m RA Volume:   32.90 ml  16.63 ml/m LA Vol (A4C):   58.1 ml 29.37 ml/m LA Biplane Vol: 58.7 ml 29.67 ml/m  AORTIC VALVE LVOT Vmax:   103.00 cm/s LVOT Vmean:  66.600 cm/s LVOT VTI:    0.238 m  AORTA Ao Root diam: 3.20 cm MITRAL VALVE MV Area (PHT): 3.06 cm     SHUNTS MV Decel Time: 248 msec     Systemic VTI:  0.24 m MV E velocity: 102.00 cm/s  Systemic Diam: 2.00 cm MV A velocity: 115.00 cm/s MV E/A ratio:  0.89 Armanda Magicraci Turner MD Electronically signed by Armanda Magicraci Turner MD Signature Date/Time: 05/24/2020/11:10:22 AM    Final     TIMI Risk Score for Unstable Angina or Non-ST Elevation MI:   The patient's TIMI risk score is 4, which indicates a 20% risk of all cause mortality, new or recurrent myocardial infarction or need for urgent revascularization in the next 14 days.      Assessment and Plan:   1. Non-ST elevation myocardial infarction-patient is now pain-free but has ruled in for a non-ST elevation myocardial infarction.  Electrocardiogram shows no ST changes.  Echocardiogram shows normal LV function.  Plan to treat with aspirin, heparin, statin and beta-blocker.  Plan cardiac catheterization on Monday or sooner if she becomes unstable.  The risks and benefits of cardiac catheterization including myocardial infarction, CVA and death discussed and she agrees to  proceed. 2. Hyperlipidemia-given documented coronary artery disease will increase Crestor to 40 mg daily.  Check lipids and liver in 12 weeks. 3. Diabetes mellitus-follow CBGs while patient is in-house. 4. Hypertension-continue carvedilol at present dose.  Follow in-house and increase as needed.  For questions or updates, please contact [facility] Please consult [email or web address] for contact info under    Signed, Olga MillersBrian Wilferd Ritson, MD  05/24/2020 4:49 PM

## 2020-05-25 LAB — CBC
HCT: 43.9 % (ref 36.0–46.0)
Hemoglobin: 13.9 g/dL (ref 12.0–15.0)
MCH: 29.6 pg (ref 26.0–34.0)
MCHC: 31.7 g/dL (ref 30.0–36.0)
MCV: 93.6 fL (ref 80.0–100.0)
Platelets: 348 10*3/uL (ref 150–400)
RBC: 4.69 MIL/uL (ref 3.87–5.11)
RDW: 12.9 % (ref 11.5–15.5)
WBC: 10 10*3/uL (ref 4.0–10.5)
nRBC: 0 % (ref 0.0–0.2)

## 2020-05-25 LAB — GLUCOSE, CAPILLARY
Glucose-Capillary: 118 mg/dL — ABNORMAL HIGH (ref 70–99)
Glucose-Capillary: 112 mg/dL — ABNORMAL HIGH (ref 70–99)
Glucose-Capillary: 108 mg/dL — ABNORMAL HIGH (ref 70–99)
Glucose-Capillary: 158 mg/dL — ABNORMAL HIGH (ref 70–99)

## 2020-05-25 LAB — HEMOGLOBIN A1C
Hgb A1c MFr Bld: 6.3 % — ABNORMAL HIGH (ref 4.8–5.6)
Mean Plasma Glucose: 134.11 mg/dL

## 2020-05-25 LAB — HEPARIN LEVEL (UNFRACTIONATED)
Heparin Unfractionated: 0.22 IU/mL — ABNORMAL LOW (ref 0.30–0.70)
Heparin Unfractionated: 0.29 IU/mL — ABNORMAL LOW (ref 0.30–0.70)
Heparin Unfractionated: 0.46 IU/mL (ref 0.30–0.70)

## 2020-05-25 MED ORDER — SODIUM CHLORIDE 0.9 % WEIGHT BASED INFUSION: 1.0000 mL/kg/h | INTRAVENOUS | Status: DC

## 2020-05-25 MED ORDER — SENNOSIDES-DOCUSATE SODIUM 8.6-50 MG PO TABS: 1.0000 | ORAL_TABLET | Freq: Every evening | ORAL | Status: DC | PRN

## 2020-05-25 MED ORDER — ASPIRIN 81 MG PO CHEW
81.0000 mg | CHEWABLE_TABLET | ORAL | Status: AC
  Administered 2020-05-26: 81 mg via ORAL
  Filled 2020-05-25: qty 1

## 2020-05-25 MED ORDER — SODIUM CHLORIDE 0.9% FLUSH: 3.0000 mL | INTRAVENOUS | Status: DC | PRN

## 2020-05-25 MED ORDER — METOPROLOL TARTRATE 25 MG PO TABS
25.0000 mg | ORAL_TABLET | Freq: Two times a day (BID) | ORAL | Status: DC
  Administered 2020-05-25 – 2020-05-26 (×3): 25 mg via ORAL
  Filled 2020-05-25 (×3): qty 1

## 2020-05-25 MED ORDER — SODIUM CHLORIDE 0.9 % IV SOLN: 250.0000 mL | INTRAVENOUS | Status: DC | PRN

## 2020-05-25 MED ORDER — OXYCODONE HCL 5 MG PO TABS: 5.0000 mg | ORAL_TABLET | ORAL | Status: DC | PRN

## 2020-05-25 MED ORDER — SODIUM CHLORIDE 0.9% FLUSH
3.0000 mL | Freq: Two times a day (BID) | INTRAVENOUS | Status: DC
  Administered 2020-05-25 – 2020-05-26 (×3): 3 mL via INTRAVENOUS

## 2020-05-25 MED ORDER — SODIUM CHLORIDE 0.9 % WEIGHT BASED INFUSION
3.0000 mL/kg/h | INTRAVENOUS | Status: DC
  Administered 2020-05-26: 3 mL/kg/h via INTRAVENOUS

## 2020-05-25 NOTE — Progress Notes (Signed)
PROGRESS NOTE    [name]  FTD:[id number] DOB: 08/29/1950 DOA: 05/23/2020 PCP: Gwenlyn Found, MD   Brief Narrative:  69 year old with history of hypothyroidism, HLD, HTN, DM2 admitted for chest pain.  Noted to have elevated troponin started on IV heparin.  Initially presented to [facility] transferred to [facility] for further evaluation.  EKG was overall unremarkable.  Cardiology recommended left heart catheterization which will likely be on Monday.   Assessment & Plan:   Principal Problem:   NSTEMI (non-ST elevated myocardial infarction) (HCC) Active Problems:   Atypical chest pain   Diabetes type 2, controlled (HCC)   Hypertension   Hyperlipidemia   Non-ST elevation MI -Troponins initially went up to greater than 6000.  No acute EKG changes -Echocardiogram overall unremarkable with EF of 60% -Currently on Lopressor, aspirin, statin -Heparin drip -Plans for left heart catheterization on Monday -Cardiology team following -LDL-56, TSH pending.  Check A1c  Essential hypertension -Lopressor 25 mg twice daily  Hyperlipidemia -Crestor increased to 40 mg at bedtime.  Will require repeat lipid panel in about 2-3 months  Diabetes mellitus type 2 -Insulin sliding scale and Accu-Chek.  Not on any home meds?  Check A1c  Hypothyroidism -Continue Synthroid 100 mcg daily.  TSH pending    DVT prophylaxis: Heparin drip Code Status: Full code Family Communication:    Status is: Inpatient  Remains inpatient appropriate because:Inpatient level of care appropriate due to severity of illness   Dispo: The patient is from: Home              Anticipated d/c is to: Home              Anticipated d/c date is: 2 days              Patient currently is not medically stable to d/c.  Given the extent of troponin elevation and diagnosis of NSTEMI patient will require left heart catheterization on 05/26/2020.  Defer the timing of this to cardiology.   Body mass index is  33.34 kg/m.       Subjective: Overall feels okay no complaints.  Currently she is chest pain-free.  Denies having any previous cardiac work-up.   Examination:  Constitutional: Not in acute distress Respiratory: Clear to auscultation bilaterally Cardiovascular: Normal sinus rhythm, no rubs Abdomen: Nontender nondistended good bowel sounds Musculoskeletal: No edema noted Skin: No rashes seen Neurologic: CN 2-12 grossly intact.  And nonfocal Psychiatric: Normal judgment and insight. Alert and oriented x 3. Normal mood.  Objective: Vitals:   05/24/20 2106 05/24/20 2344 05/25/20 0000 05/25/20 0328  BP: 107/78 118/75 103/64 96/75  Pulse: 94  85   Resp: 17  14   Temp:  98 F (36.7 C) 99 F (37.2 C) 98.8 F (37.1 C)  TempSrc:  Oral Oral Oral  SpO2: 91%  92%   Weight:    90.9 kg  Height:        Intake/Output Summary (Last 24 hours) at 05/25/2020 0817 Last data filed at 05/25/2020 0328 Gross per 24 hour  Intake 572.85 ml  Output 1250 ml  Net -677.15 ml   Filed Weights   05/23/20 1943 05/25/20 0328  Weight: 90.7 kg 90.9 kg     Data Reviewed:   CBC: Recent Labs  Lab 05/23/20 2051 05/25/20 0238  WBC 10.0 10.0  NEUTROABS 7.0  --   HGB 12.7 13.9  HCT 38.8 43.9  MCV 95.1 93.6  PLT 354 348   Basic Metabolic Panel:  Recent Labs  Lab 05/23/20 2051  NA 138  K 4.0  CL 104  CO2 26  GLUCOSE 161*  BUN 24*  CREATININE 1.04*  CALCIUM 8.6*   GFR: Estimated Creatinine Clearance: 58.5 mL/min (A) (by C-G formula based on SCr of 1.04 mg/dL (H)). Liver Function Tests: No results for input(s): AST, ALT, ALKPHOS, BILITOT, PROT, ALBUMIN in the last 168 hours. No results for input(s): LIPASE, AMYLASE in the last 168 hours. No results for input(s): AMMONIA in the last 168 hours. Coagulation Profile: No results for input(s): INR, PROTIME in the last 168 hours. Cardiac Enzymes: No results for input(s): CKTOTAL, CKMB, CKMBINDEX, TROPONINI in the last 168 hours. BNP (last  3 results) No results for input(s): PROBNP in the last 8760 hours. HbA1C: No results for input(s): HGBA1C in the last 72 hours. CBG: Recent Labs  Lab 05/24/20 1729 05/24/20 2057  GLUCAP 112* 119*   Lipid Profile: Recent Labs    05/24/20 0343  CHOL 115  HDL 39*  LDLCALC 56  TRIG 161102  CHOLHDL 2.9   Thyroid Function Tests: No results for input(s): TSH, T4TOTAL, FREET4, T3FREE, THYROIDAB in the last 72 hours. Anemia Panel: No results for input(s): VITAMINB12, FOLATE, FERRITIN, TIBC, IRON, RETICCTPCT in the last 72 hours. Sepsis Labs: No results for input(s): PROCALCITON, LATICACIDVEN in the last 168 hours.  Recent Results (from the past 240 hour(s))  Respiratory Panel by RT PCR (Flu A&B, Covid) - Nasopharyngeal Swab     Status: None   Collection Time: 05/24/20 12:48 AM   Specimen: Nasopharyngeal Swab  Result Value Ref Range Status   SARS Coronavirus 2 by RT PCR NEGATIVE NEGATIVE Final    Comment: (NOTE) SARS-CoV-2 target nucleic acids are NOT DETECTED.  The SARS-CoV-2 RNA is generally detectable in upper respiratoy specimens during the acute phase of infection. The lowest concentration of SARS-CoV-2 viral copies this assay can detect is 131 copies/mL. A negative result does not preclude SARS-Cov-2 infection and should not be used as the sole basis for treatment or other patient management decisions. A negative result may occur with  improper specimen collection/handling, submission of specimen other than nasopharyngeal swab, presence of viral mutation(s) within the areas targeted by this assay, and inadequate number of viral copies (<131 copies/mL). A negative result must be combined with clinical observations, patient history, and epidemiological information. The expected result is Negative.  Fact Sheet for Patients:  https://www.moore.com/https://www.fda.gov/media/142436/download  Fact Sheet for Healthcare Providers:  https://www.young.biz/https://www.fda.gov/media/142435/download  This test is no t yet  approved or cleared by the [place] FDA and  has been authorized for detection and/or diagnosis of SARS-CoV-2 by FDA under an Emergency Use Authorization (EUA). This EUA will remain  in effect (meaning this test can be used) for the duration of the COVID-19 declaration under Section 564(b)(1) of the Act, 21 U.S.C. section 360bbb-3(b)(1), unless the authorization is terminated or revoked sooner.     Influenza A by PCR NEGATIVE NEGATIVE Final   Influenza B by PCR NEGATIVE NEGATIVE Final    Comment: (NOTE) The Xpert Xpress SARS-CoV-2/FLU/RSV assay is intended as an aid in  the diagnosis of influenza from Nasopharyngeal swab specimens and  should not be used as a sole basis for treatment. Nasal washings and  aspirates are unacceptable for Xpert Xpress SARS-CoV-2/FLU/RSV  testing.  Fact Sheet for Patients: https://www.moore.com/https://www.fda.gov/media/142436/download  Fact Sheet for Healthcare Providers: https://www.young.biz/https://www.fda.gov/media/142435/download  This test is not yet approved or cleared by the [place] FDA and  has been authorized for detection and/or  diagnosis of SARS-CoV-2 by  FDA under an Emergency Use Authorization (EUA). This EUA will remain  in effect (meaning this test can be used) for the duration of the  Covid-19 declaration under Section 564(b)(1) of the Act, 21  U.S.C. section 360bbb-3(b)(1), unless the authorization is  terminated or revoked. Performed at [facility], [address] [place], [place] [address]          Radiology Studies: DG Chest Portable 1 View  Result Date: 05/23/2020 CLINICAL DATA:  Chest pain EXAM: PORTABLE CHEST 1 VIEW COMPARISON:  October 19, 2018 FINDINGS: The heart size and mediastinal contours are within normal limits. There is mild prominence of the central pulmonary vasculature. No large airspace consolidation or pleural effusion. No acute osseous abnormality. IMPRESSION: Mild pulmonary vascular congestion. Electronically Signed   By: Jonna Clark  M.D.   On: 05/23/2020 23:42   ECHOCARDIOGRAM COMPLETE  Result Date: 05/24/2020    ECHOCARDIOGRAM REPORT   Patient Name:   KANOE WANNER Date of Exam: 05/24/2020 Medical Rec #:  [id number]      Height:       65.0 in Accession #:    [id number]     Weight:       200.0 lb Date of Birth:  [birth year]     BSA:          1.978 m Patient Age:    68 years       BP:           124/65 mmHg Patient Gender: F              HR:           67 bpm. [facility] Procedure: 2D Echo, Cardiac Doppler and Color Doppler Indications:    Chest Pain 786.50 / R07.9  History:        Patient has prior history of Echocardiogram examinations, most                 recent 10/19/2018. Previous Myocardial Infarction, TIA; Risk                 Factors:Hypertension, Diabetes and Dyslipidemia.  Sonographer:    Celesta Gentile RCS Referring Phys: [id number] ASIA B ZIERLE-GHOSH IMPRESSIONS  1. Left ventricular ejection fraction, by estimation, is 60 to 65%. The left ventricle has normal function. Left ventricular endocardial border not optimally defined to evaluate regional wall motion. Left ventricular diastolic parameters were normal.  2. Right ventricular systolic function is normal. The right ventricular size is normal. Tricuspid regurgitation signal is inadequate for assessing PA pressure.  3. The mitral valve is normal in structure. No evidence of mitral valve regurgitation. No evidence of mitral stenosis.  4. The aortic valve is tricuspid. Aortic valve regurgitation is not visualized. Mild aortic valve sclerosis is present, with no evidence of aortic valve stenosis.  5. The inferior vena cava is normal in size with greater than 50% respiratory variability, suggesting right atrial pressure of 3 mmHg.  6. Recommend limited study with definity to delineate endocardial borders for assessment of wall motion. FINDINGS  Left Ventricle: Left ventricular ejection fraction, by estimation, is 60 to 65%. The left ventricle has normal function. Left  ventricular endocardial border not optimally defined to evaluate regional wall motion. The left ventricular internal cavity size was normal in size. There is no left ventricular hypertrophy. Left ventricular diastolic parameters were normal. Normal left ventricular filling pressure. Right Ventricle: The right ventricular size is normal. No increase  in right ventricular wall thickness. Right ventricular systolic function is normal. Tricuspid regurgitation signal is inadequate for assessing PA pressure. Left Atrium: Left atrial size was normal in size. Right Atrium: Right atrial size was normal in size. Pericardium: There is no evidence of pericardial effusion. Mitral Valve: The mitral valve is normal in structure. No evidence of mitral valve regurgitation. No evidence of mitral valve stenosis. Tricuspid Valve: The tricuspid valve is normal in structure. Tricuspid valve regurgitation is trivial. No evidence of tricuspid stenosis. Aortic Valve: The aortic valve is tricuspid. Aortic valve regurgitation is not visualized. Mild aortic valve sclerosis is present, with no evidence of aortic valve stenosis. Pulmonic Valve: The pulmonic valve was normal in structure. Pulmonic valve regurgitation is trivial. No evidence of pulmonic stenosis. Aorta: The aortic root is normal in size and structure. Venous: The inferior vena cava is normal in size with greater than 50% respiratory variability, suggesting right atrial pressure of 3 mmHg. IAS/Shunts: The interatrial septum appears to be lipomatous. No atrial level shunt detected by color flow Doppler.  LEFT VENTRICLE PLAX 2D LVIDd:         4.35 cm  Diastology LVIDs:         2.79 cm  LV e' medial:    9.03 cm/s LV PW:         1.30 cm  LV E/e' medial:  11.3 LV IVS:        0.88 cm  LV e' lateral:   9.57 cm/s LVOT diam:     2.00 cm  LV E/e' lateral: 10.7 LV SV:         75 LV SV Index:   38 LVOT Area:     3.14 cm  RIGHT VENTRICLE RV S prime:     9.90 cm/s TAPSE (M-mode): 1.6 cm LEFT  ATRIUM             Index       RIGHT ATRIUM           Index LA diam:        3.70 cm 1.87 cm/m  RA Area:     14.00 cm LA Vol (A2C):   57.7 ml 29.17 ml/m RA Volume:   32.90 ml  16.63 ml/m LA Vol (A4C):   58.1 ml 29.37 ml/m LA Biplane Vol: 58.7 ml 29.67 ml/m  AORTIC VALVE LVOT Vmax:   103.00 cm/s LVOT Vmean:  66.600 cm/s LVOT VTI:    0.238 m  AORTA Ao Root diam: 3.20 cm MITRAL VALVE MV Area (PHT): 3.06 cm     SHUNTS MV Decel Time: 248 msec     Systemic VTI:  0.24 m MV E velocity: 102.00 cm/s  Systemic Diam: 2.00 cm MV A velocity: 115.00 cm/s MV E/A ratio:  0.89 Armanda Magic MD Electronically signed by Armanda Magic MD Signature Date/Time: 05/24/2020/11:10:22 AM    Final         Scheduled Meds: . aspirin EC  81 mg Oral Daily  . citalopram  20 mg Oral Daily  . levothyroxine  100 mcg Oral QAC breakfast  . metoprolol tartrate  25 mg Oral BID  . rosuvastatin  40 mg Oral QHS  . sodium chloride flush  3 mL Intravenous Q12H   Continuous Infusions: . heparin 1,300 Units/hr (05/25/20 0418)     LOS: 1 day   Time spent= 35 mins    Martie Fulgham Joline Maxcy, MD [facility]  If 7PM-7AM, please contact night-coverage  05/25/2020, 8:17 AM

## 2020-05-25 NOTE — Progress Notes (Addendum)
ANTICOAGULATION CONSULT NOTE   Pharmacy Consult for heparin Indication: chest pain/ACS  Assessment: 69yo female c/o CP radiating to LUE and associated w/ mild SOB, troponin mildly elevated and increasing, to begin heparin.  Patient transferred from [facility].  Heparin level this am 0.22 units/ml  Goal of Therapy:  Heparin level 0.3-0.7 units/ml Monitor platelets by anticoagulation protocol: Yes   Plan:  Increase heparin IV to 1300 units/hr  Recheck heparin level in 6 hours Continue to monitor for signs and symptoms of bleeding   Thanks for allowing pharmacy to be a part of this patient's care.  Talbert Cage, PharmD Clinical Pharmacist 05/25/2020,3:57 AM

## 2020-05-25 NOTE — Progress Notes (Signed)
ANTICOAGULATION CONSULT NOTE  Pharmacy Consult for heparin Indication: chest pain/ACS  Patient Measurements: Height: 5\' 5"  (165.1 cm) Weight: 90.9 kg (200 lb 5.4 oz) IBW/kg (Calculated) : 57 Heparin Dosing Weight: 77.1 kg  Vital Signs: Temp: 98.3 F (36.8 C) (10/03 1600) Temp Source: Oral (10/03 1600) BP: 109/73 (10/03 1600) Pulse Rate: 99 (10/03 1600)  Labs: Recent Labs    05/23/20 2051 05/23/20 2051 05/23/20 2221 05/24/20 0330 05/24/20 0835 05/24/20 0835 05/25/20 0238 05/25/20 1040 05/25/20 1848  HGB 12.7  --   --   --   --   --  13.9  --   --   HCT 38.8  --   --   --   --   --  43.9  --   --   PLT 354  --   --   --   --   --  348  --   --   HEPARINUNFRC  --   --   --   --  0.29*   < > 0.22* 0.29* 0.46  CREATININE 1.04*  --   --   --   --   --   --   --   --   TROPONINIHS 36*   < > 119* 3,786* 6,628*  --   --   --   --    < > = values in this interval not displayed.   Estimated Creatinine Clearance: 58.5 mL/min (A) (by C-G formula based on SCr of 1.04 mg/dL (H)).  Medical History: Past Medical History:  Diagnosis Date  . Depression   . Diabetes mellitus without complication (HCC)   . Hyperlipidemia 10/19/2018  . Hypertension   . Stroke (HCC) 10/19/2018   Medications:  Infusions:  . heparin 1,500 Units/hr (05/25/20 1453)   Assessment: 69yo female c/o CP radiating to LUE and associated w/ mild SOB, troponin mildly elevated and increasing, to begin heparin. Transferred from [facility].   Heparin level is now therapeutic on 1500 units/hr of IV heparin. RN reports no s/s of bleeding   Goal of Therapy:  Heparin level 0.3-0.7 units/ml Monitor platelets by anticoagulation protocol: Yes   Plan:  Continue heparin IV at 1500 units/hr F/u AM HL  Continue to monitor for s/sx bleeding  07/25/20, PharmD., BCPS, BCCCP Clinical Pharmacist  Please check [email or web address] for unit-specific pharmacy phone numbers.

## 2020-05-25 NOTE — H&P (View-Only) (Signed)
Progress Note  Patient Name: [name] Date of Encounter: 05/25/2020  [facility] Cardiologist: Dr Jens Som  Subjective   No CP or dyspnea  Inpatient Medications    Scheduled Meds: . aspirin EC  81 mg Oral Daily  . carvedilol  3.125 mg Oral BID WC  . citalopram  20 mg Oral Daily  . levothyroxine  100 mcg Oral QAC breakfast  . rosuvastatin  40 mg Oral QHS  . sodium chloride flush  3 mL Intravenous Q12H   Continuous Infusions: . diltiazem (CARDIZEM) infusion 5 mg/hr (05/24/20 2055)  . heparin 1,300 Units/hr (05/25/20 0418)   PRN Meds: acetaminophen, ALPRAZolam, ondansetron (ZOFRAN) IV   Vital Signs    Vitals:   05/24/20 2106 05/24/20 2344 05/25/20 0000 05/25/20 0328  BP: 107/78 118/75 103/64 96/75  Pulse: 94  85   Resp: 17  14   Temp:  98 F (36.7 C) 99 F (37.2 C) 98.8 F (37.1 C)  TempSrc:  Oral Oral Oral  SpO2: 91%  92%   Weight:    90.9 kg  Height:        Intake/Output Summary (Last 24 hours) at 05/25/2020 0756 Last data filed at 05/25/2020 0328 Gross per 24 hour  Intake 572.85 ml  Output 1250 ml  Net -677.15 ml   Last 3 Weights 05/25/2020 05/23/2020 10/23/2018  Weight (lbs) 200 lb 5.4 oz 200 lb 179 lb  Weight (kg) 90.872 kg 90.719 kg 81.194 kg      Telemetry    Sinus converting to atrial fibrillation- Personally Reviewed  Physical Exam   GEN: No acute distress.   Neck: No JVD Cardiac:  Irregular Respiratory: Clear to auscultation bilaterally. GI: Soft, nontender, non-distended  MS: No edema Neuro:  Nonfocal  Psych: Normal affect   Labs    High Sensitivity Troponin:   Recent Labs  Lab 05/23/20 2051 05/23/20 2221 05/24/20 0330 05/24/20 0835  TROPONINIHS 36* 119* 3,786* 6,628*      Chemistry Recent Labs  Lab 05/23/20 2051  NA 138  K 4.0  CL 104  CO2 26  GLUCOSE 161*  BUN 24*  CREATININE 1.04*  CALCIUM 8.6*  GFRNONAA 56*  GFRAA >60  ANIONGAP 8     Hematology Recent Labs  Lab 05/23/20 2051 05/25/20 0238  WBC  10.0 10.0  RBC 4.08 4.69  HGB 12.7 13.9  HCT 38.8 43.9  MCV 95.1 93.6  MCH 31.1 29.6  MCHC 32.7 31.7  RDW 13.0 12.9  PLT 354 348    Radiology    DG Chest Portable 1 View  Result Date: 05/23/2020 CLINICAL DATA:  Chest pain EXAM: PORTABLE CHEST 1 VIEW COMPARISON:  October 19, 2018 FINDINGS: The heart size and mediastinal contours are within normal limits. There is mild prominence of the central pulmonary vasculature. No large airspace consolidation or pleural effusion. No acute osseous abnormality. IMPRESSION: Mild pulmonary vascular congestion. Electronically Signed   By: Jonna Clark M.D.   On: 05/23/2020 23:42   ECHOCARDIOGRAM COMPLETE  Result Date: 05/24/2020    ECHOCARDIOGRAM REPORT   Patient Name:   [name] Date of Exam: 05/24/2020 Medical Rec #:  [id number]      Height:       65.0 in Accession #:    [id number]     Weight:       200.0 lb Date of Birth:  20-Feb-1951     BSA:          1.978 m Patient Age:  68 years       BP:           124/65 mmHg Patient Gender: F              HR:           67 bpm. [facility] Procedure: 2D Echo, Cardiac Doppler and Color Doppler Indications:    Chest Pain 786.50 / R07.9  History:        Patient has prior history of Echocardiogram examinations, most                 recent 10/19/2018. Previous Myocardial Infarction, TIA; Risk                 Factors:Hypertension, Diabetes and Dyslipidemia.  Sonographer:    Celesta Gentile RCS Referring Phys: [id number] ASIA B ZIERLE-GHOSH IMPRESSIONS  1. Left ventricular ejection fraction, by estimation, is 60 to 65%. The left ventricle has normal function. Left ventricular endocardial border not optimally defined to evaluate regional wall motion. Left ventricular diastolic parameters were normal.  2. Right ventricular systolic function is normal. The right ventricular size is normal. Tricuspid regurgitation signal is inadequate for assessing PA pressure.  3. The mitral valve is normal in structure. No evidence  of mitral valve regurgitation. No evidence of mitral stenosis.  4. The aortic valve is tricuspid. Aortic valve regurgitation is not visualized. Mild aortic valve sclerosis is present, with no evidence of aortic valve stenosis.  5. The inferior vena cava is normal in size with greater than 50% respiratory variability, suggesting right atrial pressure of 3 mmHg.  6. Recommend limited study with definity to delineate endocardial borders for assessment of wall motion. FINDINGS  Left Ventricle: Left ventricular ejection fraction, by estimation, is 60 to 65%. The left ventricle has normal function. Left ventricular endocardial border not optimally defined to evaluate regional wall motion. The left ventricular internal cavity size was normal in size. There is no left ventricular hypertrophy. Left ventricular diastolic parameters were normal. Normal left ventricular filling pressure. Right Ventricle: The right ventricular size is normal. No increase in right ventricular wall thickness. Right ventricular systolic function is normal. Tricuspid regurgitation signal is inadequate for assessing PA pressure. Left Atrium: Left atrial size was normal in size. Right Atrium: Right atrial size was normal in size. Pericardium: There is no evidence of pericardial effusion. Mitral Valve: The mitral valve is normal in structure. No evidence of mitral valve regurgitation. No evidence of mitral valve stenosis. Tricuspid Valve: The tricuspid valve is normal in structure. Tricuspid valve regurgitation is trivial. No evidence of tricuspid stenosis. Aortic Valve: The aortic valve is tricuspid. Aortic valve regurgitation is not visualized. Mild aortic valve sclerosis is present, with no evidence of aortic valve stenosis. Pulmonic Valve: The pulmonic valve was normal in structure. Pulmonic valve regurgitation is trivial. No evidence of pulmonic stenosis. Aorta: The aortic root is normal in size and structure. Venous: The inferior vena cava is  normal in size with greater than 50% respiratory variability, suggesting right atrial pressure of 3 mmHg. IAS/Shunts: The interatrial septum appears to be lipomatous. No atrial level shunt detected by color flow Doppler.  LEFT VENTRICLE PLAX 2D LVIDd:         4.35 cm  Diastology LVIDs:         2.79 cm  LV e' medial:    9.03 cm/s LV PW:         1.30 cm  LV E/e' medial:  11.3 LV IVS:  0.88 cm  LV e' lateral:   9.57 cm/s LVOT diam:     2.00 cm  LV E/e' lateral: 10.7 LV SV:         75 LV SV Index:   38 LVOT Area:     3.14 cm  RIGHT VENTRICLE RV S prime:     9.90 cm/s TAPSE (M-mode): 1.6 cm LEFT ATRIUM             Index       RIGHT ATRIUM           Index LA diam:        3.70 cm 1.87 cm/m  RA Area:     14.00 cm LA Vol (A2C):   57.7 ml 29.17 ml/m RA Volume:   32.90 ml  16.63 ml/m LA Vol (A4C):   58.1 ml 29.37 ml/m LA Biplane Vol: 58.7 ml 29.67 ml/m  AORTIC VALVE LVOT Vmax:   103.00 cm/s LVOT Vmean:  66.600 cm/s LVOT VTI:    0.238 m  AORTA Ao Root diam: 3.20 cm MITRAL VALVE MV Area (PHT): 3.06 cm     SHUNTS MV Decel Time: 248 msec     Systemic VTI:  0.24 m MV E velocity: 102.00 cm/s  Systemic Diam: 2.00 cm MV A velocity: 115.00 cm/s MV E/A ratio:  0.89 Armanda Magic MD Electronically signed by Armanda Magic MD Signature Date/Time: 05/24/2020/11:10:22 AM    Final     Patient Profile     69 y.o. female admitted with non-ST elevation myocardial infarction.  Echocardiogram shows normal LV function and no significant valve regurgitation.  Assessment & Plan    1 non-ST elevation myocardial infarction-patient remains pain-free.  Plan is for cardiac catheterization tomorrow.  The risk and benefits previously discussed including myocardial infarction, CVA and death and she agrees to proceed.  Echocardiogram shows normal LV function.  Continue aspirin, heparin, statin and beta-blocker.  2 hyperlipidemia-Crestor increased to 40 mg daily at time of admission.  Will need lipids and liver in 12 weeks.  3  hypertension-blood pressure controlled.  Continue present medications and follow.  4 paroxysmal atrial fibrillation-patient developed atrial fibrillation overnight.  However she is asymptomatic.  Her heart rate is controlled; I will discontinue Cardizem and carvedilol and instead treat with Lopressor 25 mg twice daily.  Follow heart rate and advance as needed.  Check TSH.  Echocardiogram shows normal LV function. CHADSvasc 7.  She will need apixaban once all procedures complete.  5 diabetes mellitus-management per primary care.  For questions or updates, please contact [facility] Please consult [email or web address] for contact info under        Signed, Olga Millers, MD  05/25/2020, 7:56 AM

## 2020-05-25 NOTE — Progress Notes (Addendum)
Progress Note  Patient Name: BROOKS STOTZ Date of Encounter: 05/25/2020  [facility] Cardiologist: Dr Jens Som  Subjective   No CP or dyspnea  Inpatient Medications    Scheduled Meds: . aspirin EC  81 mg Oral Daily  . carvedilol  3.125 mg Oral BID WC  . citalopram  20 mg Oral Daily  . levothyroxine  100 mcg Oral QAC breakfast  . rosuvastatin  40 mg Oral QHS  . sodium chloride flush  3 mL Intravenous Q12H   Continuous Infusions: . diltiazem (CARDIZEM) infusion 5 mg/hr (05/24/20 2055)  . heparin 1,300 Units/hr (05/25/20 0418)   PRN Meds: acetaminophen, ALPRAZolam, ondansetron (ZOFRAN) IV   Vital Signs    Vitals:   05/24/20 2106 05/24/20 2344 05/25/20 0000 05/25/20 0328  BP: 107/78 118/75 103/64 96/75  Pulse: 94  85   Resp: 17  14   Temp:  98 F (36.7 C) 99 F (37.2 C) 98.8 F (37.1 C)  TempSrc:  Oral Oral Oral  SpO2: 91%  92%   Weight:    90.9 kg  Height:        Intake/Output Summary (Last 24 hours) at 05/25/2020 0756 Last data filed at 05/25/2020 0328 Gross per 24 hour  Intake 572.85 ml  Output 1250 ml  Net -677.15 ml   Last 3 Weights 05/25/2020 05/23/2020 10/23/2018  Weight (lbs) 200 lb 5.4 oz 200 lb 179 lb  Weight (kg) 90.872 kg 90.719 kg 81.194 kg      Telemetry    Sinus converting to atrial fibrillation- Personally Reviewed  Physical Exam   GEN: No acute distress.   Neck: No JVD Cardiac:  Irregular Respiratory: Clear to auscultation bilaterally. GI: Soft, nontender, non-distended  MS: No edema Neuro:  Nonfocal  Psych: Normal affect   Labs    High Sensitivity Troponin:   Recent Labs  Lab 05/23/20 2051 05/23/20 2221 05/24/20 0330 05/24/20 0835  TROPONINIHS 36* 119* 3,786* 6,628*      Chemistry Recent Labs  Lab 05/23/20 2051  NA 138  K 4.0  CL 104  CO2 26  GLUCOSE 161*  BUN 24*  CREATININE 1.04*  CALCIUM 8.6*  GFRNONAA 56*  GFRAA >60  ANIONGAP 8     Hematology Recent Labs  Lab 05/23/20 2051 05/25/20 0238  WBC  10.0 10.0  RBC 4.08 4.69  HGB 12.7 13.9  HCT 38.8 43.9  MCV 95.1 93.6  MCH 31.1 29.6  MCHC 32.7 31.7  RDW 13.0 12.9  PLT 354 348    Radiology    DG Chest Portable 1 View  Result Date: 05/23/2020 CLINICAL DATA:  Chest pain EXAM: PORTABLE CHEST 1 VIEW COMPARISON:  October 19, 2018 FINDINGS: The heart size and mediastinal contours are within normal limits. There is mild prominence of the central pulmonary vasculature. No large airspace consolidation or pleural effusion. No acute osseous abnormality. IMPRESSION: Mild pulmonary vascular congestion. Electronically Signed   By: Jonna Clark M.D.   On: 05/23/2020 23:42   ECHOCARDIOGRAM COMPLETE  Result Date: 05/24/2020    ECHOCARDIOGRAM REPORT   Patient Name:   KORINNA TAT Date of Exam: 05/24/2020 Medical Rec #:  [id number]      Height:       65.0 in Accession #:    [id number]     Weight:       200.0 lb Date of Birth:  20-Feb-1951     BSA:          1.978 m Patient Age:  69 years       BP:           124/65 mmHg Patient Gender: F              HR:           67 bpm. [facility] Procedure: 2D Echo, Cardiac Doppler and Color Doppler Indications:    Chest Pain 786.50 / R07.9  History:        Patient has prior history of Echocardiogram examinations, most                 recent 10/19/2018. Previous Myocardial Infarction, TIA; Risk                 Factors:Hypertension, Diabetes and Dyslipidemia.  Sonographer:    Celesta Gentile RCS Referring Phys: [id number] ASIA B ZIERLE-GHOSH IMPRESSIONS  1. Left ventricular ejection fraction, by estimation, is 60 to 65%. The left ventricle has normal function. Left ventricular endocardial border not optimally defined to evaluate regional wall motion. Left ventricular diastolic parameters were normal.  2. Right ventricular systolic function is normal. The right ventricular size is normal. Tricuspid regurgitation signal is inadequate for assessing PA pressure.  3. The mitral valve is normal in structure. No evidence  of mitral valve regurgitation. No evidence of mitral stenosis.  4. The aortic valve is tricuspid. Aortic valve regurgitation is not visualized. Mild aortic valve sclerosis is present, with no evidence of aortic valve stenosis.  5. The inferior vena cava is normal in size with greater than 50% respiratory variability, suggesting right atrial pressure of 3 mmHg.  6. Recommend limited study with definity to delineate endocardial borders for assessment of wall motion. FINDINGS  Left Ventricle: Left ventricular ejection fraction, by estimation, is 60 to 65%. The left ventricle has normal function. Left ventricular endocardial border not optimally defined to evaluate regional wall motion. The left ventricular internal cavity size was normal in size. There is no left ventricular hypertrophy. Left ventricular diastolic parameters were normal. Normal left ventricular filling pressure. Right Ventricle: The right ventricular size is normal. No increase in right ventricular wall thickness. Right ventricular systolic function is normal. Tricuspid regurgitation signal is inadequate for assessing PA pressure. Left Atrium: Left atrial size was normal in size. Right Atrium: Right atrial size was normal in size. Pericardium: There is no evidence of pericardial effusion. Mitral Valve: The mitral valve is normal in structure. No evidence of mitral valve regurgitation. No evidence of mitral valve stenosis. Tricuspid Valve: The tricuspid valve is normal in structure. Tricuspid valve regurgitation is trivial. No evidence of tricuspid stenosis. Aortic Valve: The aortic valve is tricuspid. Aortic valve regurgitation is not visualized. Mild aortic valve sclerosis is present, with no evidence of aortic valve stenosis. Pulmonic Valve: The pulmonic valve was normal in structure. Pulmonic valve regurgitation is trivial. No evidence of pulmonic stenosis. Aorta: The aortic root is normal in size and structure. Venous: The inferior vena cava is  normal in size with greater than 50% respiratory variability, suggesting right atrial pressure of 3 mmHg. IAS/Shunts: The interatrial septum appears to be lipomatous. No atrial level shunt detected by color flow Doppler.  LEFT VENTRICLE PLAX 2D LVIDd:         4.35 cm  Diastology LVIDs:         2.79 cm  LV e' medial:    9.03 cm/s LV PW:         1.30 cm  LV E/e' medial:  11.3 LV IVS:  0.88 cm  LV e' lateral:   9.57 cm/s LVOT diam:     2.00 cm  LV E/e' lateral: 10.7 LV SV:         75 LV SV Index:   38 LVOT Area:     3.14 cm  RIGHT VENTRICLE RV S prime:     9.90 cm/s TAPSE (M-mode): 1.6 cm LEFT ATRIUM             Index       RIGHT ATRIUM           Index LA diam:        3.70 cm 1.87 cm/m  RA Area:     14.00 cm LA Vol (A2C):   57.7 ml 29.17 ml/m RA Volume:   32.90 ml  16.63 ml/m LA Vol (A4C):   58.1 ml 29.37 ml/m LA Biplane Vol: 58.7 ml 29.67 ml/m  AORTIC VALVE LVOT Vmax:   103.00 cm/s LVOT Vmean:  66.600 cm/s LVOT VTI:    0.238 m  AORTA Ao Root diam: 3.20 cm MITRAL VALVE MV Area (PHT): 3.06 cm     SHUNTS MV Decel Time: 248 msec     Systemic VTI:  0.24 m MV E velocity: 102.00 cm/s  Systemic Diam: 2.00 cm MV A velocity: 115.00 cm/s MV E/A ratio:  0.89 Armanda Magic MD Electronically signed by Armanda Magic MD Signature Date/Time: 05/24/2020/11:10:22 AM    Final     Patient Profile     69 y.o. female admitted with non-ST elevation myocardial infarction.  Echocardiogram shows normal LV function and no significant valve regurgitation.  Assessment & Plan    1 non-ST elevation myocardial infarction-patient remains pain-free.  Plan is for cardiac catheterization tomorrow.  The risk and benefits previously discussed including myocardial infarction, CVA and death and she agrees to proceed.  Echocardiogram shows normal LV function.  Continue aspirin, heparin, statin and beta-blocker.  2 hyperlipidemia-Crestor increased to 40 mg daily at time of admission.  Will need lipids and liver in 12 weeks.  3  hypertension-blood pressure controlled.  Continue present medications and follow.  4 paroxysmal atrial fibrillation-patient developed atrial fibrillation overnight.  However she is asymptomatic.  Her heart rate is controlled; I will discontinue Cardizem and carvedilol and instead treat with Lopressor 25 mg twice daily.  Follow heart rate and advance as needed.  Check TSH.  Echocardiogram shows normal LV function. CHADSvasc 7.  She will need apixaban once all procedures complete.  5 diabetes mellitus-management per primary care.  For questions or updates, please contact [facility] Please consult [email or web address] for contact info under        Signed, Olga Millers, MD  05/25/2020, 7:56 AM

## 2020-05-25 NOTE — Progress Notes (Signed)
ANTICOAGULATION CONSULT NOTE  Pharmacy Consult for heparin Indication: chest pain/ACS  Patient Measurements: Height: 5\' 5"  (165.1 cm) Weight: 90.9 kg (200 lb 5.4 oz) IBW/kg (Calculated) : 57 Heparin Dosing Weight: 77.1 kg  Vital Signs: Temp: 98.5 F (36.9 C) (10/03 1100) Temp Source: Oral (10/03 1100) BP: 94/64 (10/03 1100) Pulse Rate: 63 (10/03 1100)  Labs: Recent Labs    05/23/20 2051 05/23/20 2051 05/23/20 2221 05/24/20 0330 05/24/20 0835 05/25/20 0238 05/25/20 1040  HGB 12.7  --   --   --   --  13.9  --   HCT 38.8  --   --   --   --  43.9  --   PLT 354  --   --   --   --  348  --   HEPARINUNFRC  --   --   --   --  0.29* 0.22* 0.29*  CREATININE 1.04*  --   --   --   --   --   --   TROPONINIHS 36*   < > 119* 3,786* 6,628*  --   --    < > = values in this interval not displayed.   Estimated Creatinine Clearance: 58.5 mL/min (A) (by C-G formula based on SCr of 1.04 mg/dL (H)).  Medical History: Past Medical History:  Diagnosis Date  . Depression   . Diabetes mellitus without complication (HCC)   . Hyperlipidemia 10/19/2018  . Hypertension   . Stroke (HCC) 10/19/2018   Medications:  Infusions:  . heparin 1,300 Units/hr (05/25/20 0418)   Assessment: 68yo female c/o CP radiating to LUE and associated w/ mild SOB, troponin mildly elevated and increasing, to begin heparin. Transferred from [facility].   Heparin level slightly subtherapeutic at 0.29 after increasing heparin to 1300 units/hr this morning. No bleeding per RN. Left heart cath planned for Monday.   Goal of Therapy:  Heparin level 0.3-0.7 units/ml Monitor platelets by anticoagulation protocol: Yes   Plan:  Increase heparin IV to 1500 units/hr Recheck heparin level in 6 hours Continue to monitor for s/sx bleeding  Sunday, PharmD PGY1 Pharmacy Resident 05/25/2020 11:55 AM  Please check [email or web address] for unit-specific pharmacy phone numbers.

## 2020-05-26 ENCOUNTER — Inpatient Hospital Stay (HOSPITAL_COMMUNITY)
Admission: EM | Disposition: A | Discharge status: Home / Self Care | Payer: Self-pay | Source: Home / Self Care | Attending: Internal Medicine

## 2020-05-26 ENCOUNTER — Other Ambulatory Visit: Payer: Self-pay | Admitting: Student

## 2020-05-26 ENCOUNTER — Encounter: Payer: Self-pay | Admitting: *Deleted

## 2020-05-26 ENCOUNTER — Encounter (HOSPITAL_COMMUNITY): Payer: Self-pay | Admitting: Family Medicine

## 2020-05-26 DIAGNOSIS — I480 Paroxysmal atrial fibrillation: Secondary | ICD-10-CM

## 2020-05-26 DIAGNOSIS — Z006 Encounter for examination for normal comparison and control in clinical research program: Secondary | ICD-10-CM

## 2020-05-26 DIAGNOSIS — N181 Chronic kidney disease, stage 1: Secondary | ICD-10-CM

## 2020-05-26 DIAGNOSIS — I2510 Atherosclerotic heart disease of native coronary artery without angina pectoris: Secondary | ICD-10-CM

## 2020-05-26 DIAGNOSIS — E782 Mixed hyperlipidemia: Secondary | ICD-10-CM

## 2020-05-26 DIAGNOSIS — I4891 Unspecified atrial fibrillation: Secondary | ICD-10-CM

## 2020-05-26 DIAGNOSIS — E1122 Type 2 diabetes mellitus with diabetic chronic kidney disease: Secondary | ICD-10-CM

## 2020-05-26 HISTORY — PX: LEFT HEART CATH AND CORONARY ANGIOGRAPHY: CATH118249

## 2020-05-26 HISTORY — PX: CORONARY PRESSURE/FFR STUDY: CATH118243

## 2020-05-26 LAB — COMPREHENSIVE METABOLIC PANEL
ALT: 15 U/L (ref 0–44)
AST: 16 U/L (ref 15–41)
Albumin: 3.5 g/dL (ref 3.5–5.0)
Alkaline Phosphatase: 43 U/L (ref 38–126)
Anion gap: 7 (ref 5–15)
BUN: 25 mg/dL — ABNORMAL HIGH (ref 8–23)
CO2: 30 mmol/L (ref 22–32)
Calcium: 9 mg/dL (ref 8.9–10.3)
Chloride: 102 mmol/L (ref 98–111)
Creatinine, Ser: 1.18 mg/dL — ABNORMAL HIGH (ref 0.44–1.00)
GFR calc Af Amer: 55 mL/min — ABNORMAL LOW (ref >60)
GFR calc non Af Amer: 48 mL/min — ABNORMAL LOW (ref >60)
Glucose, Bld: 110 mg/dL — ABNORMAL HIGH (ref 70–99)
Potassium: 3.9 mmol/L (ref 3.5–5.1)
Sodium: 139 mmol/L (ref 135–145)
Total Bilirubin: 0.8 mg/dL (ref 0.3–1.2)
Total Protein: 6.6 g/dL (ref 6.5–8.1)

## 2020-05-26 LAB — CBC
HCT: 41.2 % (ref 36.0–46.0)
Hemoglobin: 12.8 g/dL (ref 12.0–15.0)
MCH: 29.8 pg (ref 26.0–34.0)
MCHC: 31.1 g/dL (ref 30.0–36.0)
MCV: 95.8 fL (ref 80.0–100.0)
Platelets: 330 10*3/uL (ref 150–400)
RBC: 4.3 MIL/uL (ref 3.87–5.11)
RDW: 13.1 % (ref 11.5–15.5)
WBC: 11.4 10*3/uL — ABNORMAL HIGH (ref 4.0–10.5)
nRBC: 0 % (ref 0.0–0.2)

## 2020-05-26 LAB — GLUCOSE, CAPILLARY
Glucose-Capillary: 117 mg/dL — ABNORMAL HIGH (ref 70–99)
Glucose-Capillary: 119 mg/dL — ABNORMAL HIGH (ref 70–99)

## 2020-05-26 LAB — HEPARIN LEVEL (UNFRACTIONATED): Heparin Unfractionated: 0.62 IU/mL (ref 0.30–0.70)

## 2020-05-26 LAB — MAGNESIUM: Magnesium: 2 mg/dL (ref 1.7–2.4)

## 2020-05-26 LAB — POCT ACTIVATED CLOTTING TIME: Activated Clotting Time: 439 seconds

## 2020-05-26 LAB — TSH: TSH: 9.92 u[IU]/mL — ABNORMAL HIGH (ref 0.350–4.500)

## 2020-05-26 SURGERY — LEFT HEART CATH AND CORONARY ANGIOGRAPHY
Anesthesia: LOCAL

## 2020-05-26 MED ORDER — VERAPAMIL HCL 2.5 MG/ML IV SOLN
INTRAVENOUS | Status: DC | PRN
  Administered 2020-05-26: 10 mL via INTRA_ARTERIAL

## 2020-05-26 MED ORDER — APIXABAN 5 MG PO TABS: 5.0000 mg | ORAL_TABLET | Freq: Two times a day (BID) | ORAL | Status: DC

## 2020-05-26 MED ORDER — IOHEXOL 350 MG/ML SOLN
INTRAVENOUS | Status: DC | PRN
  Administered 2020-05-26: 95 mL

## 2020-05-26 MED ORDER — FENTANYL CITRATE (PF) 100 MCG/2ML IJ SOLN
INTRAMUSCULAR | Status: DC | PRN
Start: 2020-05-26 — End: 2020-05-26
  Administered 2020-05-26: 25 ug via INTRAVENOUS

## 2020-05-26 MED ORDER — LIDOCAINE HCL (PF) 1 % IJ SOLN
INTRAMUSCULAR | Status: DC | PRN
  Administered 2020-05-26: 2 mL

## 2020-05-26 MED ORDER — SODIUM CHLORIDE 0.9 % WEIGHT BASED INFUSION: 1.0000 mL/kg/h | INTRAVENOUS | Status: AC

## 2020-05-26 MED ORDER — HEPARIN (PORCINE) IN NACL 1000-0.9 UT/500ML-% IV SOLN
INTRAVENOUS | Status: DC | PRN
  Administered 2020-05-26 (×2): 500 mL

## 2020-05-26 MED ORDER — OFF THE BEAT BOOK
Freq: Once | Status: AC
  Filled 2020-05-26: qty 1

## 2020-05-26 MED ORDER — HEPARIN SODIUM (PORCINE) 1000 UNIT/ML IJ SOLN
INTRAMUSCULAR | Status: DC | PRN
  Administered 2020-05-26 (×2): 4500 [IU] via INTRAVENOUS

## 2020-05-26 MED ORDER — ASPIRIN 81 MG PO TBEC
81.0000 mg | DELAYED_RELEASE_TABLET | Freq: Every day | ORAL | 1 refills | Status: DC
Start: 2020-05-27 — End: 2020-06-16

## 2020-05-26 MED ORDER — SODIUM CHLORIDE 0.9% FLUSH: 3.0000 mL | INTRAVENOUS | Status: DC | PRN

## 2020-05-26 MED ORDER — SODIUM CHLORIDE 0.9 % IV SOLN: 250.0000 mL | INTRAVENOUS | Status: DC | PRN

## 2020-05-26 MED ORDER — ROSUVASTATIN CALCIUM 40 MG PO TABS: 40.0000 mg | ORAL_TABLET | Freq: Every day | ORAL | 1 refills | Status: AC

## 2020-05-26 MED ORDER — LIDOCAINE HCL (PF) 1 % IJ SOLN
INTRAMUSCULAR | Status: AC
  Filled 2020-05-26: qty 30

## 2020-05-26 MED ORDER — HEPARIN (PORCINE) IN NACL 1000-0.9 UT/500ML-% IV SOLN
INTRAVENOUS | Status: AC
  Filled 2020-05-26: qty 1000

## 2020-05-26 MED ORDER — ENOXAPARIN SODIUM 40 MG/0.4ML ~~LOC~~ SOLN: 40.0000 mg | SUBCUTANEOUS | Status: DC

## 2020-05-26 MED ORDER — APIXABAN 5 MG PO TABS: 5.0000 mg | ORAL_TABLET | Freq: Two times a day (BID) | ORAL | 3 refills | Status: DC

## 2020-05-26 MED ORDER — ADENOSINE (DIAGNOSTIC) 140MCG/KG/MIN
INTRAVENOUS | Status: DC | PRN
  Administered 2020-05-26: 140 ug/kg/min via INTRAVENOUS

## 2020-05-26 MED ORDER — NITROGLYCERIN 1 MG/10 ML FOR IR/CATH LAB
INTRA_ARTERIAL | Status: AC
  Filled 2020-05-26: qty 10

## 2020-05-26 MED ORDER — MIDAZOLAM HCL 2 MG/2ML IJ SOLN
INTRAMUSCULAR | Status: AC
  Filled 2020-05-26: qty 2

## 2020-05-26 MED ORDER — MIDAZOLAM HCL 2 MG/2ML IJ SOLN
INTRAMUSCULAR | Status: DC | PRN
  Administered 2020-05-26: 1 mg via INTRAVENOUS

## 2020-05-26 MED ORDER — VERAPAMIL HCL 2.5 MG/ML IV SOLN
INTRAVENOUS | Status: AC
  Filled 2020-05-26: qty 2

## 2020-05-26 MED ORDER — HEPARIN SODIUM (PORCINE) 1000 UNIT/ML IJ SOLN
INTRAMUSCULAR | Status: AC
  Filled 2020-05-26: qty 1

## 2020-05-26 MED ORDER — METOPROLOL TARTRATE 25 MG PO TABS: 25.0000 mg | ORAL_TABLET | Freq: Two times a day (BID) | ORAL | 1 refills | Status: AC

## 2020-05-26 MED ORDER — SODIUM CHLORIDE 0.9% FLUSH: 3.0000 mL | Freq: Two times a day (BID) | INTRAVENOUS | Status: DC

## 2020-05-26 MED ORDER — HEART ATTACK BOUNCING BOOK
Freq: Once | Status: AC
  Filled 2020-05-26: qty 1

## 2020-05-26 MED ORDER — FENTANYL CITRATE (PF) 100 MCG/2ML IJ SOLN
INTRAMUSCULAR | Status: AC
  Filled 2020-05-26: qty 2

## 2020-05-26 MED ORDER — ADENOSINE 12 MG/4ML IV SOLN
INTRAVENOUS | Status: AC
  Filled 2020-05-26: qty 16

## 2020-05-26 SURGICAL SUPPLY — 12 items
CATH 5FR JL3.5 JR4 ANG PIG MP (CATHETERS) ×1 IMPLANT
CATH VISTA GUIDE 6FR XBLAD3.5 (CATHETERS) ×1 IMPLANT
DEVICE RAD COMP TR BAND LRG (VASCULAR PRODUCTS) ×1 IMPLANT
GLIDESHEATH SLEND SS 6F .021 (SHEATH) ×1 IMPLANT
GUIDEWIRE INQWIRE 1.5J.035X260 (WIRE) IMPLANT
GUIDEWIRE PRESSURE COMET II (WIRE) ×1 IMPLANT
INQWIRE 1.5J .035X260CM (WIRE) ×2
KIT ESSENTIALS PG (KITS) ×1 IMPLANT
KIT HEART LEFT (KITS) ×2 IMPLANT
PACK CARDIAC CATHETERIZATION (CUSTOM PROCEDURE TRAY) ×2 IMPLANT
TRANSDUCER W/STOPCOCK (MISCELLANEOUS) ×2 IMPLANT
TUBING CIL FLEX 10 FLL-RA (TUBING) ×2 IMPLANT

## 2020-05-26 NOTE — Discharge Summary (Signed)
Physician Discharge Summary  [name] XOV:[id number] DOB: July 18, 1951 DOA: 05/23/2020  PCP: Gwenlyn Found, MD  Admit date: 05/23/2020 Discharge date: 05/26/2020  Admitted From: Home Disposition:  Home  Discharge Condition:Stable CODE STATUS:FULL Diet recommendation: Heart Healthy  Brief/Interim Summary: Patient is a 69 year old with history of hypothyroidism, HLD, HTN, DM2 admitted for chest pain.  Noted to have elevated troponin started on IV heparin.  Initially presented to [facility] transferred to [facility] for further evaluation.  EKG was overall unremarkable.  Cardiology recommended left heart catheterization which showed nonobstructive coronary disease.  She was also found to have paroxysmal A. fib for which she has been started on Eliquis.  She is hemodynamically stable for discharge later today.  Following problems were addressed during her hospitalization:  Non-ST elevation MI -Troponins initially went up to greater than 6000.  No acute EKG changes -Echocardiogram overall unremarkable with EF of 60% -Currently on Lopressor, aspirin, statin -Underwent  left heart catheterization which showed nonobstructive coronary artery disease -She has been recommended to continue aspirin, metoprolol, dose of Crestor increased. -She will follow up with cardiology as an outpatient  Paroxysmal A. Fib: -Currently in normal sinus rhythm -Started on anticoagulation with Eliquis.  On metoprolol for rate control -Mildly bradycardic but asymptomatic.  Discussed with cardiology and decided to be continued on metoprolol 25 mg twice daily  Essential hypertension -Lopressor 25 mg twice daily  Hyperlipidemia -Crestor increased to 40 mg at bedtime.  Will require repeat lipid panel in about 2-3 months  Diabetes mellitus type 2 -.  Not on any home meds.  Hemoglobin A1c of 6.3.  We recommend dietary and lifestyle changes  Hypothyroidism -Continue Synthroid 100 mcg daily.       Discharge Diagnoses:  Principal Problem:   NSTEMI (non-ST elevated myocardial infarction) (HCC) Active Problems:   Atypical chest pain   Diabetes type 2, controlled (HCC)   Hypertension   Hyperlipidemia    Discharge Instructions  Discharge Instructions    Diet - low sodium heart healthy   Complete by: As directed    Discharge instructions   Complete by: As directed    1)Please follow-up with cardiology as an outpatient.  You have an appointment on 06/16/2020 at 1:40 pm 2)take prescribed medications as instructed 3)Follow up with your PCP in a week.   Increase activity slowly   Complete by: As directed      Allergies as of 05/26/2020      Reactions   Tape Rash   Can only tolerate LIMITED exposure; no "plastic tape," please      Medication List    TAKE these medications   ALPRAZolam 0.5 MG tablet Commonly known as: XANAX Take 0.5 mg by mouth daily as needed for anxiety.   apixaban 5 MG Tabs tablet Commonly known as: ELIQUIS Take 1 tablet (5 mg total) by mouth 2 (two) times daily.   aspirin 81 MG EC tablet Take 1 tablet (81 mg total) by mouth daily. Swallow whole. Start taking on: May 27, 2020 What changed:   additional instructions  Another medication with the same name was removed. Continue taking this medication, and follow the directions you see here.   citalopram 20 MG tablet Commonly known as: CELEXA Take 20 mg by mouth daily.   metoprolol tartrate 25 MG tablet Commonly known as: LOPRESSOR Take 1 tablet (25 mg total) by mouth 2 (two) times daily.   rosuvastatin 40 MG tablet Commonly known as: CRESTOR Take 1 tablet (40 mg total)  by mouth at bedtime. What changed:   medication strength  how much to take   Synthroid 100 MCG tablet Generic drug: levothyroxine Take 100 mcg by mouth daily before breakfast.       Follow-up Information    Lewayne Bunting, MD Follow up.   Specialty: Cardiology Why: Follow-up visit scheduled for  06/16/2020 at 1:40pm. Please arrive 15 minutes early for check-in. If this date/time does not work for you, please call our office to reschedule. Our office will also call you to arrange heart monitor. Contact information: [address] [place] [address] [phone]              Allergies  Allergen Reactions  . Tape Rash    Can only tolerate LIMITED exposure; no "plastic tape," please    Consultations:  Cardiology   Procedures/Studies: CARDIAC CATHETERIZATION  Result Date: 05/26/2020  Mid LAD lesion is 65% stenosed.  LV end diastolic pressure is mildly elevated.  1. Borderline focal stenosis in the mid LAD. FFR and iFR are normal indicating that the lesion is not hemodynamically significant. 2. Mildly elevated LVEDP Plan: medical management.   DG Chest Portable 1 View  Result Date: 05/23/2020 CLINICAL DATA:  Chest pain EXAM: PORTABLE CHEST 1 VIEW COMPARISON:  October 19, 2018 FINDINGS: The heart size and mediastinal contours are within normal limits. There is mild prominence of the central pulmonary vasculature. No large airspace consolidation or pleural effusion. No acute osseous abnormality. IMPRESSION: Mild pulmonary vascular congestion. Electronically Signed   By: [name] M.D.   On: 05/23/2020 23:42   ECHOCARDIOGRAM COMPLETE  Result Date: 05/24/2020    ECHOCARDIOGRAM REPORT   Patient Name:   [name] Date of Exam: 05/24/2020 Medical Rec #:  [id number]      Height:       65.0 in Accession #:    [id number]     Weight:       200.0 lb Date of Birth:  [birth year]     BSA:          1.978 m Patient Age:    68 years       BP:           124/65 mmHg Patient Gender: F              HR:           67 bpm. [facility] Procedure: 2D Echo, Cardiac Doppler and Color Doppler Indications:    Chest Pain 786.50 / R07.9  History:        Patient has prior history of Echocardiogram examinations, most                 recent 10/19/2018. Previous Myocardial  Infarction, TIA; Risk                 Factors:Hypertension, Diabetes and Dyslipidemia.  Sonographer:    Celesta Gentile RCS Referring Phys: [id number] ASIA B ZIERLE-GHOSH IMPRESSIONS  1. Left ventricular ejection fraction, by estimation, is 60 to 65%. The left ventricle has normal function. Left ventricular endocardial border not optimally defined to evaluate regional wall motion. Left ventricular diastolic parameters were normal.  2. Right ventricular systolic function is normal. The right ventricular size is normal. Tricuspid regurgitation signal is inadequate for assessing PA pressure.  3. The mitral valve is normal in structure. No evidence of mitral valve regurgitation. No evidence of mitral stenosis.  4. The aortic valve is tricuspid. Aortic valve regurgitation is  not visualized. Mild aortic valve sclerosis is present, with no evidence of aortic valve stenosis.  5. The inferior vena cava is normal in size with greater than 50% respiratory variability, suggesting right atrial pressure of 3 mmHg.  6. Recommend limited study with definity to delineate endocardial borders for assessment of wall motion. FINDINGS  Left Ventricle: Left ventricular ejection fraction, by estimation, is 60 to 65%. The left ventricle has normal function. Left ventricular endocardial border not optimally defined to evaluate regional wall motion. The left ventricular internal cavity size was normal in size. There is no left ventricular hypertrophy. Left ventricular diastolic parameters were normal. Normal left ventricular filling pressure. Right Ventricle: The right ventricular size is normal. No increase in right ventricular wall thickness. Right ventricular systolic function is normal. Tricuspid regurgitation signal is inadequate for assessing PA pressure. Left Atrium: Left atrial size was normal in size. Right Atrium: Right atrial size was normal in size. Pericardium: There is no evidence of pericardial effusion. Mitral Valve: The mitral  valve is normal in structure. No evidence of mitral valve regurgitation. No evidence of mitral valve stenosis. Tricuspid Valve: The tricuspid valve is normal in structure. Tricuspid valve regurgitation is trivial. No evidence of tricuspid stenosis. Aortic Valve: The aortic valve is tricuspid. Aortic valve regurgitation is not visualized. Mild aortic valve sclerosis is present, with no evidence of aortic valve stenosis. Pulmonic Valve: The pulmonic valve was normal in structure. Pulmonic valve regurgitation is trivial. No evidence of pulmonic stenosis. Aorta: The aortic root is normal in size and structure. Venous: The inferior vena cava is normal in size with greater than 50% respiratory variability, suggesting right atrial pressure of 3 mmHg. IAS/Shunts: The interatrial septum appears to be lipomatous. No atrial level shunt detected by color flow Doppler.  LEFT VENTRICLE PLAX 2D LVIDd:         4.35 cm  Diastology LVIDs:         2.79 cm  LV e' medial:    9.03 cm/s LV PW:         1.30 cm  LV E/e' medial:  11.3 LV IVS:        0.88 cm  LV e' lateral:   9.57 cm/s LVOT diam:     2.00 cm  LV E/e' lateral: 10.7 LV SV:         75 LV SV Index:   38 LVOT Area:     3.14 cm  RIGHT VENTRICLE RV S prime:     9.90 cm/s TAPSE (M-mode): 1.6 cm LEFT ATRIUM             Index       RIGHT ATRIUM           Index LA diam:        3.70 cm 1.87 cm/m  RA Area:     14.00 cm LA Vol (A2C):   57.7 ml 29.17 ml/m RA Volume:   32.90 ml  16.63 ml/m LA Vol (A4C):   58.1 ml 29.37 ml/m LA Biplane Vol: 58.7 ml 29.67 ml/m  AORTIC VALVE LVOT Vmax:   103.00 cm/s LVOT Vmean:  66.600 cm/s LVOT VTI:    0.238 m  AORTA Ao Root diam: 3.20 cm MITRAL VALVE MV Area (PHT): 3.06 cm     SHUNTS MV Decel Time: 248 msec     Systemic VTI:  0.24 m MV E velocity: 102.00 cm/s  Systemic Diam: 2.00 cm MV A velocity: 115.00 cm/s MV E/A ratio:  0.89 Armanda Magic MD  Electronically signed by Armanda Magic MD Signature Date/Time: 05/24/2020/11:10:22 AM    Final         Subjective: Patient seen and examined at the bedside this morning.  Hemodynamically stable for discharge.  Discharge Exam: Vitals:   05/26/20 1105 05/26/20 1123  BP: (!) 96/56 126/62  Pulse: (!) 50 (!) 52  Resp: 11 19  Temp:  98 F (36.7 C)  SpO2: 95% 95%   Vitals:   05/26/20 1036 05/26/20 1051 05/26/20 1105 05/26/20 1123  BP: (!) 115/58 (!) 93/48 (!) 96/56 126/62  Pulse: (!) 50 (!) 50 (!) 50 (!) 52  Resp: 11 14 11 19   Temp:    98 F (36.7 C)  TempSrc:    Oral  SpO2: 94% 98% 95% 95%  Weight:      Height:        General: Pt is alert, awake, not in acute distress Cardiovascular: RRR, S1/S2 +, no rubs, no gallops Respiratory: CTA bilaterally, no wheezing, no rhonchi Abdominal: Soft, NT, ND, bowel sounds + Extremities: no edema, no cyanosis    The results of significant diagnostics from this hospitalization (including imaging, microbiology, ancillary and laboratory) are listed below for reference.     Microbiology: Recent Results (from the past 240 hour(s))  Respiratory Panel by RT PCR (Flu A&B, Covid) - Nasopharyngeal Swab     Status: None   Collection Time: 05/24/20 12:48 AM   Specimen: Nasopharyngeal Swab  Result Value Ref Range Status   SARS Coronavirus 2 by RT PCR NEGATIVE NEGATIVE Final    Comment: (NOTE) SARS-CoV-2 target nucleic acids are NOT DETECTED.  The SARS-CoV-2 RNA is generally detectable in upper respiratoy specimens during the acute phase of infection. The lowest concentration of SARS-CoV-2 viral copies this assay can detect is 131 copies/mL. A negative result does not preclude SARS-Cov-2 infection and should not be used as the sole basis for treatment or other patient management decisions. A negative result may occur with  improper specimen collection/handling, submission of specimen other than nasopharyngeal swab, presence of viral mutation(s) within the areas targeted by this assay, and inadequate number of viral copies (<131  copies/mL). A negative result must be combined with clinical observations, patient history, and epidemiological information. The expected result is Negative.  Fact Sheet for Patients:  07/24/20  Fact Sheet for Healthcare Providers:  https://www.moore.com/  This test is no t yet approved or cleared by the https://www.young.biz/ FDA and  has been authorized for detection and/or diagnosis of SARS-CoV-2 by FDA under an Emergency Use Authorization (EUA). This EUA will remain  in effect (meaning this test can be used) for the duration of the COVID-19 declaration under Section 564(b)(1) of the Act, 21 U.S.C. section 360bbb-3(b)(1), unless the authorization is terminated or revoked sooner.     Influenza A by PCR NEGATIVE NEGATIVE Final   Influenza B by PCR NEGATIVE NEGATIVE Final    Comment: (NOTE) The Xpert Xpress SARS-CoV-2/FLU/RSV assay is intended as an aid in  the diagnosis of influenza from Nasopharyngeal swab specimens and  should not be used as a sole basis for treatment. Nasal washings and  aspirates are unacceptable for Xpert Xpress SARS-CoV-2/FLU/RSV  testing.  Fact Sheet for Patients: [place]  Fact Sheet for Healthcare Providers: https://www.moore.com/  This test is not yet approved or cleared by the https://www.young.biz/ FDA and  has been authorized for detection and/or diagnosis of SARS-CoV-2 by  FDA under an Emergency Use Authorization (EUA). This EUA will remain  in effect (meaning this  test can be used) for the duration of the  Covid-19 declaration under Section 564(b)(1) of the Act, 21  U.S.C. section 360bbb-3(b)(1), unless the authorization is  terminated or revoked. Performed at [facility], [address] [place], [place] [id number]      Labs: BNP (last 3 results) No results for input(s): BNP in the last 8760 hours. Basic Metabolic Panel: Recent Labs  Lab  05/23/20 2051 05/26/20 0306  NA 138 139  K 4.0 3.9  CL 104 102  CO2 26 30  GLUCOSE 161* 110*  BUN 24* 25*  CREATININE 1.04* 1.18*  CALCIUM 8.6* 9.0  MG  --  2.0   Liver Function Tests: Recent Labs  Lab 05/26/20 0306  AST 16  ALT 15  ALKPHOS 43  BILITOT 0.8  PROT 6.6  ALBUMIN 3.5   No results for input(s): LIPASE, AMYLASE in the last 168 hours. No results for input(s): AMMONIA in the last 168 hours. CBC: Recent Labs  Lab 05/23/20 2051 05/25/20 0238 05/26/20 0306  WBC 10.0 10.0 11.4*  NEUTROABS 7.0  --   --   HGB 12.7 13.9 12.8  HCT 38.8 43.9 41.2  MCV 95.1 93.6 95.8  PLT 354 348 330   Cardiac Enzymes: No results for input(s): CKTOTAL, CKMB, CKMBINDEX, TROPONINI in the last 168 hours. BNP: Invalid input(s): POCBNP CBG: Recent Labs  Lab 05/25/20 1145 05/25/20 1718 05/25/20 2048 05/26/20 0741 05/26/20 1125  GLUCAP 112* 108* 158* 117* 119*   D-Dimer No results for input(s): DDIMER in the last 72 hours. Hgb A1c Recent Labs    05/25/20 0238  HGBA1C 6.3*   Lipid Profile Recent Labs    05/24/20 0343  CHOL 115  HDL 39*  LDLCALC 56  TRIG 604102  CHOLHDL 2.9   Thyroid function studies Recent Labs    05/26/20 0309  TSH 9.920*   Anemia work up No results for input(s): VITAMINB12, FOLATE, FERRITIN, TIBC, IRON, RETICCTPCT in the last 72 hours. Urinalysis    Component Value Date/Time   COLORURINE YELLOW 10/18/2018 2326   APPEARANCEUR HAZY (A) 10/18/2018 2326   LABSPEC 1.013 10/18/2018 2326   PHURINE 5.0 10/18/2018 2326   GLUCOSEU NEGATIVE 10/18/2018 2326   HGBUR SMALL (A) 10/18/2018 2326   BILIRUBINUR NEGATIVE 10/18/2018 2326   KETONESUR NEGATIVE 10/18/2018 2326   PROTEINUR NEGATIVE 10/18/2018 2326   UROBILINOGEN 0.2 11/05/2010 1716   NITRITE NEGATIVE 10/18/2018 2326   LEUKOCYTESUR LARGE (A) 10/18/2018 2326   Sepsis Labs Invalid input(s): PROCALCITONIN,  WBC,  LACTICIDVEN Microbiology Recent Results (from the past 240 hour(s))   Respiratory Panel by RT PCR (Flu A&B, Covid) - Nasopharyngeal Swab     Status: None   Collection Time: 05/24/20 12:48 AM   Specimen: Nasopharyngeal Swab  Result Value Ref Range Status   SARS Coronavirus 2 by RT PCR NEGATIVE NEGATIVE Final    Comment: (NOTE) SARS-CoV-2 target nucleic acids are NOT DETECTED.  The SARS-CoV-2 RNA is generally detectable in upper respiratoy specimens during the acute phase of infection. The lowest concentration of SARS-CoV-2 viral copies this assay can detect is 131 copies/mL. A negative result does not preclude SARS-Cov-2 infection and should not be used as the sole basis for treatment or other patient management decisions. A negative result may occur with  improper specimen collection/handling, submission of specimen other than nasopharyngeal swab, presence of viral mutation(s) within the areas targeted by this assay, and inadequate number of viral copies (<131 copies/mL). A negative result must be combined with clinical observations, patient  history, and epidemiological information. The expected result is Negative.  Fact Sheet for Patients:  https://www.moore.com/  Fact Sheet for Healthcare Providers:  https://www.young.biz/  This test is no t yet approved or cleared by the [place] FDA and  has been authorized for detection and/or diagnosis of SARS-CoV-2 by FDA under an Emergency Use Authorization (EUA). This EUA will remain  in effect (meaning this test can be used) for the duration of the COVID-19 declaration under Section 564(b)(1) of the Act, 21 U.S.C. section 360bbb-3(b)(1), unless the authorization is terminated or revoked sooner.     Influenza A by PCR NEGATIVE NEGATIVE Final   Influenza B by PCR NEGATIVE NEGATIVE Final    Comment: (NOTE) The Xpert Xpress SARS-CoV-2/FLU/RSV assay is intended as an aid in  the diagnosis of influenza from Nasopharyngeal swab specimens and  should not be used as  a sole basis for treatment. Nasal washings and  aspirates are unacceptable for Xpert Xpress SARS-CoV-2/FLU/RSV  testing.  Fact Sheet for Patients: https://www.moore.com/  Fact Sheet for Healthcare Providers: https://www.young.biz/  This test is not yet approved or cleared by the [place] FDA and  has been authorized for detection and/or diagnosis of SARS-CoV-2 by  FDA under an Emergency Use Authorization (EUA). This EUA will remain  in effect (meaning this test can be used) for the duration of the  Covid-19 declaration under Section 564(b)(1) of the Act, 21  U.S.C. section 360bbb-3(b)(1), unless the authorization is  terminated or revoked. Performed at [facility], [address] [place], [place] [address]     Please note: You were cared for by a hospitalist during your hospital stay. Once you are discharged, your primary care physician will handle any further medical issues. Please note that NO REFILLS for any discharge medications will be authorized once you are discharged, as it is imperative that you return to your primary care physician (or establish a relationship with a primary care physician if you do not have one) for your post hospital discharge needs so that they can reassess your need for medications and monitor your lab values.    Time coordinating discharge: 40 minutes  SIGNED:   Burnadette Pop, MD  [facility] 05/26/2020, 11:56 AM Pager [phone]  If 7PM-7AM, please contact night-coverage [email or web address] Password [email or web address]

## 2020-05-26 NOTE — Care Management (Signed)
Case Manager submitted request to CMA for benefit check for copay amount for:  Eliquis 5mg  BID, Xarelto 20mg  one per day, Farxiga 10mg  one per day. TOC Team will continue to monitor. , RN BSN Case Manager [phone]

## 2020-05-26 NOTE — Progress Notes (Addendum)
Progress Note  Patient Name: [name] Date of Encounter: 05/26/2020  [facility] Cardiologist: Olga Millers, MD (New)  Subjective   No acute overnight events.  No further chest pain or pressure. Had cardiac cath this morning -feels little groggy, but otherwise okay.  -> She has noted some exertional dyspnea and occasional chest discomfort over the last several months, most notably with walking up a hill.  No prolonged episodes.  Inpatient Medications    Scheduled Meds: . aspirin EC  81 mg Oral Daily  . citalopram  20 mg Oral Daily  . levothyroxine  100 mcg Oral QAC breakfast  . metoprolol tartrate  25 mg Oral BID  . rosuvastatin  40 mg Oral QHS  . sodium chloride flush  3 mL Intravenous Q12H   Continuous Infusions: . sodium chloride    . sodium chloride    . heparin 1,500 Units/hr (05/25/20 1453)   PRN Meds: sodium chloride, acetaminophen, ALPRAZolam, ondansetron (ZOFRAN) IV, oxyCODONE, senna-docusate, sodium chloride flush   Vital Signs    Vitals:   05/25/20 2146 05/25/20 2155 05/25/20 2258 05/26/20 0531  BP:   118/61 116/60  Pulse:  76 62 63  Resp:   17 15  Temp:   98.1 F (36.7 C) 97.9 F (36.6 C)  TempSrc:   Oral Oral  SpO2:   97% 97%  Weight: 90.9 kg   93.4 kg  Height:        Intake/Output Summary (Last 24 hours) at 05/26/2020 0625 Last data filed at 05/26/2020 0525 Gross per 24 hour  Intake 579.15 ml  Output 300 ml  Net 279.15 ml   Last 3 Weights 05/26/2020 05/25/2020 05/25/2020  Weight (lbs) 205 lb 14.4 oz 200 lb 6.4 oz 200 lb 5.4 oz  Weight (kg) 93.396 kg 90.9 kg 90.872 kg      Telemetry    Sinus rhythm with baseline rates in the 50's to 60's. - Personally Reviewed  ECG    No new ECG tracing today. - Personally Reviewed  Physical Exam   Patient seen post cath  GEN: No acute distress.   Neck: No JVD or bruit. Cardiac: RRR, normal S1-S2, 1/6 SEM at RUSB.  Otherwise no rubs, or gallops.  Respiratory: Clear to auscultation  bilaterally.  Nonlabored, good air movement GI: Soft, nontender, non-distended ; NABS MS: No edema; No deformity.  Right radial cath site C/C/I.  TR band in place.  No bleeding. Neuro:  Nonfocal  Psych: Normal affect   Labs    High Sensitivity Troponin:   Recent Labs  Lab 05/23/20 2051 05/23/20 2221 05/24/20 0330 05/24/20 0835  TROPONINIHS 36* 119* 3,786* 6,628*      Chemistry Recent Labs  Lab 05/23/20 2051 05/26/20 0306  NA 138 139  K 4.0 3.9  CL 104 102  CO2 26 30  GLUCOSE 161* 110*  BUN 24* 25*  CREATININE 1.04* 1.18*  CALCIUM 8.6* 9.0  PROT  --  6.6  ALBUMIN  --  3.5  AST  --  16  ALT  --  15  ALKPHOS  --  43  BILITOT  --  0.8  GFRNONAA 56* 48*  GFRAA >60 55*  ANIONGAP 8 7     Hematology Recent Labs  Lab 05/23/20 2051 05/25/20 0238 05/26/20 0306  WBC 10.0 10.0 11.4*  RBC 4.08 4.69 4.30  HGB 12.7 13.9 12.8  HCT 38.8 43.9 41.2  MCV 95.1 93.6 95.8  MCH 31.1 29.6 29.8  MCHC 32.7 31.7 31.1  RDW 13.0 12.9 13.1  PLT 354 348 330    BNPNo results for input(s): BNP, PROBNP in the last 168 hours.   DDimer No results for input(s): DDIMER in the last 168 hours.   Radiology    ECHOCARDIOGRAM COMPLETE  Result Date: 05/24/2020    ECHOCARDIOGRAM REPORT   Patient Name:   [name] Date of Exam: 05/24/2020 Medical Rec #:  [id number]      Height:       65.0 in Accession #:    [id number]     Weight:       200.0 lb Date of Birth:  [birth year]     BSA:          1.978 m Patient Age:    68 years       BP:           124/65 mmHg Patient Gender: F              HR:           67 bpm. [facility] Procedure: 2D Echo, Cardiac Doppler and Color Doppler Indications:    Chest Pain 786.50 / R07.9  History:        Patient has prior history of Echocardiogram examinations, most                 recent 10/19/2018. Previous Myocardial Infarction, TIA; Risk                 Factors:Hypertension, Diabetes and Dyslipidemia.  Sonographer:    Celesta GentileBernard White RCS Referring Phys:  [id number] ASIA B ZIERLE-GHOSH IMPRESSIONS  1. Left ventricular ejection fraction, by estimation, is 60 to 65%. The left ventricle has normal function. Left ventricular endocardial border not optimally defined to evaluate regional wall motion. Left ventricular diastolic parameters were normal.  2. Right ventricular systolic function is normal. The right ventricular size is normal. Tricuspid regurgitation signal is inadequate for assessing PA pressure.  3. The mitral valve is normal in structure. No evidence of mitral valve regurgitation. No evidence of mitral stenosis.  4. The aortic valve is tricuspid. Aortic valve regurgitation is not visualized. Mild aortic valve sclerosis is present, with no evidence of aortic valve stenosis.  5. The inferior vena cava is normal in size with greater than 50% respiratory variability, suggesting right atrial pressure of 3 mmHg.  6. Recommend limited study with definity to delineate endocardial borders for assessment of wall motion. FINDINGS  Left Ventricle: Left ventricular ejection fraction, by estimation, is 60 to 65%. The left ventricle has normal function. Left ventricular endocardial border not optimally defined to evaluate regional wall motion. The left ventricular internal cavity size was normal in size. There is no left ventricular hypertrophy. Left ventricular diastolic parameters were normal. Normal left ventricular filling pressure. Right Ventricle: The right ventricular size is normal. No increase in right ventricular wall thickness. Right ventricular systolic function is normal. Tricuspid regurgitation signal is inadequate for assessing PA pressure. Left Atrium: Left atrial size was normal in size. Right Atrium: Right atrial size was normal in size. Pericardium: There is no evidence of pericardial effusion. Mitral Valve: The mitral valve is normal in structure. No evidence of mitral valve regurgitation. No evidence of mitral valve stenosis. Tricuspid Valve: The tricuspid  valve is normal in structure. Tricuspid valve regurgitation is trivial. No evidence of tricuspid stenosis. Aortic Valve: The aortic valve is tricuspid. Aortic valve regurgitation is not visualized. Mild aortic valve sclerosis is present, with no evidence  of aortic valve stenosis. Pulmonic Valve: The pulmonic valve was normal in structure. Pulmonic valve regurgitation is trivial. No evidence of pulmonic stenosis. Aorta: The aortic root is normal in size and structure. Venous: The inferior vena cava is normal in size with greater than 50% respiratory variability, suggesting right atrial pressure of 3 mmHg. IAS/Shunts: The interatrial septum appears to be lipomatous. No atrial level shunt detected by color flow Doppler.  LEFT VENTRICLE PLAX 2D LVIDd:         4.35 cm  Diastology LVIDs:         2.79 cm  LV e' medial:    9.03 cm/s LV PW:         1.30 cm  LV E/e' medial:  11.3 LV IVS:        0.88 cm  LV e' lateral:   9.57 cm/s LVOT diam:     2.00 cm  LV E/e' lateral: 10.7 LV SV:         75 LV SV Index:   38 LVOT Area:     3.14 cm  RIGHT VENTRICLE RV S prime:     9.90 cm/s TAPSE (M-mode): 1.6 cm LEFT ATRIUM             Index       RIGHT ATRIUM           Index LA diam:        3.70 cm 1.87 cm/m  RA Area:     14.00 cm LA Vol (A2C):   57.7 ml 29.17 ml/m RA Volume:   32.90 ml  16.63 ml/m LA Vol (A4C):   58.1 ml 29.37 ml/m LA Biplane Vol: 58.7 ml 29.67 ml/m  AORTIC VALVE LVOT Vmax:   103.00 cm/s LVOT Vmean:  66.600 cm/s LVOT VTI:    0.238 m  AORTA Ao Root diam: 3.20 cm MITRAL VALVE MV Area (PHT): 3.06 cm     SHUNTS MV Decel Time: 248 msec     Systemic VTI:  0.24 m MV E velocity: 102.00 cm/s  Systemic Diam: 2.00 cm MV A velocity: 115.00 cm/s MV E/A ratio:  0.89 Armanda Magic MD Electronically signed by Armanda Magic MD Signature Date/Time: 05/24/2020/11:10:22 AM    Final     Cardiac Studies    Echocardiogram 05/24/2020: EF 60 to 65%.  No R WMA.  "Normal "diastolic parameters.  Normal RV.  Mild MR.  Mild aortic valve  sclerosis without stenosis.  (Poor quality)   Cardiac Cath 06/05/2020: Mid LAD 65%.  iFR (0.93) and FFR (0.84) both negative for hemodynamic significance.  Mildly elevated LVEDP. ->  Recommend medical management.  Patient Profile     69 y.o. female with a history of hypertension, hyperlipidemia, diabetes mellitus, and prior CVA but no known cardiac history who was admitted for NSTEMI on 05/23/2020 after presenting with exertional chest pain that radiated to her left arm.   Assessment & Plan    NSTEMI  High-sensitivity troponin elevated at 36 >> 119 >> 3,786 >> 6,628.  Echo showed LVEF of 60-65% with normal diastolic dysfunction. LV endocardial border not optimally defined to evaluate regional wall motion.  Had been on IV Heparin, can discontinue post-cath - with plans to initiate DOAC this evening.  Continue aspirin, beta-blocker, and high-intensity statin.  Presented for cardiac catheterization today. She was consented for procedure yesterday. - >    Cardiac Cath revealed moderate single-vessel disease in the LAD with IFR/FFR -65% mid LAD lesion.  Recommendations medical management. -->  Notable benefit here is that she does not require antiplatelet agent.  ??  If she became ischemic in the setting of occult A. fib RVR with existing lesion.  Paroxysmal Atrial Fibrillation  Patient developed new onset atrial fibrillation overnight 05/24/2020.   Maintaining sinus rhythm with baseline rates in the 50's to 60's.   Potassium 3.9.   Magnesium 2.0.   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 Will defer to primary team (however will be reluctant on making significant titrations based on A. fib)  Continue Lopressor 25mg  twice daily --> given the fact she had elevated troponin with existing CAD, low threshold to consider AAD if she were to have recurrent episodes of A. fib.  CHA2DS2-VASc = 7 (presumed CAD, HTN, DM,  CVA x2, age, female).    Has been on heparin.   Will likely start Eliquis after cath -can start receiving 5 mg twice daily  Hypertension  BP soft at times but stable.  Continue Lopressor as above.  Hyperlipidemia  Lipid panel this admission: Total Cholesterol 115, Triglycerides 102, HDL 39, LDL 56.   On Crestor 5mg  daily at home but this was increased to 40mg  this admission. Continue higher dose for endothelial stabilization..  Will need repeat lipid panel and LFTs in 6-8 weeks.  Diabetes Mellitus  Management per primary team.    For questions or updates, please contact [facility] Please consult [email or web address] for contact info under        Signed, , PA-C  05/26/2020, 6:25 AM     ATTENDING ATTESTATION  I have seen, examined and evaluated the patient this AM in the CATH LAB along with , PA.  After reviewing all the available data and chart, we discussed the patients laboratory, study & physical findings as well as symptoms in detail. I agree with your findings, examination as well as impression recommendations as per our discussion.    Attending adjustments noted in italics.   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    Per discussion with our research team, she does meet inclusion/exclusion criteria for the Empact study.  She has voiced interest in being considered for the study.  Would plan for outpatient 30-day event monitor to evaluate for A. fib burden.  [facility] will sign off.  Okay to discharge from cardiac standpoint following bedrest.   Medication Recommendations: Initiate Eliquis 5 mg p.o. twice daily tonight. -Order written.  We will also provide prescription. Other recommendations (labs, testing, etc): We will order 30-day event monitor to be delivered to the patient after discharge. Follow up as an outpatient: We will  arrange follow-up with APP at [facility] line    07/26/2020, M.D., M.S. Interventional Cardiologist   Pager # [phone] Phone # [phone] [address] [place], [phone] [address]

## 2020-05-26 NOTE — Progress Notes (Signed)
ANTICOAGULATION CONSULT NOTE  Pharmacy Consult for apixaban Indication: afib  Patient Measurements: Height: 5\' 5"  (165.1 cm) Weight: 93.4 kg (205 lb 14.4 oz) IBW/kg (Calculated) : 57 Heparin Dosing Weight: 77.1 kg  Vital Signs: Temp: 98 F (36.7 C) (10/04 0935) Temp Source: Oral (10/04 0935) BP: 111/66 (10/04 0935) Pulse Rate: 50 (10/04 0935)  Labs: Recent Labs     0000 05/23/20 2051 05/23/20 2051 05/23/20 2221 05/24/20 0330 05/24/20 0835 05/24/20 0835 05/25/20 0238 05/25/20 0238 05/25/20 1040 05/25/20 1848 05/26/20 0306  HGB   < > 12.7  --   --   --   --   --  13.9  --   --   --  12.8  HCT  --  38.8  --   --   --   --   --  43.9  --   --   --  41.2  PLT  --  354  --   --   --   --   --  348  --   --   --  330  HEPARINUNFRC  --   --   --   --   --  0.29*   < > 0.22*   < > 0.29* 0.46 0.62  CREATININE  --  1.04*  --   --   --   --   --   --   --   --   --  1.18*  TROPONINIHS  --  36*   < > 119* 3,786* 6,628*  --   --   --   --   --   --    < > = values in this interval not displayed.   Estimated Creatinine Clearance: 52.3 mL/min (A) (by C-G formula based on SCr of 1.18 mg/dL (H)).  Medical History: Past Medical History:  Diagnosis Date  . Depression   . Diabetes mellitus without complication (HCC)   . Hyperlipidemia 10/19/2018  . Hypertension   . Hypothyroidism   . Stroke (HCC) 10/19/2018   Medications:  Infusions:  . sodium chloride    . sodium chloride     Assessment: 69yo female c/o CP now s/p cath for medical treatment only. She is noted with afib and apixaban to start tonight -hg= 12.8, SCr= 1.1  Goal of Therapy:  Heparin level 0.3-0.7 units/ml Monitor platelets by anticoagulation protocol: Yes   Plan:  -Start apixaban 5mg  po bid at 10pm -Will provide patient education  69yo, PharmD Clinical Pharmacist **Pharmacist phone directory can now be found on [email or web address] ([email or web address]).  Listed under [facility].

## 2020-05-26 NOTE — Progress Notes (Signed)
ANTICOAGULATION CONSULT NOTE  Pharmacy Consult for heparin Indication: chest pain/ACS  Assessment: 69yo female c/o CP radiating to LUE and associated w/ mild SOB, troponin mildly elevated and increasing, to begin heparin. Transferred from [facility].   Heparin level remains therapeutic this am 0.62 units/ml  Goal of Therapy:  Heparin level 0.3-0.7 units/ml Monitor platelets by anticoagulation protocol: Yes   Plan:  Continue heparin IV at 1500 units/hr F/u AM HL  Continue to monitor for s/sx bleeding  Thanks for allowing pharmacy to be a part of this patient's care.  Talbert Cage, PharmD Clinical Pharmacist

## 2020-05-26 NOTE — Research (Signed)
EMPACT-MI Informed Consent   Subject Name: [name]  Subject met inclusion and exclusion criteria.  The informed consent form, study requirements and expectations were reviewed with the subject and questions and concerns were addressed prior to the signing of the consent form.  The subject verbalized understanding of the trail requirements.  The subject agreed to participate in the EMPACT- MI  trial and signed the informed consent.  The informed consent was obtained prior to performance of any protocol-specific procedures for the subject.  A copy of the signed informed consent was given to the subject and a copy was placed in the subject's medical record.  Mena Goes. 05/26/2020, 2:24 PM

## 2020-05-26 NOTE — Progress Notes (Signed)
   05/26/20 0935  Assess: MEWS Score  Temp 98 F (36.7 C)  BP 111/66  Pulse Rate (!) 50  ECG Heart Rate (!) 50  Resp 13  Level of Consciousness Alert  SpO2 96 %  O2 Device Room Air  Assess: MEWS Score  MEWS Temp 0  MEWS Systolic 0  MEWS Pulse 1  MEWS RR 1  MEWS LOC 0  MEWS Score 2  MEWS Score Color Yellow  Assess: if the MEWS score is Yellow or Red  Were vital signs taken at a resting state? Yes  Focused Assessment No change from prior assessment  Early Detection of Sepsis Score *See Row Information* Low  MEWS guidelines implemented *See Row Information* Yes  Treat  MEWS Interventions Escalated (See documentation below)  Pain Scale 0-10  Pain Score 0  Take Vital Signs  Increase Vital Sign Frequency  Yellow: Q 2hr X 2 then Q 4hr X 2, if remains yellow, continue Q 4hrs  Escalate  MEWS: Escalate Yellow: discuss with charge nurse/RN and consider discussing with provider and RRT  Notify: Charge Nurse/RN  Name of Charge Nurse/RN Notified Stephanie, RN  Date Charge Nurse/RN Notified 05/26/20  Time Charge Nurse/RN Notified 0935  Document  Patient Outcome Other (Comment) (just returned from cath lab stable)  Progress note created (see row info) Yes

## 2020-05-26 NOTE — Discharge Instructions (Addendum)
NSTEMI: NO HEAVY LIFTING X 2 WEEKS. NO SEXUAL ACTIVITY X 2 WEEKS. NO DRIVING X 2-3 DAYS. NO SOAKING BATHS, HOT TUBS, POOLS, ETC., X 7 DAYS.  Radial Site Care: Refer to this sheet in the next few weeks. These instructions provide you with information on caring for yourself after your procedure. Your caregiver may also give you more specific instructions. Your treatment has been planned according to current medical practices, but problems sometimes occur. Call your caregiver if you have any problems or questions after your procedure. HOME CARE INSTRUCTIONS  You may shower the day after the procedure.Remove the bandage (dressing) and gently wash the site with plain soap and water.Gently pat the site dry.   Do not apply powder or lotion to the site.   Do not submerge the affected site in water for 3 to 5 days.   Inspect the site at least twice daily.   Do not flex or bend the affected arm for 24 hours.   No lifting over 5 pounds (2.3 kg) for 5 days after your procedure.   Do not drive home if you are discharged the same day of the procedure. Have someone else drive you.  What to expect:  Any bruising will usually fade within 1 to 2 weeks.   Blood that collects in the tissue (hematoma) may be painful to the touch. It should usually decrease in size and tenderness within 1 to 2 weeks.  SEEK IMMEDIATE MEDICAL CARE IF:  You have unusual pain at the radial site.   You have redness, warmth, swelling, or pain at the radial site.   You have drainage (other than a small amount of blood on the dressing).   You have chills.   You have a fever or persistent symptoms for more than 72 hours.   You have a fever and your symptoms suddenly get worse.   Your arm becomes pale, cool, tingly, or numb.   You have heavy bleeding from the site. Hold pressure on the site.    Information on my medicine - ELIQUIS (apixaban)  Why was Eliquis prescribed for you? Eliquis was prescribed for you  to reduce the risk of a blood clot forming that can cause a stroke if you have a medical condition called atrial fibrillation (a type of irregular heartbeat).  What do You need to know about Eliquis ? Take your Eliquis TWICE DAILY - one tablet in the morning and one tablet in the evening with or without food. If you have difficulty swallowing the tablet whole please discuss with your pharmacist how to take the medication safely.  Take Eliquis exactly as prescribed by your doctor and DO NOT stop taking Eliquis without talking to the doctor who prescribed the medication.  Stopping may increase your risk of developing a stroke.  Refill your prescription before you run out.  After discharge, you should have regular check-up appointments with your healthcare provider that is prescribing your Eliquis.  In the future your dose may need to be changed if your kidney function or weight changes by a significant amount or as you get older.  What do you do if you miss a dose? If you miss a dose, take it as soon as you remember on the same day and resume taking twice daily.  Do not take more than one dose of ELIQUIS at the same time to make up a missed dose.  Important Safety Information A possible side effect of Eliquis is bleeding. You should call your healthcare  provider right away if you experience any of the following: ? Bleeding from an injury or your nose that does not stop. ? Unusual colored urine (red or dark brown) or unusual colored stools (red or black). ? Unusual bruising for unknown reasons. ? A serious fall or if you hit your head (even if there is no bleeding).  Some medicines may interact with Eliquis and might increase your risk of bleeding or clotting while on Eliquis. To help avoid this, consult your healthcare provider or pharmacist prior to using any new prescription or non-prescription medications, including herbals, vitamins, non-steroidal anti-inflammatory drugs (NSAIDs) and  supplements.  This website has more information on Eliquis (apixaban): http://www.eliquis.com/eliquis/home

## 2020-05-26 NOTE — Interval H&P Note (Signed)
History and Physical Interval Note:  05/26/2020 8:24 AM  [name]  has presented today for surgery, with the diagnosis of NSTEMI.  The various methods of treatment have been discussed with the patient and family. After consideration of risks, benefits and other options for treatment, the patient has consented to  Procedure(s): LEFT HEART CATH AND CORONARY ANGIOGRAPHY (N/A) as a surgical intervention.  The patient's history has been reviewed, patient examined, no change in status, stable for surgery.  I have reviewed the patient's chart and labs.  Questions were answered to the patient's satisfaction.   Cath Lab Visit (complete for each Cath Lab visit)  Clinical Evaluation Leading to the Procedure:   ACS: Yes.    Non-ACS:    Anginal Classification: CCS IV  Anti-ischemic medical therapy: Minimal Therapy (1 class of medications)  Non-Invasive Test Results: No non-invasive testing performed  Prior CABG: No previous CABG        Theron Arista [facility] 05/26/2020 8:24 AM

## 2020-05-26 NOTE — TOC Progression Note (Signed)
Late Entry: Pt viewed cardiac cath and atrial fibrillation video.

## 2020-05-26 NOTE — TOC Benefit Eligibility Note (Signed)
Transition of Care [facility]) Benefit Eligibility Note    Patient Details  Name: [name] MRN: [id number] Date of Birth: [birth year]   Medication/Dose: Eliquis 40m. bid, Xarelto  279mdaily, Farxiga 1040mdaily  Covered?: Yes  Tier: 3 Drug (Farxiga tier 4)  Prescription Coverage [facility]erred Pharmacy: [facility]  Spoke with Person/Company/Phone Number:: SteHeloise OchoaW[employer]#DS#[phone]-Pay: $9.20  Prior Approval: No  Deductible: Met       HawShelda Altesone Number: 05/26/2020, 12:39 PM

## 2020-05-26 NOTE — Progress Notes (Signed)
Patient ID: [name], female   DOB: 1950/12/22, 69 y.o.   MRN: [id number] Patient enrolled for [employer] to ship a 30 day cardiac event monitor to her home.  Letter with instructions mailed to patient.

## 2020-05-26 NOTE — Progress Notes (Signed)
Ordered Event Monitor for assessment of atrial fibrillation burden. Please see Dr. Elissa Hefty rounding note from today for more details.  Corrin Parker, PA-C 05/26/2020 11:52 AM

## 2020-05-27 MED FILL — Nitroglycerin IV Soln 100 MCG/ML in D5W: INTRA_ARTERIAL | Qty: 10 | Status: AC

## 2020-05-27 NOTE — Research (Signed)
[id number]   Visit 2A                  INDEX HOSPITALIZATION                             Date of discharge if prior or at the same date of the randomization: _10-4-2021_______      (leave empty if later)  Additional information on the Index Myocardial Infarction:   Date of Myocardial Infarction diagnosis:  ___10-02-2021_______  Type of Myocardial Infarction;     STEMI   []     NSTEMI  [x]   Was fibrinolytic therapy given?    NO  [x]   YES  []    Was a coronary angiography performed:  NO  []   YES  [x]        Date of Angiography: _10-4-2021________-       Finding:   1-vessel disease     [x]    2-vessel disease  []    3-vessel disease []   Non-obstructive coronary arteries []    Was revascularization performed?  NO  [x]    YES  []       Date of revascularization: _______      Type of revascularization:   PCI  []      CABG   []       If no, reason for not revascularizing:   Non-obstructive coronary arteries [x]        OTHER []   In addition to coronary angiography/revascularization procedures, did the       Patient receive contrast agent?        NO  []     YES   [x]         Date of patient receiving contrast agent:   __10-02-2021________  Has the patient had symptoms and/or signs of HF that required treatment, at      Any time during the hospitalization?        Symptoms of HF:          NO  []     YES[x]     Dyspnea:  NO  []    YES  [x]     Decreased exercise tolerance:  NO  []     YES  [x]    Fatigue:  NO  [x]    YES  []    Edema:  NO  [x]    YES  []    Other:   NO  [x]    YES  []      Signs of HF after the MI onset:  NO  []    YES   []    Pulmonary rales/crackles/crepitations:  NO  [x]    YES []    Peripheral edema:  NO  [x]    YES  []    Increase jugular venous pressure:  NO  [x]    YES  []     Imaging or lab evidence consistent with HF (e.g. radiology or ultrasound         evidence of congestions, elevated natriuretic peptides, or other         invasive/non-invasive signs of elevated filling  pressures:                    NO  []    YES  [x]        Other signs of volume overload;   NO  [x]    YES  []   Treatment given:   NO  [x]     YES  []         Initiation or augmentation of oral  diuretic:  NO  [x]    YES  []    IV diuretic or vasoactive agent; NO  []    YES  [x]    Mechanical or surgical intervention:  NO  [x]    YES  []    Mechanical fluid removal:  NO  [x]    YES  []   Please select the last LVEF measurement (by ECHO, ventriculography,   Cardiac CT, MRI or radiionuclide imaging during the INDEX hospitalization:         (enter exact value- if unkown, choose LVEF range)    LVEF Exact Value: ____________        LVEF Range:    LVEF < 15%  []      LVEF 15% - <25%  []            LVEF 25% - <35% []      LVEF 35% - <45% []      LVEF 45% - <55% []               LVEF >= 55% [x]    Did the patient have clinically significant acute kidney injury during INDEX       hospitalization prior to randomization:   NO  []     YES  [x]         DRUG ADMINISTRATION  Date of first drug intake;     ___10-05-2021_________   VISIT 2A                                            Visit Type:    [facility] / Medical Records review  Local laboratory Values:     Specimen Collection Date: _10-4-2021_________  All results are from INDEX HOSPITALIZATION (For tests not done, enter ND)  BNP (highest value) :  ND ___  NT-proBNP (highest value): ___ND_______  HGB A1c, Glycated (most recent value):   ___6.3______  HGB A1c, IFCC Standardization (most recent):  __ND_________  Hemoglobin (most recent Value):  __12.8____  Potassium (Most recent value):  ____3.9_______  LDL Cholesterol (most recent value):  ____56____  Serum Creatinine (Mandatory most recent)   __1.18________  Uric Acid (Highest value):  __ND_______  VITAL SIGNS: Height  (cm): ___165_______  Weight (kg): ___93.4______  Pulse rate (beats/min):   __55______  Systolic BP (mmHg):   __108________  Diastolic BP (mmHg):   ___62____

## 2020-05-27 NOTE — Research (Signed)
[id number] Folder: Visit 01 Form: DEMOGRAPHICS:                                                          VISIT DATE:               __10-4-2021_________  VISIT TYPE       Clinic Visit    [x]     Phone Visit  []     Web Based Visit  []     Web + Phone Visit  []     Medical Records Review only  []  ___________________________________________________________ Patient Preference follow-up mode:      Telephone  [x]           Web-based  []  __________________________________________________________ Web Based:              App []     Web-Portal  []  __________________________________________________________ Sex :     Female  []               Female  [x]             If Female, is the subject of childbearing potential?   No  [x]           Yes  []  __________________________________________________________ Gender Identity:   Female []     Female  [x]     Not Answered  []     Other (Specify)  []  ______________ ________________________________________________ Ethnicity:                    Hispanic or Latino  []     Not Hispanic or Latino  [x]  __________________________________________________________ Race:  (Select all applicable, but at least one)         American or Native : No []   Yes  []     Asian:  No []   Yes  []           If yes, Specify;   Asian  []       Southeast Asian  []        []        []        []       Chinese  []       Other Asian  []            Black or African American:  No  []  Yes  []     Native Hawaiian or Other Pacific Islander: No  []  Yes  []      White:   No  []   Yes[x]  _______________________________________________  Birth Year:     __1952_________________ _________________________________________________________  Age (at time of Informed Consent):   ____68____   Years   Folder: Visit 01    Smoking status / Medical History               SMOKING STATUS:  _______________________________________________  Has the subject ever  smoked cigarettes?     Never     [x]       (Does not include use of e-cigarettes) Current   []        Former   []  _______________________________________________  MEDICAL HISTORY:  _______________________________________________ Hypertension:       No  []    Yes  [x]   Hypercholesterolemia   No  []    Yes  [x]   Chronic Obstructive Pulmonary Disease:     No  [  x]  Yes  []   Peripheral artery disease (extracoronary):   No  [x]   Yes  []   Lower extremity peripheral artery disease:   No  [x]   Yes  []      If so,Symptomatic: claudication/ critical limb ischemia and/or                        any Revascularization         No  []    Yes  []   Lower Limb amputation  No  [x]    Yes  []   Carotid disease (known carotid stenosis, prior carotid stent or      prior carotid endarterectomy No  [x]    Yes  []   Atrial Fibrillation  No  []    Yes  [x]       Specify: Paroxysmal  [x]    Persistent  []   Permanent  []   MI prior to Index MI (confirmed diagnosis in patient's medical record)                                    No  [x]    Yes  []   Coronary revascularization prior to index MI:   No  [x]   Yes  []   Coronary Revascularization: specify:            Percutaneous Coronary Intervention (PCI)  [x]            Coronary Artery Bypass Grafting (CABG)    []   Chronic Kidney Disease        No  []   Yes  [x]   Prior Stroke / TIA     No  []   Yes  [x]   History of chronic liver disease:   No  [x]    Yes  []   History of Malignancies in the past 2 years:  No  [x]   Yes  []    Valvular heart disease (only moderate):  No  [x]    Yes   []            Specify: Aortic stenosis  []    Aortic Regurgitation []    Mitral Regurgitation []    Other   []   Cardiac Device:         No  [x]   Yes  []           Specify: Implantable cardioversion    []  Defibrillator   []  Cardiac Resynchronization  Therapy (CRT-P)  []  Cardiac Resynchronization Therapy (CRT-D  []    History of a COVID-19 diagnosis:   No [x]    Yes[]

## 2020-05-27 NOTE — Research (Signed)
[id number]                                                        Folder:  VISIT 1 Form: Informed Consent                                                        Type of consent:         Paper Consent  [x]     eConsent  []  _______________________________________________  Informed Consent Type        Main  [x]  _______________________________________________  Informed Consent Version ID     __2.0___________ _______________________________________________  Informed Consent Obtained       No  []    Yes  [x]  _______________________________________________  Informed Consent Date; __10-4-2021____________ _______________________________________________   Date: _10-4-2021_____ Site Number: [id number]                                            Subject Number:   CONSENT:  Date of Consent: 04-OCT-2021____  ([id number])                Version: ___2.0_______  Copy of Signed consent given to subject?   [x]   Yes   []   No  Consent process documented?  [x]   Yes  []  No  INCLUSION CRITERIA : I1. Of full age of consent (according to local legislation, at least >/= 18 years) at screening     []  NO  [x]  YES  I2. Signed and dated written informed consent in accordance with ICH-GCP and local legislation prior              to admission to the trial     []  NO  [x]   YES  I3. Female or female patients. Women of childbearing potential must be ready and able to use highly effective      methods of birth control per ICH M3 (R2) that result in a low failure rate of less than 1% per year when   used consistently and correctly. A list of contraception methods meeting these criteria is provided in   the patient information and in Section 4.3.2.3 of the protocol.           []  NO [x]   YES  Women considered NOT of childbearing potential are those permanently sterilized or postmenopausal.  Postmenopausal is defined as 12 months with no menses without an alternative medical cause.   I4. Diagnosis of Acute MI (type 1  per the Universal Definition of Myocardial Infarction): STEMI OR NSTEMI  with radomization to occur no later than 14 calendar days after hospital admission. For patients   with an in-hospital MI as qualifying event, randomization must occur within 14 days of hospital              admission.              []   NO  [x]  YES  I5. High risk of Heart Failure, defined as EITHER:    [x]   a) Symptoms (e.g., dyspnea: decreased exercise tolerance,fatigue): or  signs of congestion    (e.g., pulmonary rales, crackles or crepitations, elevated jugular venous pressure, congestion   on chest X-ray), that require treatment (e.g., augmentation of initiation of oral diuretic    therapy , IV diuretic therapy: IV vasoactive agent, mechanical intervention, etc) at ANY time   during the hospitalization.       OR  []  b) Newly developed LVEF < 45% as measured by echocardiography, ventriculography,    cardiac CT, MRI, or radionuclide imaging during index hospitalization.   I6. In addition, the subject must have at least ONE of the following established risk factors:    []  a) Age >/= 65 years.  []  b) Newly developed LVEF < 35%.  []  c) Prior MI (before the index MI) documented in medical records.  [x]  d) eGFR < 60 ml/min/1.75m (using CKD-EPI formula based on creatinine from local lab   at ANY time during index hospitalization).   [x]  e) Atrial fibrillation (persistent or permanent: if paroxysmal, only valid if associated with index MI).  [x]  f) Type 2 diabetes mellitus (prior or new diagnosis).  []  g). NT-ProBNP >/= 1400 pg/mL for patients in sinus rhythm, >/= 2800 pg/mL if atrial fibrillation:    BNP >/= 350 pg/mL for patients in sinus rhythm, >/= 700 pg/mL if atrial fibrillation,   Measured at ANY time during hospitalization.   []  h) Uric Acid >/= 7.5 mg/dL (>/= 446 umo/L) measured at ANY time during hospitalization.   []  I) Pulmonary Artery Systolic Pressure >/= 40 mmHg (non-invasive [usually obtained from  clinically   indicated post-MI echocardiography] or invasive, at ANY time during hospitalization).  []  j) Patient not revascularized (and no planned revascularization) for the index MI (includes, e.g.   Patients where no angiography is performed, unsuccessful revascularization attempts, diffuse   atherosclerosis not amenable for intervention: but does NOT include if revascularization was   not performed due to nonobstructive coronary arteries).   []  k) 3-vessel coronary artery disease at the time of index MI  []  l) Diagnosis of peripheral artery disease (extracoronary vascular disease, e.g., lower extremity    artery disease or carotid artery disease).  EXCLUSION CRITERIA:    E1. Diagnosis of chronic Heart Failure prior to index MI.     [x]  NO  []  YES   E2. Systolic blood pressure </= 90 mmHG at randomization.        [x]  NO  []  YES  E3. Cardiogenic Shock or use of IV inotropes in last 24 hours before randomization.  [x]  NO  []  YES  E4. Coronary Artery Bypass Grafting planned at time of randomization  [x]   NO  []   YES  E5. Current diagnosis of Takotsubo Cardiomyopathy.    [x]   NO  []   YES  E6. Any current severe (stenotic or regurgitant) valvular heart disease.  [x]  NO  []   YES  E7. eGFR < 20 ml/min/1.746m(using CKD-EPI formula based on most recent creatinine from Local   Lab during index hospitalization) or on dialysis.    [x]  NO  []  YES  E8. Type I Diabetes Melitus.     [x]   NO  []   YES  E9. History of Ketoacidosis   [x]  NO  []   YES  E10 Current use or planned treatment with an SGLT-2 inhibitor or combined SGLT-1 and 2 Inhibitor.    Discontinuation of SGLT-2 inhibitor or combined SGLT-1 and 2 inhibitor for the purposes   of enrollment in the trial is not permitted.   [x]   NO  []   YES  E11. Contraindication for using empagliflozin or any other SGLE-2 inhibitor.   [x]  NO []  YES  E12. Any physical or mental condition significantly affecting the patient's ability to participate   in the  Investigator's opinion.   [x]  NO []  YES  E13. Any other clinical condition that would jeopardize patient's safety while participating in this   study or may prevent the patient from adhering to the trial protocol in the    Investigator's opinion.   [x]  NO  []  YES  E14. Presence of any other disease than the acute MI or its immediate complication with a    life expectancy of < 1 year in the opinion of the Investigator.  [x]  NO  []  YES  E15. Current or previous randomization in one of the empagliflozin heart failure trials    (I.e., trails 1245.110, 1245.121, [id number], [id number], [phone]) or currently   enrolled in another investigational device or drug trial, or less than 30 days    since ending another investigational device or drug trial or receiving other    Investigational treatment(s). Patients participating in purely observational   Trial will not be excluded.      [x]   NO  []  YES  E16. Women who are pregnant, nursing or who plan to become pregnant while in the trial  [x]  NO []  YES   RANDOMIZATION:    Was subject randomized?   [x]  YES  []  NO ( if NO, complete Screen Fail Reason)    Randomization / Subject Number: ___[id number]_________  OR Screen Fail Reason:   []   Did not meet eligibility criteria Inclusion #  ______ or Exclusion # _______  []   Serious Adverse event  []   Study Closed/ terminated  []   Investigator discretion (specify):_____________________________  []   Withdrew Consent (specify): ______________________  []   Screened but enrollment target reached prior to enrollment.      [id number]-PROD           EMPACT-MI Eligibility Worksheet_05Apr2021  (66

## 2020-06-09 ENCOUNTER — Encounter: Payer: Self-pay | Admitting: *Deleted

## 2020-06-09 DIAGNOSIS — Z006 Encounter for examination for normal comparison and control in clinical research program: Secondary | ICD-10-CM

## 2020-06-09 NOTE — Research (Signed)
EMPACT-MI  Visit 3 Week 2 phone call   Patient is doing well with no CP. She does have a sinus infection but reports no SHOB. [name] states that she is taking her study drug as directed and has had no complications or side effects. She has an appointment with Dr. Jens Som on 06-16-20 for follow-up from her MI hospitalization.   [id number] Visit 3                                   Visit Date:  _10-18-2021__________-  Visit Type:                 Clinic Visit  []              Phone Visit  [x]                         Web Based Visit   []                      Web + Phone Visit  []    Medical Records Review only  []   Hospitalization Confirmation  Was a hospitalization reported by the patient in the eCOA questionnaire?               NO  [x]      YES   []   Was the hospitalization confirmed in the telephone follow-up contact?       NO  []     YES   [x]   Medication Compliance  Study medication being taken according to protocol?    NO  []    YES  [x]   SGLT1 / SGLT2 Inhibitor Treatment  Is the patient currently on active SGLT1/2 - inhibitor (e.g. empagliflozin,     Dapagliflozin, ...) treatment (not to include empagliflozin as study drug)?                  NO  [x]    YES  [] 

## 2020-06-10 NOTE — Progress Notes (Signed)
HPI: Follow-up coronary artery disease.  Carotid Dopplers February 2020 showed no significant stenosis.  Recently admitted with chest pain.  Cardiac catheterization October 2021 showed 65% mid LAD.  FFR normal and therefore treated medically.  Echocardiogram October 2021 showed normal LV function.  During hospitalization also noted to have paroxysmal atrial fibrillation on telemetry.  Since last seen, she has had no chest pain, palpitations or syncope.  No bleeding.  Dyspnea with more vigorous activities.  Current Outpatient Medications  Medication Sig Dispense Refill  . ALPRAZolam (XANAX) 0.5 MG tablet Take 0.5 mg by mouth daily as needed for anxiety.     Marland Kitchen apixaban (ELIQUIS) 5 MG TABS tablet Take 1 tablet (5 mg total) by mouth 2 (two) times daily. 60 tablet 3  . aspirin EC 81 MG EC tablet Take 1 tablet (81 mg total) by mouth daily. Swallow whole. 30 tablet 1  . citalopram (CELEXA) 20 MG tablet Take 20 mg by mouth daily.    . metoprolol tartrate (LOPRESSOR) 25 MG tablet Take 1 tablet (25 mg total) by mouth 2 (two) times daily. 60 tablet 1  . rosuvastatin (CRESTOR) 40 MG tablet Take 1 tablet (40 mg total) by mouth at bedtime. 30 tablet 1  . SYNTHROID 100 MCG tablet Take 100 mcg by mouth daily before breakfast.      No current facility-administered medications for this visit.     Past Medical History:  Diagnosis Date  . Depression   . Diabetes mellitus without complication (HCC)   . Hyperlipidemia 10/19/2018  . Hypertension   . Hypothyroidism   . Stroke [facility]) 10/19/2018    Past Surgical History:  Procedure Laterality Date  . APPENDECTOMY    . BACK SURGERY    . CHOLECYSTECTOMY    . INTRAVASCULAR PRESSURE WIRE/FFR STUDY N/A 05/26/2020   Procedure: INTRAVASCULAR PRESSURE WIRE/FFR STUDY;  Surgeon: Swaziland, Peter M, MD;  Location: [facility] INVASIVE CV LAB;  Service: Cardiovascular; iFR/FFR of 65% mLAD -  iFR (0.93) and FFR (0.84) = NOT HEMODYNAMICALLY/ PHYSIOLOGICALLY SIGNIFICANT.   Marland Kitchen  LEFT HEART CATH AND CORONARY ANGIOGRAPHY N/A 05/26/2020   Procedure: LEFT HEART CATH AND CORONARY ANGIOGRAPHY;  Surgeon: Swaziland, Peter M, MD;  Location: [facility] INVASIVE CV LAB;  Service: Cardiovascular; non-STEMI = Mid LAD 65%.  FFR and IFR both negative for hemodynamic significance.  Mildly elevated LVEDP. ->  Recommend medical management.  . TRANSTHORACIC ECHOCARDIOGRAM  05/24/2020   In setting of non-STEMI: EF 60 to 65%.  No R WMA.  "Normal "diastolic parameters.  Normal RV.  Mild MR.  Mild aortic valve sclerosis without stenosis.  (Poor quality)  . VAGINAL HYSTERECTOMY      Social History   Socioeconomic History  . Marital status: Married    Spouse name: Not on file  . Number of children: Not on file  . Years of education: Not on file  . Highest education level: Not on file  Occupational History  . Not on file  Tobacco Use  . Smoking status: Never Smoker  . Smokeless tobacco: Never Used  Substance and Sexual Activity  . Alcohol use: No  . Drug use: No  . Sexual activity: Not on file  Other Topics Concern  . Not on file  Social History Narrative  . Not on file   Social Determinants of Health   Financial Resource Strain:   . Difficulty of Paying Living Expenses: Not on file  Food Insecurity:   . Worried About Programme researcher, broadcasting/film/video in  the Last Year: Not on file  . Ran Out of Food in the Last Year: Not on file  Transportation Needs:   . Lack of Transportation (Medical): Not on file  . Lack of Transportation (Non-Medical): Not on file  Physical Activity:   . Days of Exercise per Week: Not on file  . Minutes of Exercise per Session: Not on file  Stress:   . Feeling of Stress : Not on file  Social Connections:   . Frequency of Communication with Friends and Family: Not on file  . Frequency of Social Gatherings with Friends and Family: Not on file  . Attends Religious Services: Not on file  . Active Member of Clubs or Organizations: Not on file  . Attends Banker  Meetings: Not on file  . Marital Status: Not on file  Intimate Partner Violence:   . Fear of Current or Ex-Partner: Not on file  . Emotionally Abused: Not on file  . Physically Abused: Not on file  . Sexually Abused: Not on file    Family History  Problem Relation Age of Onset  . Colon cancer Father        died at 34  . Throat cancer Brother   . Breast cancer Sister        sister died at 81  . Coronary artery disease Mother        stent age 29, died at age 68 of complications from diabetes/heart disease  . Diabetes Mother   . Arrhythmia Brother     ROS: no fevers or chills, productive cough, hemoptysis, dysphasia, odynophagia, melena, hematochezia, dysuria, hematuria, rash, seizure activity, orthopnea, PND, pedal edema, claudication. Remaining systems are negative.  Physical Exam: Well-developed well-nourished in no acute distress.  Skin is warm and dry.  HEENT is normal.  Neck is supple.  Chest is clear to auscultation with normal expansion.  Cardiovascular exam is regular rate and rhythm.  Abdominal exam nontender or distended. No masses palpated. Extremities show no edema. neuro grossly intact  A/P  1 coronary artery disease-patient has not had recurrent chest pain.  Recent catheterization as outlined in HPI and medical therapy recommended.  Continue statin and beta-blocker.  DC aspirin given need for apixaban.  2 hypertension-patient's blood pressure is controlled.  Continue present medications and follow.  3 paroxysmal atrial fibrillation-patient was noted to have intermittent atrial fibrillation on telemetry during recent hospitalization.  Echocardiogram showed normal LV function.  TSH mildly elevated during recent hospitalization.  She will follow up with primary care for this issue.  Continue apixaban and metoprolol at present dose.  4 hyperlipidemia-continue statin.  In 6 weeks we will check lipids and liver.  Olga Millers, MD

## 2020-06-11 ENCOUNTER — Ambulatory Visit: Payer: Medicare HMO

## 2020-06-16 ENCOUNTER — Other Ambulatory Visit: Payer: Self-pay

## 2020-06-16 ENCOUNTER — Encounter: Payer: Self-pay | Admitting: Cardiology

## 2020-06-16 ENCOUNTER — Ambulatory Visit: Payer: Medicare HMO | Admitting: Cardiology

## 2020-06-16 VITALS — BP 122/68 | HR 62 | Ht 65.5 in | Wt 204.2 lb

## 2020-06-16 DIAGNOSIS — I10 Essential (primary) hypertension: Secondary | ICD-10-CM | POA: Diagnosis not present

## 2020-06-16 DIAGNOSIS — E785 Hyperlipidemia, unspecified: Secondary | ICD-10-CM

## 2020-06-16 DIAGNOSIS — I480 Paroxysmal atrial fibrillation: Secondary | ICD-10-CM

## 2020-06-16 DIAGNOSIS — E7800 Pure hypercholesterolemia, unspecified: Secondary | ICD-10-CM | POA: Diagnosis not present

## 2020-06-16 NOTE — Patient Instructions (Signed)
Medication Instructions:   STOP ASPIRIN  *If you need a refill on your cardiac medications before your next appointment, please call your pharmacy*  Follow-Up: At [facility], you and your health needs are our priority.  As part of our continuing mission to provide you with exceptional heart care, we have created designated Provider Care Teams.  These Care Teams include your primary Cardiologist (physician) and Advanced Practice Providers (APPs -  Physician Assistants and Nurse Practitioners) who all work together to provide you with the care you need, when you need it.  We recommend signing up for the patient portal called "MyChart".  Sign up information is provided on this After Visit Summary.  MyChart is used to connect with patients for Virtual Visits (Telemedicine).  Patients are able to view lab/test results, encounter notes, upcoming appointments, etc.  Non-urgent messages can be sent to your provider as well.   To learn more about what you can do with MyChart, go to [email or web address].    Your next appointment:   6 month(s)  The format for your next appointment:   In Person  Provider:   Brian Crenshaw, MD    

## 2020-07-03 ENCOUNTER — Other Ambulatory Visit: Payer: Self-pay

## 2020-07-03 ENCOUNTER — Other Ambulatory Visit: Payer: Self-pay | Admitting: Diagnostic Radiology

## 2020-07-03 ENCOUNTER — Ambulatory Visit
Admission: RE | Admit: 2020-07-03 | Discharge: 2020-07-03 | Disposition: A | Discharge status: Home / Self Care | Payer: Medicare HMO | Source: Ambulatory Visit | Attending: Family Medicine | Admitting: Family Medicine

## 2020-07-03 DIAGNOSIS — Z1231 Encounter for screening mammogram for malignant neoplasm of breast: Secondary | ICD-10-CM

## 2020-07-04 ENCOUNTER — Other Ambulatory Visit: Payer: Self-pay | Admitting: *Deleted

## 2020-07-04 DIAGNOSIS — Z9189 Other specified personal risk factors, not elsewhere classified: Secondary | ICD-10-CM

## 2020-08-02 ENCOUNTER — Emergency Department (HOSPITAL_COMMUNITY)
Admission: EM | Admit: 2020-08-02 | Discharge: 2020-08-03 | Disposition: A | Discharge status: Home / Self Care | Payer: Medicare HMO | Attending: Emergency Medicine | Admitting: Emergency Medicine

## 2020-08-02 ENCOUNTER — Other Ambulatory Visit: Payer: Self-pay

## 2020-08-02 ENCOUNTER — Encounter (HOSPITAL_COMMUNITY): Payer: Self-pay

## 2020-08-02 ENCOUNTER — Emergency Department (HOSPITAL_COMMUNITY): Payer: Medicare HMO

## 2020-08-02 DIAGNOSIS — I10 Essential (primary) hypertension: Secondary | ICD-10-CM | POA: Insufficient documentation

## 2020-08-02 DIAGNOSIS — Z955 Presence of coronary angioplasty implant and graft: Secondary | ICD-10-CM | POA: Diagnosis not present

## 2020-08-02 DIAGNOSIS — E119 Type 2 diabetes mellitus without complications: Secondary | ICD-10-CM | POA: Diagnosis not present

## 2020-08-02 DIAGNOSIS — E039 Hypothyroidism, unspecified: Secondary | ICD-10-CM | POA: Diagnosis not present

## 2020-08-02 DIAGNOSIS — R002 Palpitations: Secondary | ICD-10-CM | POA: Diagnosis present

## 2020-08-02 DIAGNOSIS — Z8673 Personal history of transient ischemic attack (TIA), and cerebral infarction without residual deficits: Secondary | ICD-10-CM | POA: Insufficient documentation

## 2020-08-02 DIAGNOSIS — Z79899 Other long term (current) drug therapy: Secondary | ICD-10-CM | POA: Insufficient documentation

## 2020-08-02 DIAGNOSIS — Z7901 Long term (current) use of anticoagulants: Secondary | ICD-10-CM | POA: Diagnosis not present

## 2020-08-02 LAB — CBC WITH DIFFERENTIAL/PLATELET
Abs Immature Granulocytes: 0.02 10*3/uL (ref 0.00–0.07)
Basophils Absolute: 0.1 10*3/uL (ref 0.0–0.1)
Basophils Relative: 1 %
Eosinophils Absolute: 0.3 10*3/uL (ref 0.0–0.5)
Eosinophils Relative: 4 %
HCT: 41.2 % (ref 36.0–46.0)
Hemoglobin: 13.7 g/dL (ref 12.0–15.0)
Immature Granulocytes: 0 %
Lymphocytes Relative: 33 %
Lymphs Abs: 3 10*3/uL (ref 0.7–4.0)
MCH: 31.4 pg (ref 26.0–34.0)
MCHC: 33.3 g/dL (ref 30.0–36.0)
MCV: 94.3 fL (ref 80.0–100.0)
Monocytes Absolute: 0.7 10*3/uL (ref 0.1–1.0)
Monocytes Relative: 8 %
Neutro Abs: 4.9 10*3/uL (ref 1.7–7.7)
Neutrophils Relative %: 54 %
Platelets: 336 10*3/uL (ref 150–400)
RBC: 4.37 MIL/uL (ref 3.87–5.11)
RDW: 13.2 % (ref 11.5–15.5)
WBC: 9 10*3/uL (ref 4.0–10.5)
nRBC: 0 % (ref 0.0–0.2)

## 2020-08-02 LAB — BASIC METABOLIC PANEL
Anion gap: 10 (ref 5–15)
BUN: 18 mg/dL (ref 8–23)
CO2: 24 mmol/L (ref 22–32)
Calcium: 8.9 mg/dL (ref 8.9–10.3)
Chloride: 107 mmol/L (ref 98–111)
Creatinine, Ser: 0.92 mg/dL (ref 0.44–1.00)
GFR, Estimated: >60 mL/min (ref >60)
Glucose, Bld: 137 mg/dL — ABNORMAL HIGH (ref 70–99)
Potassium: 4.4 mmol/L (ref 3.5–5.1)
Sodium: 141 mmol/L (ref 135–145)

## 2020-08-02 LAB — TROPONIN I (HIGH SENSITIVITY): Troponin I (High Sensitivity): 6 ng/L (ref <18)

## 2020-08-02 MED ORDER — METOPROLOL TARTRATE 25 MG PO TABS
25.0000 mg | ORAL_TABLET | Freq: Once | ORAL | Status: AC
  Administered 2020-08-03: 25 mg via ORAL
  Filled 2020-08-02: qty 1

## 2020-08-02 NOTE — ED Triage Notes (Signed)
Patient arrives from home with [facility] for palpitations, hx of a fib, symptoms have resolved, MI about 1 month ago, currently in afib.    EMS vitals 141/110 90-110 HR CBG 134

## 2020-08-02 NOTE — ED Provider Notes (Signed)
[facility] EMERGENCY DEPARTMENT Provider Note   CSN: [id number] Arrival date & time: 08/02/20  2127     History Chief Complaint  Patient presents with   Palpitations    [name] is a 69 y.o. female.  HPI   69 year old female with a history of depression, diabetes, hyperlipidemia, hypertension, hypothyroidism, CVA, NSTEMI, who presents the emergency department today for evaluation of palpitations.  States she was at home prior to arrival when she started having palpitations.  She laid down and symptoms improved.  She contacted EMS and upon their arrival her symptoms are resolved.  In route her heart rate was in the 90s to the 110s with stable blood pressure.  She denies any associated chest pain, shortness of breath, diaphoresis or other symptoms.  She states she has been compliant with her A. fib medications but has not taken her nightly dose of metoprolol.  She has been compliant with her anticoagulation.  Past Medical History:  Diagnosis Date   Depression    Diabetes mellitus without complication (HCC)    Hyperlipidemia 10/19/2018   Hypertension    Hypothyroidism    Stroke (HCC) 10/19/2018    Patient Active Problem List   Diagnosis Date Noted   NSTEMI (non-ST elevated myocardial infarction) (HCC) 05/24/2020   Hyperlipidemia 10/19/2018   TIA (transient ischemic attack) 10/18/2018   Depression 10/18/2018   Atypical chest pain 02/09/2013   Diabetes type 2, controlled (HCC) 02/09/2013   Hypertension 02/09/2013    Past Surgical History:  Procedure Laterality Date   APPENDECTOMY     BACK SURGERY     CHOLECYSTECTOMY     INTRAVASCULAR PRESSURE WIRE/FFR STUDY N/A 05/26/2020   Procedure: INTRAVASCULAR PRESSURE WIRE/FFR STUDY;  Surgeon: Swaziland, Peter M, MD;  Location: [facility] INVASIVE CV LAB;  Service: Cardiovascular; iFR/FFR of 65% mLAD -  iFR (0.93) and FFR (0.84) = NOT HEMODYNAMICALLY/ PHYSIOLOGICALLY SIGNIFICANT.    LEFT HEART CATH AND  CORONARY ANGIOGRAPHY N/A 05/26/2020   Procedure: LEFT HEART CATH AND CORONARY ANGIOGRAPHY;  Surgeon: Swaziland, Peter M, MD;  Location: [facility] INVASIVE CV LAB;  Service: Cardiovascular; non-STEMI = Mid LAD 65%.  FFR and IFR both negative for hemodynamic significance.  Mildly elevated LVEDP. ->  Recommend medical management.   TRANSTHORACIC ECHOCARDIOGRAM  05/24/2020   In setting of non-STEMI: EF 60 to 65%.  No R WMA.  "Normal "diastolic parameters.  Normal RV.  Mild MR.  Mild aortic valve sclerosis without stenosis.  (Poor quality)   VAGINAL HYSTERECTOMY       OB History   No obstetric history on file.     Family History  Problem Relation Age of Onset   Colon cancer Father        died at 75   Throat cancer Brother    Breast cancer Sister        sister died at 44   Coronary artery disease Mother        stent age 12, died at age 54 of complications from diabetes/heart disease   Diabetes Mother    Arrhythmia Brother     Social History   Tobacco Use   Smoking status: Never Smoker   Smokeless tobacco: Never Used  Substance Use Topics   Alcohol use: No   Drug use: No    Home Medications Prior to Admission medications   Medication Sig Start Date End Date Taking? Authorizing Provider  ALPRAZolam Prudy Feeler) 0.5 MG tablet Take 0.5 mg by mouth daily as needed for anxiety.  03/20/18   [provider]  apixaban (ELIQUIS) 5 MG TABS tablet Take 1 tablet (5 mg total) by mouth 2 (two) times daily. 05/26/20   Marjie Skiff E, PA-C  citalopram (CELEXA) 20 MG tablet Take 20 mg by mouth daily.    [provider]  metoprolol tartrate (LOPRESSOR) 25 MG tablet Take 1 tablet (25 mg total) by mouth 2 (two) times daily. 05/26/20   Burnadette Pop, MD  rosuvastatin (CRESTOR) 40 MG tablet Take 1 tablet (40 mg total) by mouth at bedtime. 05/26/20   Burnadette Pop, MD  SYNTHROID 100 MCG tablet Take 100 mcg by mouth daily before breakfast.  10/10/18   [provider]     Allergies    Tape  Review of Systems   Review of Systems  Constitutional: Negative for fever.  HENT: Negative for ear pain and sore throat.   Eyes: Negative for visual disturbance.  Respiratory: Negative for cough and shortness of breath.   Cardiovascular: Positive for palpitations. Negative for chest pain.  Gastrointestinal: Negative for abdominal pain, constipation, diarrhea, nausea and vomiting.  Genitourinary: Negative for dysuria and hematuria.  Musculoskeletal: Negative for back pain.  Skin: Negative for rash.  Neurological: Negative for headaches.  All other systems reviewed and are negative.   Physical Exam Updated Vital Signs BP 133/66    Pulse 82    Temp 98.4 F (36.9 C) (Oral)    Resp (!) 9    Ht 5' 5.5" (1.664 m)    Wt 92.6 kg    SpO2 92%    BMI 33.47 kg/m   Physical Exam Vitals and nursing note reviewed.  Constitutional:      General: She is not in acute distress.    Appearance: She is well-developed and well-nourished.  HENT:     Head: Normocephalic and atraumatic.  Eyes:     Conjunctiva/sclera: Conjunctivae normal.  Cardiovascular:     Rate and Rhythm: Normal rate and regular rhythm.     Heart sounds: Normal heart sounds. No murmur heard.   Pulmonary:     Effort: Pulmonary effort is normal. No respiratory distress.     Breath sounds: Normal breath sounds. No wheezing, rhonchi or rales.  Abdominal:     General: Bowel sounds are normal.     Palpations: Abdomen is soft.     Tenderness: There is no abdominal tenderness. There is no guarding or rebound.  Musculoskeletal:        General: No edema.     Cervical back: Neck supple.  Skin:    General: Skin is warm and dry.  Neurological:     Mental Status: She is alert.  Psychiatric:        Mood and Affect: Mood and affect normal.     ED Results / Procedures / Treatments   Labs (all labs ordered are listed, but only abnormal results are displayed) Labs Reviewed  BASIC METABOLIC PANEL - Abnormal;  Notable for the following components:      Result Value   Glucose, Bld 137 (*)    All other components within normal limits  CBC WITH DIFFERENTIAL/PLATELET  TSH  TROPONIN I (HIGH SENSITIVITY)  TROPONIN I (HIGH SENSITIVITY)    EKG EKG Interpretation  Date/Time:  Saturday August 02 2020 21:37:03 EST Ventricular Rate:  107 PR Interval:    QRS Duration: 82 QT Interval:  343 QTC Calculation: 475 R Axis:   -47 Text Interpretation: Atrial fibrillation LAD, consider left anterior fascicular block Borderline low voltage, extremity leads  Minimal ST depression, diffuse leads last tracing was sinus rhythm Confirmed by Richardean Canal ([id number]) on 08/02/2020 9:45:40 PM   Radiology DG Chest 1 View  Result Date: 08/02/2020 CLINICAL DATA:  Palpitations, shortness of breath EXAM: CHEST  1 VIEW COMPARISON:  05/23/2020 FINDINGS: Bibasilar scarring. Heart is normal size. No acute confluent opacities or effusions. No acute bony abnormality. IMPRESSION: Bibasilar scarring.  No active disease. Electronically Signed   By: Charlett Nose M.D.   On: 08/02/2020 21:48    Procedures Procedures (including critical care time)  Medications Ordered in ED Medications  metoprolol tartrate (LOPRESSOR) tablet 25 mg (has no administration in time range)    ED Course  I have reviewed the triage vital signs and the nursing notes.  Pertinent labs & imaging results that were available during my care of the patient were reviewed by me and considered in my medical decision making (see chart for details).    MDM Rules/Calculators/A&P                         69 y/o F presents to the ED for eval of palpitations  Reviewed/interpreted labs CBC unremarkable BMP unremarkable TSH pending at shift changed Trop initially negative, delta trop pending at shift change,   EKG - Atrial fibrillation LAD, consider left anterior fascicular block Borderline low voltage, extremity leads Minimal ST depression, diffuse leads last  tracing was sinus rhythm  - given home metoprolol  CXR - Bibasilar scarring.  No active disease.  At shift change, pt pending delta trop. Care transitioned to [facility], PA-C. If trop neg, feel pt appropriate for d/c home to f/u with cardiology.  Final Clinical Impression(s) / ED Diagnoses Final diagnoses:  Palpitations    Rx / DC Orders ED Discharge Orders    None       Karrie Meres, PA-C 08/03/20 0019    Charlynne Pander, MD 08/09/20 2008

## 2020-08-03 LAB — TROPONIN I (HIGH SENSITIVITY): Troponin I (High Sensitivity): 6 ng/L (ref <18)

## 2020-08-03 NOTE — ED Provider Notes (Signed)
2:13 AM Delta troponin negative. VSS. Patient remains asymptomatic; no additional complaints. Discharged in stable condition with instruction for cardiology follow up.   Antony Madura, PA-C 08/03/20 0245    Ward, Layla Maw, DO 08/03/20 0321

## 2020-08-03 NOTE — Discharge Instructions (Addendum)
Please continue your home medications.   Please follow up with your cardiologist.   Please return to the emergency department for any new or worsening symptoms.

## 2020-08-27 ENCOUNTER — Telehealth: Payer: Self-pay | Admitting: *Deleted

## 2020-08-27 NOTE — Telephone Encounter (Signed)
Spoke with pt, she went to the ER for chest pain recently and was ruled out for an MI. She saw her medical doctor today and they sent a note to dr Jens Som. I called the patient to make a follow up appointment and she would like to wait to schedule. She has a follow up appointment with her medical doctor in April. Patient voiced understanding to call if symptoms return, change or worsen.

## 2020-09-05 ENCOUNTER — Other Ambulatory Visit: Payer: Self-pay | Admitting: Student

## 2020-09-05 NOTE — Telephone Encounter (Signed)
Prescription refill request for Eliquis received. Indication:tia Last office visit:10/21 [place] Scr:0.92 12/21 Age: 70 Weight:92.6 kg  Prescription refilled

## 2020-09-10 ENCOUNTER — Ambulatory Visit: Payer: Medicare HMO | Admitting: General Practice

## 2020-11-24 ENCOUNTER — Encounter: Payer: Self-pay | Admitting: *Deleted

## 2020-11-24 DIAGNOSIS — Z006 Encounter for examination for normal comparison and control in clinical research program: Secondary | ICD-10-CM

## 2020-12-09 NOTE — Research (Addendum)
11-24-20 spoke with Mrs. Sinopoli and she has been taking the study drug with no problems. She is scheduled to see the cardiologist for her 6 month postop visit on Friday May 6 at the [facility] office. Mrs. Radabaugh wishes to continue the study and will come to our office to get a resupply after she sees the cardiologist.  She reports no CP, AEs, SAEs, Hospitalizations or Urgent Care visits since she was last seen.    [id number] EMPACT Visit 4                                                         Visit Date:  __04-04-2022_________  Visit Type:                 Clinic Visit  []              Phone Visit  [x]                         Web Based Visit   []                      Web + Phone Visit  []    Medical Records Review only  []   Hospitalization Confirmation  Was a hospitalization reported by the patient in the eCOA questionnaire?               NO  [x]      YES   []   Was the hospitalization confirmed in the telephone follow-up contact?       NO  []     YES   []      No hospitalizations reported   Medication Compliance  Study medication being taken according to protocol?    NO  []    YES  [x]   SGLT1 / SGLT2 Inhibitor Treatment  Is the patient currently on active SGLT1/2 - inhibitor (e.g. empagliflozin,     Dapagliflozin, ...) treatment (not to include empagliflozin as study drug)?                  NO  [x]    YES  [] 

## 2020-12-20 NOTE — Progress Notes (Signed)
Cardiology Office Note:    Date:  12/26/2020   ID:  Michaela Paymentancy J Oka, DOB 01/29/1951, MRN [id number]  PCP:  Gwenlyn FoundEksir, Samantha A, MD  Cardiologist:  Olga MillersBrian Crenshaw, MD  Electrophysiologist:  None   Referring MD: Gwenlyn FoundEksir, Samantha A, MD   Chief Complaint: follow-up of CAD and atrial fibrillation  History of Present Illness:    [name] is a 70 y.o. 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 Jens Somrenshaw and presents today for routine follow-up of CAD and atrial fibrillation.  Patient was admitted in 05/2020 with chest pain. High-sensitivity troponin peaked in the 6,000s. Echo showed LVEF of 60-65%. Cardiac cath showed moderate single vessel CAD with 65% stenosis of the mid LAD. FFR and iFR were normal indicating that the lesion was not hemodynamically significant. Medical therapy was recommended. During hospitalization, she was noted to have new onset atrial fibrillation. She was started on Lopressor and ultimately converted back to sinus rhythm. She was started on Eliquis after cardiac cath. Patient was last seen by Dr. Jens Somrenshaw for follow-up on 06/16/2020 at which time she reported dyspnea with more vigorous activities but no chest pain or palpitations.   Since last office visit, she was seen in the ED in 07/2020 for palpitations and was found to be in atrial fibrillation with mildly elevated rates in the low 100's. High-sensitivity troponin was negative x2. She was given a dose of Metoprolol and felt to be stable for discharge but instructed to follow-up with Cardiology.   She presents today for routine follow-up. Patient is in atrial fibrillation today with rates in the 80's. She states she goes in and out of atrial fibrillation and is normally aware when she is in it because  she feels very fatigued. She states she normally only stays in atrial fibrillation for at most a day. However, she thinks she has been in atrial fibrillation all week because she has felt very fatigued and it has shown atrial fibrillation on home BP cuff. No palpitations. No chest pain, shortness of breath, orthopnea, PND, or edema. She notes occasional lightheadedness/dizziness which she thinks is due to drop in blood sugar because it improves when she eats. Also notes feeling "swimmy headed" if she stands too quickly. No near syncope/syncope. She is compliant with her Eliquis and has not missed any doses.  Past Medical History:  Diagnosis Date  . Depression   . Diabetes mellitus without complication (HCC)   . Hyperlipidemia 10/19/2018  . Hypertension   . Hypothyroidism   . Stroke [facility](HCC) 10/19/2018    Past Surgical History:  Procedure Laterality Date  . APPENDECTOMY    . BACK SURGERY    . CHOLECYSTECTOMY    . INTRAVASCULAR PRESSURE WIRE/FFR STUDY N/A 05/26/2020   Procedure: INTRAVASCULAR PRESSURE WIRE/FFR STUDY;  Surgeon: SwazilandJordan, Peter M, MD;  [facility];  Service: Cardiovascular; iFR/FFR of 65% mLAD -  iFR (0.93) and FFR (0.84) = NOT HEMODYNAMICALLY/ PHYSIOLOGICALLY SIGNIFICANT.   Marland Kitchen. LEFT HEART CATH AND CORONARY ANGIOGRAPHY N/A 05/26/2020   Procedure: LEFT HEART CATH AND CORONARY ANGIOGRAPHY;  Surgeon: [place]azilandJordan, Peter M, MD;  [facility];  Service: Cardiovascular; non-STEMI = Mid LAD 65%.  FFR and IFR both negative for hemodynamic significance.  Mildly elevated LVEDP. ->  Recommend medical management.  . TRANSTHORACIC ECHOCARDIOGRAM  05/24/2020   In setting of non-STEMI: EF 60 to 65%.  No R WMA.  "Normal "diastolic parameters.  Normal RV.  Mild MR.  Mild aortic valve sclerosis without stenosis.  (Poor quality)  . VAGINAL HYSTERECTOMY      Current Medications: Current Meds  Medication Sig  . ALPRAZolam (XANAX) 0.5 MG tablet Take 0.5 mg by mouth daily as  needed for anxiety.   . ARTIFICIAL TEAR OP Place 1 drop into both eyes daily as needed (dry eyes).  . citalopram (CELEXA) 20 MG tablet Take 20 mg by mouth daily.  Marland Kitchen ELIQUIS 5 MG TABS tablet TAKE 1 TABLET(5 MG) BY MOUTH TWICE DAILY  . metoprolol tartrate (LOPRESSOR) 25 MG tablet Take 1 tablet (25 mg total) by mouth 2 (two) times daily.  . Multiple Vitamins-Minerals (CENTRUM SILVER PO) Take 1 tablet by mouth daily.  . rosuvastatin (CRESTOR) 40 MG tablet Take 1 tablet (40 mg total) by mouth at bedtime.  . Study - EMPACT-MI - empagliflozin (JARDIANCE) 10 mg or placebo tablet (PI-Stuckey) Take 1 tablet by mouth daily.  Marland Kitchen SYNTHROID 100 MCG tablet Take 100 mcg by mouth daily before breakfast.      Allergies:   Tape   Social History   Socioeconomic History  . Marital status: Married    Spouse name: Not on file  . Number of children: Not on file  . Years of education: Not on file  . Highest education level: Not on file  Occupational History  . Not on file  Tobacco Use  . Smoking status: Never Smoker  . Smokeless tobacco: Never Used  Substance and Sexual Activity  . Alcohol use: No  . Drug use: No  . Sexual activity: Not on file  Other Topics Concern  . Not on file  Social History Narrative  . Not on file   Social Determinants of Health   Financial Resource Strain: Not on file  Food Insecurity: Not on file  Transportation Needs: Not on file  Physical Activity: Not on file  Stress: Not on file  Social Connections: Not on file     Family History: The patient's family history includes Arrhythmia in her brother; Breast cancer in her sister; Colon cancer in her father; Coronary artery disease in her mother; Diabetes in her mother; Throat cancer in her brother.  ROS:   Please see the history of present illness.     EKGs/Labs/Other Studies Reviewed:    The following studies were reviewed today:  Echocardiogram 05/24/2020: Impressions: 1. Left ventricular ejection fraction, by  estimation, is 60 to 65%. The  left ventricle has normal function. Left ventricular endocardial border  not optimally defined to evaluate regional wall motion. Left ventricular  diastolic parameters were normal.  2. Right ventricular systolic function is normal. The right ventricular  size is normal. Tricuspid regurgitation signal is inadequate for assessing  PA pressure.  3. The mitral valve is normal in structure. No evidence of mitral valve  regurgitation. No evidence of mitral stenosis.  4. The aortic valve is tricuspid. Aortic valve regurgitation is not  visualized. Mild aortic valve sclerosis is present, with no evidence of  aortic valve stenosis.  5. The inferior vena cava is normal in size with greater than 50%  respiratory variability, suggesting right atrial pressure of 3 mmHg.  6. Recommend limited study with definity to delineate endocardial borders  for assessment of wall motion.  _______________  Left Cardiac Catheterization 05/26/2020:  Mid LAD lesion is 65% stenosed.  LV end  diastolic pressure is mildly elevated.   1. Borderline focal stenosis in the mid LAD. FFR and iFR are normal indicating that the lesion is not hemodynamically significant. 2. Mildly elevated LVEDP  Plan: medical management.   Diagnostic Dominance: Right       EKG:  EKG ordered today. EKG personally reviewed and demonstrates atrial fibrillation, rate 82 bpm, with no acute ST/T changes. Left axis deviation. QTc 441 ms.  Recent Labs: 05/26/2020: ALT 15; Magnesium 2.0; TSH 9.920 08/02/2020: BUN 18; Creatinine, Ser 0.92; Hemoglobin 13.7; Platelets 336; Potassium 4.4; Sodium 141  Recent Lipid Panel    Component Value Date/Time   CHOL 115 05/24/2020 0343   TRIG 102 05/24/2020 0343   HDL 39 (L) 05/24/2020 0343   CHOLHDL 2.9 05/24/2020 0343   VLDL 20 05/24/2020 0343   LDLCALC 56 05/24/2020 0343    Physical Exam:    Vital Signs: BP (!) 123/92   Pulse 82   Ht 5\' 5"  (1.651 m)   Wt  195 lb (88.5 kg)   SpO2 94%   BMI 32.45 kg/m     Wt Readings from Last 3 Encounters:  12/26/20 195 lb (88.5 kg)  08/02/20 204 lb 3.4 oz (92.6 kg)  06/16/20 204 lb 3.2 oz (92.6 kg)     General: 70 y.o. female in no acute distress. HEENT: Normocephalic and atraumatic. Sclera clear.  Neck: Supple. No carotid bruits. No JVD. Heart: Irregularly irregular rhythm with normal rate. Distinct S1 and S2. No murmurs, gallops, or rubs. Radial pulses 2+ and equal bilaterally. Lungs: No increased work of breathing. Clear to ausculation bilaterally. No wheezes, rhonchi, or rales.  Abdomen: Soft, non-distended, and non-tender to palpation.  Extremities: No lower extremity edema.    Skin: Warm and dry. Neuro: Alert and oriented x3. No focal deficits. Psych: Normal affect. Responds appropriately.  Assessment:    1. Coronary artery disease involving native coronary artery of native heart without angina pectoris   2. Paroxysmal atrial fibrillation (HCC)   3. Primary hypertension   4. Hyperlipidemia, unspecified hyperlipidemia type   5. Type 2 diabetes mellitus with complication, without long-term current use of insulin (HCC)     Plan:    Non-Obstructive CAD - Cardiac cath in 05/2020 showed moderate non-obstructive single vessel CAD involving the LAD. Medical therapy recommended. - No angina.  - Not on aspirin due to need for Eliquis.  - Continue beta-blocker and high-sensitivity statin.   Paroxysmal Atrial Fibrillation - In rate controlled atrial fibrillation today. Patient states she goes in and out of this and is normally aware when she is in atrial fibrillation because she feels very fatigued. She thinks she has been in atrial fibrillation for the last week. - Continue Lopressor 25mg  twice daily.  - Continue chronic anticoagulation with Eliquis 5mg  twice daily.  - Will check BMET and Magnesium today. PCP recently checked TSH so will asked that this be faxed to 06/2020. - Will order 2 week Zio  monitor to assess atrial fibrillation burden. Offered DCCV but patient declined (she states she has a family member who died after DCCV). Depending on what monitor shows, may benefit from antiarrhythmic since she does not wish to pursue DCCV.  Hypertension - Diastolic BP mildly elevated today. BP 123/92. - Continue Lopressor as above. - Advised patient to keep BP/HR log for 2 weeks and then send this to .  Hyperlipidemia - Lipid panel in 08/2020: Total Cholesterol 91, Triglycerides 119, HDL 37, LDL 54.  - LDL goal <70 given CAD. -  Continue Crestor  daily.   Type 2 Diabetes Mellitus - Patient is currently part of the EMPACT-MI study.  Disposition: Follow up in 1 month after Monitor.   Medication Adjustments/Labs and Tests Ordered: Current medicines are reviewed at length with the patient today.  Concerns regarding medicines are outlined above.  Orders Placed This Encounter  Procedures  . Basic metabolic panel  . Magnesium  . LONG TERM MONITOR (3-14 DAYS)  . EKG 12-Lead   No orders of the defined types were placed in this encounter.   Patient Instructions  Medication Instructions:  Your physician recommends that you continue on your current medications as directed. Please refer to the Current Medication list given to you today.  *If you need a refill on your cardiac medications before your next appointment, please call your pharmacy*   Lab Work: TODAY: BMET, MAG  If you have labs (blood work) drawn today and your tests are completely normal, you will receive your results only by: Marland Kitchen MyChart Message (if you have MyChart) OR . A paper copy in the mail If you have any lab test that is abnormal or we need to change your treatment, we will call you to review the results.   Follow-Up: At [facility], you and your health needs are our priority.  As part of our continuing mission to provide you with exceptional heart care, we have created designated Provider Care Teams.   These Care Teams include your primary Cardiologist (physician) and Advanced Practice Providers (APPs -  Physician Assistants and Nurse Practitioners) who all work together to provide you with the care you need, when you need it.  We recommend signing up for the patient portal called "MyChart".  Sign up information is provided on this After Visit Summary.  MyChart is used to connect with patients for Virtual Visits (Telemedicine).  Patients are able to view lab/test results, encounter notes, upcoming appointments, etc.  Non-urgent messages can be sent to your provider as well.   To learn more about what you can do with MyChart, go to [email or web address].    Your next appointment:   1 month(s)  The format for your next appointment:   In Person  Provider:   You may see Olga Millers, MD or one of the following Advanced Practice Providers on your designated Care Team:    Marjie Skiff, PA-C   Other Instructions Keep a Blood Pressure and Heart Rate log for 2 weeks and call us with those readings.  ZIO XT- Long Term Monitor Instructions   Your physician has requested you wear a ZIO patch monitor for _14__ days.  This is a single patch monitor.   [employer] supplies one patch monitor per enrollment. Additional stickers are not available. Please do not apply patch if you will be having a Nuclear Stress Test, Echocardiogram, Cardiac CT, MRI, or Chest Xray during the period you would be wearing the monitor. The patch cannot be worn during these tests. You cannot remove and re-apply the ZIO XT patch monitor.  Your ZIO patch monitor will be sent Fed Ex from [employer] directly to your home address. It may take 3-5 days to receive your monitor after you have been enrolled.  Once you have received your monitor, please review the enclosed instructions. Your monitor has already been registered assigning a specific monitor serial # to you.  Billing and Patient Assistance Program Information    We have supplied [employer] with any of your insurance information on file for billing purposes. [employer]  offers a sliding scale Patient Assistance Program for patients that do not have insurance, or whose insurance does not completely cover the cost of the ZIO monitor.   You must apply for the Patient Assistance Program to qualify for this discounted rate.     To apply, please call [employer] at [phone], select option 4, then select option 2, and ask to apply for Patient Assistance Program.  Meredeth Ide will ask your household income, and how many people are in your household.  They will quote your out-of-pocket cost based on that information.  [employer] will also be able to set up a 10-month, interest-free payment plan if needed.  Applying the monitor   Shave hair from upper left chest.  Hold abrader disc by orange tab. Rub abrader in 40 strokes over the upper left chest as indicated in your monitor instructions.  Clean area with 4 enclosed alcohol pads. Let dry.  Apply patch as indicated in monitor instructions. Patch will be placed under collarbone on left side of chest with arrow pointing upward.  Rub patch adhesive wings for 2 minutes. Remove white label marked "1". Remove the white label marked "2". Rub patch adhesive wings for 2 additional minutes.  While looking in a mirror, press and release button in center of patch. A small green light will flash 3-4 times. This will be your only indicator that the monitor has been turned on. ?  Do not shower for the first 24 hours. You may shower after the first 24 hours.  Press the button if you feel a symptom. You will hear a small click. Record Date, Time and Symptom in the Patient Logbook.  When you are ready to remove the patch, follow instructions on the last 2 pages of the Patient Logbook. Stick patch monitor onto the last page of Patient Logbook.  Place Patient Logbook in the blue and white box.  Use locking tab on box and tape box closed securely.   The blue and white box has prepaid postage on it. Please place it in the mailbox as soon as possible. Your physician should have your test results approximately 7 days after the monitor has been mailed back to [facility].  Call [facility] Customer Care at [phone] if you have questions regarding your ZIO XT patch monitor. Call them immediately if you see an orange light blinking on your monitor.  If your monitor falls off in less than 4 days, contact our Monitor department at [phone]. ?If your monitor becomes loose or falls off after 4 days call [employer] at [phone] for suggestions on securing your monitor.?      Leanne Lovely, PA-C  12/26/2020 10:32 AM    [facility]

## 2020-12-26 ENCOUNTER — Encounter: Payer: Self-pay | Admitting: Student

## 2020-12-26 ENCOUNTER — Ambulatory Visit: Payer: Medicare HMO | Admitting: Student

## 2020-12-26 ENCOUNTER — Other Ambulatory Visit: Payer: Self-pay

## 2020-12-26 ENCOUNTER — Encounter: Payer: Medicare HMO | Admitting: *Deleted

## 2020-12-26 ENCOUNTER — Ambulatory Visit (INDEPENDENT_AMBULATORY_CARE_PROVIDER_SITE_OTHER): Payer: Medicare HMO

## 2020-12-26 VITALS — BP 123/92 | HR 82 | Ht 65.0 in | Wt 195.0 lb

## 2020-12-26 DIAGNOSIS — I2510 Atherosclerotic heart disease of native coronary artery without angina pectoris: Secondary | ICD-10-CM

## 2020-12-26 DIAGNOSIS — I480 Paroxysmal atrial fibrillation: Secondary | ICD-10-CM

## 2020-12-26 DIAGNOSIS — E785 Hyperlipidemia, unspecified: Secondary | ICD-10-CM

## 2020-12-26 DIAGNOSIS — I10 Essential (primary) hypertension: Secondary | ICD-10-CM | POA: Diagnosis not present

## 2020-12-26 DIAGNOSIS — E118 Type 2 diabetes mellitus with unspecified complications: Secondary | ICD-10-CM

## 2020-12-26 DIAGNOSIS — Z006 Encounter for examination for normal comparison and control in clinical research program: Secondary | ICD-10-CM

## 2020-12-26 LAB — BASIC METABOLIC PANEL
BUN/Creatinine Ratio: 24 (ref 12–28)
BUN: 23 mg/dL (ref 8–27)
CO2: 23 mmol/L (ref 20–29)
Calcium: 9.7 mg/dL (ref 8.7–10.3)
Chloride: 102 mmol/L (ref 96–106)
Creatinine, Ser: 0.94 mg/dL (ref 0.57–1.00)
Glucose: 92 mg/dL (ref 65–99)
Potassium: 4.4 mmol/L (ref 3.5–5.2)
Sodium: 140 mmol/L (ref 134–144)
eGFR: 66 mL/min/{1.73_m2} (ref >59)

## 2020-12-26 LAB — MAGNESIUM: Magnesium: 2.1 mg/dL (ref 1.6–2.3)

## 2020-12-26 NOTE — Patient Instructions (Signed)
Medication Instructions:  Your physician recommends that you continue on your current medications as directed. Please refer to the Current Medication list given to you today.  *If you need a refill on your cardiac medications before your next appointment, please call your pharmacy*   Lab Work: TODAY: BMET, MAG  If you have labs (blood work) drawn today and your tests are completely normal, you will receive your results only by: Marland Kitchen MyChart Message (if you have MyChart) OR . A paper copy in the mail If you have any lab test that is abnormal or we need to change your treatment, we will call you to review the results.   Follow-Up: At [facility], you and your health needs are our priority.  As part of our continuing mission to provide you with exceptional heart care, we have created designated Provider Care Teams.  These Care Teams include your primary Cardiologist (physician) and Advanced Practice Providers (APPs -  Physician Assistants and Nurse Practitioners) who all work together to provide you with the care you need, when you need it.  We recommend signing up for the patient portal called "MyChart".  Sign up information is provided on this After Visit Summary.  MyChart is used to connect with patients for Virtual Visits (Telemedicine).  Patients are able to view lab/test results, encounter notes, upcoming appointments, etc.  Non-urgent messages can be sent to your provider as well.   To learn more about what you can do with MyChart, go to [email or web address].    Your next appointment:   1 month(s)  The format for your next appointment:   In Person  Provider:   You may see Olga Millers, MD or one of the following Advanced Practice Providers on your designated Care Team:    Marjie Skiff, PA-C   Other Instructions Keep a Blood Pressure and Heart Rate log for 2 weeks and call us with those readings.  ZIO XT- Long Term Monitor Instructions   Your physician has requested  you wear a ZIO patch monitor for _14__ days.  This is a single patch monitor.   [employer] supplies one patch monitor per enrollment. Additional stickers are not available. Please do not apply patch if you will be having a Nuclear Stress Test, Echocardiogram, Cardiac CT, MRI, or Chest Xray during the period you would be wearing the monitor. The patch cannot be worn during these tests. You cannot remove and re-apply the ZIO XT patch monitor.  Your ZIO patch monitor will be sent Fed Ex from [employer] directly to your home address. It may take 3-5 days to receive your monitor after you have been enrolled.  Once you have received your monitor, please review the enclosed instructions. Your monitor has already been registered assigning a specific monitor serial # to you.  Billing and Patient Assistance Program Information   We have supplied [employer] with any of your insurance information on file for billing purposes. [employer] offers a sliding scale Patient Assistance Program for patients that do not have insurance, or whose insurance does not completely cover the cost of the ZIO monitor.   You must apply for the Patient Assistance Program to qualify for this discounted rate.     To apply, please call [employer] at [phone], select option 4, then select option 2, and ask to apply for Patient Assistance Program.  Meredeth Ide will ask your household income, and how many people are in your household.  They will quote your out-of-pocket cost based on that information.  [employer]  will also be able to set up a 55-month, interest-free payment plan if needed.  Applying the monitor   Shave hair from upper left chest.  Hold abrader disc by orange tab. Rub abrader in 40 strokes over the upper left chest as indicated in your monitor instructions.  Clean area with 4 enclosed alcohol pads. Let dry.  Apply patch as indicated in monitor instructions. Patch will be placed under collarbone on left side of chest with arrow  pointing upward.  Rub patch adhesive wings for 2 minutes. Remove white label marked "1". Remove the white label marked "2". Rub patch adhesive wings for 2 additional minutes.  While looking in a mirror, press and release button in center of patch. A small green light will flash 3-4 times. This will be your only indicator that the monitor has been turned on. ?  Do not shower for the first 24 hours. You may shower after the first 24 hours.  Press the button if you feel a symptom. You will hear a small click. Record Date, Time and Symptom in the Patient Logbook.  When you are ready to remove the patch, follow instructions on the last 2 pages of the Patient Logbook. Stick patch monitor onto the last page of Patient Logbook.  Place Patient Logbook in the blue and white box.  Use locking tab on box and tape box closed securely.  The blue and white box has prepaid postage on it. Please place it in the mailbox as soon as possible. Your physician should have your test results approximately 7 days after the monitor has been mailed back to [facility].  Call [facility] Customer Care at [phone] if you have questions regarding your ZIO XT patch monitor. Call them immediately if you see an orange light blinking on your monitor.  If your monitor falls off in less than 4 days, contact our Monitor department at [phone]. ?If your monitor becomes loose or falls off after 4 days call [employer] at [phone] for suggestions on securing your monitor.?

## 2020-12-26 NOTE — Progress Notes (Unsigned)
Patient enrolled for [employer] to ship a 14 day ZIO XT long term holter monitor to her home. 

## 2020-12-26 NOTE — Research (Addendum)
Visit Date:  _5-6-2022_____   Patient was seen in our Research office following her appointment with Cardiology this morning. She is having issues with her A.Fib and they are making arrangements for her to wear a ZIO patch for evaluation. She tells me that she had a family member die after having a DCCV and therefore she is not wanting to have this done. I reconciled her medications today, She did not bring her used Kit with her today.  Mr. Ciaramitaro was randomized for her replacement EMPACT-MI study drug and was assigned Kit# [id number]. She voiced understanding of how to take this medication and will contact me if she has any questions or concerns in the future.   Hospitalization Confirmation  Was a hospitalization reported by the patient in the [place] questionnaire?               NO  [x]      YES   []   Was the hospitalization confirmed in the telephone follow-up contact?       NO  []     YES   []        Clinic visit, no hospitalization, Urgent Care visits, AE, SAEs reported.   Medication Compliance  Study medication being taken according to protocol?    NO  []    YES  [x]   She does report having missed a dose occasionally but would average taking 6 out of 7 days per week  SGLT1 / SGLT2 Inhibitor Treatment  Is the patient currently on active SGLT1/2 - inhibitor (e.g. empagliflozin,     Dapagliflozin, ...) treatment (not to include empagliflozin as study drug)?                  NO  [x]    YES  []    Current Outpatient Medications:    ALPRAZolam (XANAX) 0.5 MG tablet, Take 0.5 mg by mouth daily as needed for anxiety. , Disp: , Rfl:    ARTIFICIAL TEAR OP, Place 1 drop into both eyes daily as needed (dry eyes)., Disp: , Rfl:    citalopram (CELEXA) 20 MG tablet, Take 20 mg by mouth daily., Disp: , Rfl:    ELIQUIS 5 MG TABS tablet, TAKE 1 TABLET(5 MG) BY MOUTH TWICE DAILY, Disp: 60 tablet, Rfl: 5   metoprolol tartrate (LOPRESSOR) 25 MG tablet, Take 1 tablet (25 mg total) by mouth 2 (two) times daily.,  Disp: 60 tablet, Rfl: 1   Multiple Vitamins-Minerals (CENTRUM SILVER PO), Take 1 tablet by mouth daily., Disp: , Rfl:    rosuvastatin (CRESTOR) 40 MG tablet, Take 1 tablet (40 mg total) by mouth at bedtime., Disp: 30 tablet, Rfl: 1   Study - EMPACT-MI - empagliflozin (JARDIANCE) 10 mg or placebo tablet (PI-Stuckey), Take 1 tablet by mouth daily., Disp: , Rfl:    SYNTHROID 100 MCG tablet, Take 100 mcg by mouth daily before breakfast. , Disp: , Rfl:

## 2020-12-30 DIAGNOSIS — I480 Paroxysmal atrial fibrillation: Secondary | ICD-10-CM

## 2020-12-30 IMAGING — DX DG CHEST 1V
1 series · 1 of 1 positions shown · non-contrast
Comparison: 05/23/2020

CLINICAL DATA: Palpitations, shortness of breath

EXAM:
CHEST  1 VIEW

[chest]
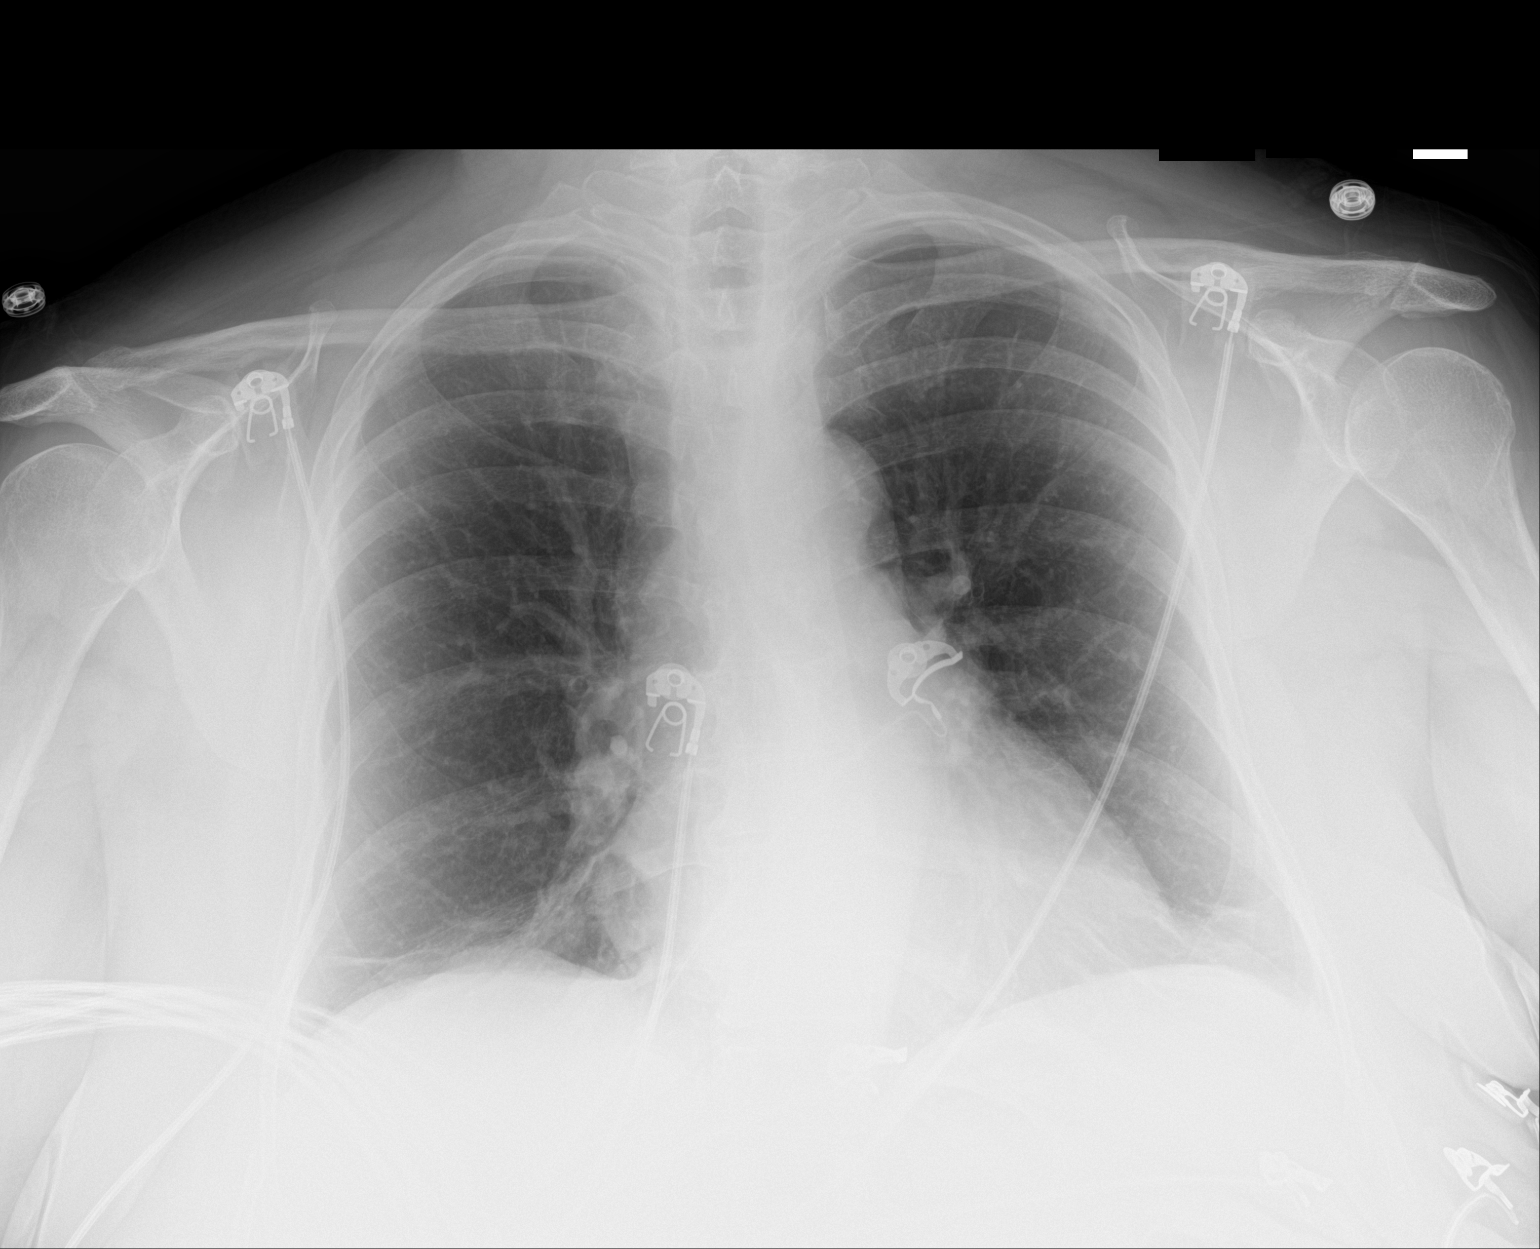

[1 of 1 positions shown; findings below may reference images not displayed]

FINDINGS: Bibasilar scarring. Heart is normal size. No acute confluent
opacities or effusions. No acute bony abnormality.
IMPRESSION: Bibasilar scarring.  No active disease.

## 2021-02-01 NOTE — Progress Notes (Signed)
Cardiology Clinic Note   Patient Name: [name] Date of Encounter: 02/03/2021  Primary Care Provider:  Gwenlyn FoundEksir, Samantha A, MD Primary Cardiologist:  Olga MillersBrian Crenshaw, MD  Patient Profile    [name] 70 year old female presents the clinic today for follow-up evaluation of her coronary artery disease.  Past Medical History    Past Medical History:  Diagnosis Date   Depression    Diabetes mellitus without complication (HCC)    Hyperlipidemia 10/19/2018   Hypertension    Hypothyroidism    Stroke (HCC) 10/19/2018   Past Surgical History:  Procedure Laterality Date   APPENDECTOMY     BACK SURGERY     CHOLECYSTECTOMY     INTRAVASCULAR PRESSURE WIRE/FFR STUDY N/A 05/26/2020   Procedure: INTRAVASCULAR PRESSURE WIRE/FFR STUDY;  Surgeon: SwazilandJordan, Peter M, MD;  Location: [facility] INVASIVE CV LAB;  Service: Cardiovascular; iFR/FFR of 65% mLAD -  iFR (0.93) and FFR (0.84) = NOT HEMODYNAMICALLY/ PHYSIOLOGICALLY SIGNIFICANT.    LEFT HEART CATH AND CORONARY ANGIOGRAPHY N/A 05/26/2020   Procedure: LEFT HEART CATH AND CORONARY ANGIOGRAPHY;  Surgeon: [place]azilandJordan, Peter M, MD;  [facility];  Service: Cardiovascular; non-STEMI = Mid LAD 65%.  FFR and IFR both negative for hemodynamic significance.  Mildly elevated LVEDP. ->  Recommend medical management.   TRANSTHORACIC ECHOCARDIOGRAM  05/24/2020   In setting of non-STEMI: EF 60 to 65%.  No R WMA.  "Normal "diastolic parameters.  Normal RV.  Mild MR.  Mild aortic valve sclerosis without stenosis.  (Poor quality)   VAGINAL HYSTERECTOMY      Allergies  Allergies  Allergen Reactions   Tape Rash    Can only tolerate LIMITED exposure; no "plastic tape," please    History of Present Illness    [name] has a PMH of single-vessel CAD involving her LAD.  She underwent cardiac catheterization 10/21, paroxysmal atrial fibrillation on Eliquis, HTN, prior CVA 2020, hyperlipidemia, type 2 diabetes, and hypothyroidism.  She was  admitted to the hospital 10/21 with chest pain.  Her high-sensitivity troponins climbed to over 6000.  Her echocardiogram showed an LVEF of 60 to 65%.  She underwent cardiac catheterization which showed moderate single-vessel coronary artery disease with 65% stenosis of her mid LAD.  FFR and IFR were normal indicating a nonsignificant lesion.  She was noted to have new onset atrial fibrillation during her hospitalization.  She was started on metoprolol and converted to sinus rhythm.  She was started on apixaban after her cardiac catheterization.  She was seen by Dr. Jens Somrenshaw 06/16/2020.  During that time she reported dyspnea with more vigorous activity.  She denied chest pain or palpitations.  She was seen in the emergency department 12/21 for palpitations and was found to be in atrial fibrillation with mildly elevated heart rate in the 100s.  Her high-sensitivity troponins were negative x2.  She was given a dose of metoprolol and felt to be stable for discharge.  She was instructed to follow-up with cardiology.  She was seen by Marjie Skiffallie Goodrich, PA-C on 12/26/2020.  During that time she was in atrial fibrillation with a rate in the 80s.  She reported that she would go in and out of atrial fibrillation.  She reported that she could tell when she was in atrial fibrillation because it made her very fatigued.  She had noticed that she would stay in atrial fibrillation for at least 1 day and then convert back to sinus rhythm.  However, she felt that she  had been in atrial fibrillation all week due to her state of fatigue and her home blood pressure cuff.  She denied palpitations, chest pain, shortness of breath, orthopnea, PND, and lower extremity edema.  She did have occasional lightheadedness/dizziness.  She felt this was related to drops in her blood sugar.  She denied presyncope/syncope.  She reported compliance with her apixaban.  She was offered DCCV but wished to defer procedure at this time.  A cardiac event  monitor is ordered.  It showed a minimum heart rate of 46, maximal heart rate of 153, average heart rate of 66 bpm.  Her predominant underlying rhythm was sinus rhythm.  Atrial fibrillation occurred 14% of the time and ranged from 52-153 with her longest episode lasting 1 day 9 hours.  Continued medical therapy was recommended.  She presents the clinic today for follow-up evaluation states she feels well.  We reviewed her cardiac event monitor and discussed her anticoagulation.  She reported compliance with her apixaban and denies bleeding issues.  She reported that her brother who also has atrial fibrillation had a stroke this morning.  She described as minor and reports that he refused to go to the hospital.  She has identified a pattern with her atrial fibrillation.  When she has increased stress she will have episodes of atrial fibrillation and does not appear to have other triggers.  She reports that her granddaughter is recently moved back into her house which at times can increase her stress level.  We discussed coping strategies.  I will give her the mindfulness stress reduction sheet, have her maintain her physical activity, give her the salty 6 diet sheet, follow-up in 6 months.  Today she denies chest pain, shortness of breath, lower extremity edema, fatigue, palpitations, melena, hematuria, hemoptysis, diaphoresis, weakness, presyncope, syncope, orthopnea, and PND.   Home Medications    Prior to Admission medications   Medication Sig Start Date End Date Taking? Authorizing Provider  ALPRAZolam Prudy Feeler) 0.5 MG tablet Take 0.5 mg by mouth daily as needed for anxiety.  03/20/18   [provider]  ARTIFICIAL TEAR OP Place 1 drop into both eyes daily as needed (dry eyes).    [provider]  citalopram (CELEXA) 20 MG tablet Take 20 mg by mouth daily.    [provider]  ELIQUIS 5 MG TABS tablet TAKE 1 TABLET(5 MG) BY MOUTH TWICE DAILY 09/05/20   Marjie Skiff E, PA-C   metoprolol tartrate (LOPRESSOR) 25 MG tablet Take 1 tablet (25 mg total) by mouth 2 (two) times daily. 05/26/20   Burnadette Pop, MD  Multiple Vitamins-Minerals (CENTRUM SILVER PO) Take 1 tablet by mouth daily.    [provider]  rosuvastatin (CRESTOR) 40 MG tablet Take 1 tablet (40 mg total) by mouth at bedtime. 05/26/20   Burnadette Pop, MD  Study - EMPACT-MI - empagliflozin (JARDIANCE) 10 mg or placebo tablet (PI-Stuckey) Take 1 tablet by mouth daily.    Jodelle Red, MD  SYNTHROID 100 MCG tablet Take 100 mcg by mouth daily before breakfast.  10/10/18   [provider]    Family History    Family History  Problem Relation Age of Onset   Colon cancer Father        died at 26   Throat cancer Brother    Breast cancer Sister        sister died at 3   Coronary artery disease Mother        stent age 75, died at  age 24 of complications from diabetes/heart disease   Diabetes Mother    Arrhythmia Brother    She indicated that her mother is deceased. She indicated that her father is deceased. She indicated that the status of her sister is unknown.  Social History    Social History   Socioeconomic History   Marital status: Married    Spouse name: Not on file   Number of children: Not on file   Years of education: Not on file   Highest education level: Not on file  Occupational History   Not on file  Tobacco Use   Smoking status: Never   Smokeless tobacco: Never  Substance and Sexual Activity   Alcohol use: No   Drug use: No   Sexual activity: Not on file  Other Topics Concern   Not on file  Social History Narrative   Not on file   Social Determinants of Health   Financial Resource Strain: Not on file  Food Insecurity: Not on file  Transportation Needs: Not on file  Physical Activity: Not on file  Stress: Not on file  Social Connections: Not on file  Intimate Partner Violence: Not on file     Review of Systems    General:  No chills,  fever, night sweats or weight changes.  Cardiovascular:  No chest pain, dyspnea on exertion, edema, orthopnea, palpitations, paroxysmal nocturnal dyspnea. Dermatological: No rash, lesions/masses Respiratory: No cough, dyspnea Urologic: No hematuria, dysuria Abdominal:   No nausea, vomiting, diarrhea, bright red blood per rectum, melena, or hematemesis Neurologic:  No visual changes, wkns, changes in mental status. All other systems reviewed and are otherwise negative except as noted above.  Physical Exam    VS:  BP 120/74   Pulse 63   Ht 5\' 5"  (1.651 m)   Wt 192 lb 6.4 oz (87.3 kg)   SpO2 96%   BMI 32.02 kg/m  , BMI Body mass index is 32.02 kg/m. GEN: Well nourished, well developed, in no acute distress. HEENT: normal. Neck: Supple, no JVD, carotid bruits, or masses. Cardiac: RRR, no murmurs, rubs, or gallops. No clubbing, cyanosis, edema.  Radials/DP/PT 2+ and equal bilaterally.  Respiratory:  Respirations regular and unlabored, clear to auscultation bilaterally. GI: Soft, nontender, nondistended, BS + x 4. MS: no deformity or atrophy. Skin: warm and dry, no rash. Neuro:  Strength and sensation are intact. Psych: Normal affect.  Accessory Clinical Findings    Recent Labs: 05/26/2020: ALT 15; TSH 9.920 08/02/2020: Hemoglobin 13.7; Platelets 336 12/26/2020: BUN 23; Creatinine, Ser 0.94; Magnesium 2.1; Potassium 4.4; Sodium 140   Recent Lipid Panel    Component Value Date/Time   CHOL 115 05/24/2020 0343   TRIG 102 05/24/2020 0343   HDL 39 (L) 05/24/2020 0343   CHOLHDL 2.9 05/24/2020 0343   VLDL 20 05/24/2020 0343   LDLCALC 56 05/24/2020 0343    ECG personally reviewed by me today- none today.   Cardiac catheterization 05/26/2020 Mid LAD lesion is 65% stenosed. LV end diastolic pressure is mildly elevated.   1. Borderline focal stenosis in the mid LAD. FFR and iFR are normal indicating that the lesion is not hemodynamically significant. 2. Mildly elevated LVEDP    Plan: medical management. Diagnostic Dominance: Right    Intervention    Cardiac event monitor 01/15/2021 Patch Wear Time:  13 days and 18 hours (2022-05-10T14:03:06-399 to 2022-05-24T08:19:37-0400)   Patient had a min HR of 46 bpm, max HR of 153 bpm, and avg HR of 66 bpm.  Predominant underlying rhythm was Sinus Rhythm. Atrial Fibrillation occurred (14% burden), ranging from 52-153 bpm (avg of 90 bpm), the longest lasting 1 day 9 hours with an avg rate of 89 bpm. Isolated SVEs were rare (<1.0%), SVE Couplets were rare (<1.0%), and SVE Triplets were rare (<1.0%). Isolated VEs were rare (<1.0%), VE Couplets were rare (<1.0%), and no VE Triplets were present.   Sinus bradycardia, normal sinus rhythm, PACs, PVCs and intermittent paroxysmal atrial fibrillation. Olga Millers  Assessment & Plan   1.  Paroxysmal atrial fibrillation-heart rate today 63 .  Rate controlled.  Wore cardiac event monitor which showed PAF.  Medical management recommended.  Details above. Continue apixaban, metoprolol Heart healthy low-sodium diet-salty 6 given Increase physical activity as tolerated  Coronary artery disease-no chest pain today.  Underwent cardiac catheterization 10/21 which showed nonobstructive CAD involving her LAD.  Medical management recommended No aspirin on Eliquis Continue metoprolol, rosuvastatin  Essential hypertension-BP today 120/74.  Well-controlled at home. Continue metoprolol Heart healthy low-sodium diet-salty 6 given Increase physical activity as tolerated  Hyperlipidemia-05/24/2020: Cholesterol 115; HDL 39; LDL Cholesterol 56; Triglycerides 102; VLDL 20 Continue rosuvastatin Heart healthy low-sodium high-fiber diet Increase physical activity as tolerated  Type 2 diabetes-continues with participation in Empact-MI study.  Disposition: Follow-up with Dr. Jens Som in 4 to 6 months.  Thomasene Ripple. Marsden Zaino NP-C    02/03/2021, 11:43 AM [facility] [address] Office [phone] Fax [phone]  Notice: This dictation was prepared with Dragon dictation along with smaller phrase technology. Any transcriptional errors that result from this process are unintentional and may not be corrected upon review.  I spent 15 minutes examining this patient, reviewing medications, and using patient centered shared decision making involving her cardiac care.  Prior to her visit I spent greater than 20 minutes reviewing her past medical history,  medications, and prior cardiac tests.

## 2021-02-03 ENCOUNTER — Ambulatory Visit (INDEPENDENT_AMBULATORY_CARE_PROVIDER_SITE_OTHER): Payer: Medicare HMO | Admitting: General Practice

## 2021-02-03 ENCOUNTER — Other Ambulatory Visit: Payer: Self-pay

## 2021-02-03 ENCOUNTER — Encounter: Payer: Self-pay | Admitting: General Practice

## 2021-02-03 VITALS — BP 120/74 | HR 63 | Ht 65.0 in | Wt 192.4 lb

## 2021-02-03 DIAGNOSIS — E785 Hyperlipidemia, unspecified: Secondary | ICD-10-CM | POA: Diagnosis not present

## 2021-02-03 DIAGNOSIS — I480 Paroxysmal atrial fibrillation: Secondary | ICD-10-CM | POA: Diagnosis not present

## 2021-02-03 DIAGNOSIS — E118 Type 2 diabetes mellitus with unspecified complications: Secondary | ICD-10-CM

## 2021-02-03 DIAGNOSIS — I2510 Atherosclerotic heart disease of native coronary artery without angina pectoris: Secondary | ICD-10-CM

## 2021-02-03 DIAGNOSIS — I10 Essential (primary) hypertension: Secondary | ICD-10-CM

## 2021-02-03 NOTE — Patient Instructions (Signed)
Medication Instructions:  The current medical regimen is effective;  continue present plan and medications as directed. Please refer to the Current Medication list given to you today. *If you need a refill on your cardiac medications before your next appointment, please call your pharmacy*  Lab Work:   Testing/Procedures:  NONE    NONE  Special Instructions PLEASE READ SLEEP TIPS-ATTACHED  PLEASE READ AND FOLLOW SALTY 6-ATTACHED-1,800mg  daily  PLEASE MAINTAIN PHYSICAL ACTIVITY AS TOLERATED  Follow-Up: Your next appointment:  6 month(s) In Person with Olga Millers, MD ONLY  At [facility], you and your health needs are our priority.  As part of our continuing mission to provide you with exceptional heart care, we have created designated Provider Care Teams.  These Care Teams include your primary Cardiologist (physician) and Advanced Practice Providers (APPs -  Physician Assistants and Nurse Practitioners) who all work together to provide you with the care you need, when you need it.  We recommend signing up for the patient portal called "MyChart".  Sign up information is provided on this After Visit Summary.  MyChart is used to connect with patients for Virtual Visits (Telemedicine).  Patients are able to view lab/test results, encounter notes, upcoming appointments, etc.  Non-urgent messages can be sent to your provider as well.   To learn more about what you can do with MyChart, go to [email or web address].              6 SALTY THINGS TO AVOID     1,800MG  DAILY   Quality Sleep Information, Adult Quality sleep is important for your mental and physical health. It also improves your quality of life. Quality sleep means you: Are asleep for most of the time you are in bed. Fall asleep within 30 minutes. Wake up no more than once a night.  Are awake for no longer than 20 minutes if you do wake up during the night. Most adults need 7-8 hours of quality sleep each night. How can  poor sleep affect me? If you do not get enough quality sleep, you may have: Mood swings. Daytime sleepiness. Confusion. Decreased reaction time. Sleep disorders, such as insomnia and sleep apnea. Difficulty with: Solving problems. Coping with stress. Paying attention. These issues may affect your performance and productivity at work, school, and at home. Lack of sleep may also put you at higher risk for accidents, suicide,and risky behaviors. If you do not get quality sleep you may also be at higher risk for several health problems, including: Infections. Type 2 diabetes. Heart disease. High blood pressure. Obesity. Worsening of long-term conditions, like arthritis, kidney disease, depression, Parkinson's disease, and epilepsy. What actions can I take to get more quality sleep?     Stick to a sleep schedule. Go to sleep and wake up at about the same time each day. Do not try to sleep less on weekdays and make up for lost sleep on weekends. This does not work. Try to get about 30 minutes of exercise on most days. Do not exercise 2-3 hours before going to bed. Limit naps during the day to 30 minutes or less. Do not use any products that contain nicotine or tobacco, such as cigarettes or e-cigarettes. If you need help quitting, ask your health care provider. Do not drink caffeinated beverages for at least 8 hours before going to bed. Coffee, tea, and some sodas contain caffeine. Do not drink alcohol close to bedtime. Do not eat large meals close to bedtime. Do not take naps  in the late afternoon. Try to get at least 30 minutes of sunlight every day. Morning sunlight is best. Make time to relax before bed. Reading, listening to music, or taking a hot bath promotes quality sleep. Make your bedroom a place that promotes quality sleep. Keep your bedroom dark, quiet, and at a comfortable room temperature. Make sure your bed is comfortable. Take out sleep distractions like TV, a computer,  smartphone, and bright lights. If you are lying awake in bed for longer than 20 minutes, get up and do a relaxing activity until you feel sleepy. Work with your health care provider to treat medical conditions that may affect sleeping, such as: Nasal obstruction. Snoring. Sleep apnea and other sleep disorders. Talk to your health care provider if you think any of your prescription medicines may cause you to have difficulty falling or staying asleep. If you have sleep problems, talk with a sleep consultant. If you think you have a sleep disorder, talk with your health care provider about getting evaluated by a specialist. Where to find more information National Sleep Foundation website: https://sleepfoundation.org National Heart, Lung, and Blood Institute (NHLBI): https://hall.info/.pdf Centers for Disease Control and Prevention (CDC): DetailSports.is Contact a health care provider if you: Have trouble getting to sleep or staying asleep. Often wake up very early in the morning and cannot get back to sleep. Have daytime sleepiness. Have daytime sleep attacks of suddenly falling asleep and sudden muscle weakness (narcolepsy). Have a tingling sensation in your legs with a strong urge to move your legs (restless legs syndrome). Stop breathing briefly during sleep (sleep apnea). Think you have a sleep disorder or are taking a medicine that is affecting your quality of sleep. Summary Most adults need 7-8 hours of quality sleep each night. Getting enough quality sleep is an important part of health and well-being. Make your bedroom a place that promotes quality sleep and avoid things that may cause you to have poor sleep, such as alcohol, caffeine, smoking, and large meals. Talk to your health care provider if you have trouble falling asleep or staying asleep. This information is not intended to replace advice given to you by your health care  provider. Make sure you discuss any questions you have with your healthcare provider. Document Revised: 11/16/2017 Document Reviewed: 11/16/2017 Elsevier Patient Education  2022 [employer].

## 2021-03-04 ENCOUNTER — Other Ambulatory Visit: Payer: Self-pay | Admitting: Student

## 2021-03-04 NOTE — Telephone Encounter (Signed)
Prescription refill request for Eliquis received. Indication: afib  Last office visit: Irene Limbo 12/26/2020 Scr: 0.94, 12/26/2020 Age: 70 yo  Weight: 87.3 kg  Pt is on the correct dose of Eliquis per dosing criteria, prescription refill sent for Eliquis 5mg  BID.

## 2021-06-08 ENCOUNTER — Encounter: Payer: Self-pay | Admitting: *Deleted

## 2021-06-08 DIAGNOSIS — Z006 Encounter for examination for normal comparison and control in clinical research program: Secondary | ICD-10-CM

## 2021-06-08 NOTE — Research (Signed)
[id number]  EMPACT Visit 5                 Visit Date:  _10-17-2022__________-  Visit Type:                 Clinic Visit  []              Phone Visit  [x]  Pt is recovering from COVID.                         Web Based Visit   []                      Web + Phone Visit  []    Medical Records Review only  []   Patient is still recovering from her recent COVID infection, she still has a cough. She is taking her study medications as directed. Patient is not on any other SGLT2 medications at this time. She wishes to continue being in our study and to take our Study medications. Mr. Hodgkiss confirmed that she has all of my contact information should she have any questions or concerns.    Hospitalization Confirmation  Was a hospitalization reported by the patient in the [place] questionnaire?               NO  [x]      YES   []   Was the hospitalization confirmed in the telephone follow-up contact?       NO  []     YES   []    NO HOSPITALIZATION  Medication Compliance  Study medication being taken according to protocol?    NO  []    YES  [x]   SGLT1 / SGLT2 Inhibitor Treatment  Is the patient currently on active SGLT1/2 - inhibitor (e.g. empagliflozin,     Dapagliflozin, ...) treatment (not to include empagliflozin as study drug)?                  NO  [x]    YES  []     Current Outpatient Medications:    ALPRAZolam (XANAX) 0.5 MG tablet, Take 0.5 mg by mouth daily as needed for anxiety. , Disp: , Rfl:    ARTIFICIAL TEAR OP, Place 1 drop into both eyes daily as needed (dry eyes)., Disp: , Rfl:    citalopram (CELEXA) 20 MG tablet, Take 20 mg by mouth daily., Disp: , Rfl:    ELIQUIS 5 MG TABS tablet, TAKE 1 TABLET(5 MG) BY MOUTH TWICE DAILY, Disp: 60 tablet, Rfl: 5   metoprolol tartrate (LOPRESSOR) 25 MG tablet, Take 1 tablet (25 mg total) by mouth 2 (two) times daily., Disp: 60 tablet, Rfl: 1   Multiple Vitamins-Minerals (CENTRUM SILVER PO), Take 1 tablet by mouth daily., Disp: , Rfl:    rosuvastatin  (CRESTOR) 40 MG tablet, Take 1 tablet (40 mg total) by mouth at bedtime., Disp: 30 tablet, Rfl: 1   Study - EMPACT-MI - empagliflozin (JARDIANCE) 10 mg or placebo tablet (PI-Stuckey), Take 1 tablet by mouth daily., Disp: , Rfl:    SYNTHROID 100 MCG tablet, Take 100 mcg by mouth daily before breakfast. , Disp: , Rfl:

## 2021-06-16 ENCOUNTER — Other Ambulatory Visit: Payer: Self-pay | Admitting: Family Medicine

## 2021-06-16 DIAGNOSIS — Z1231 Encounter for screening mammogram for malignant neoplasm of breast: Secondary | ICD-10-CM

## 2021-06-29 ENCOUNTER — Other Ambulatory Visit: Payer: Self-pay

## 2021-06-29 ENCOUNTER — Encounter (HOSPITAL_COMMUNITY): Payer: Self-pay

## 2021-06-29 ENCOUNTER — Emergency Department (HOSPITAL_COMMUNITY)
Admission: EM | Admit: 2021-06-29 | Discharge: 2021-06-30 | Disposition: A | Discharge status: Home / Self Care | Payer: Medicare HMO | Attending: Emergency Medicine | Admitting: Emergency Medicine

## 2021-06-29 ENCOUNTER — Emergency Department (HOSPITAL_COMMUNITY): Payer: Medicare HMO

## 2021-06-29 DIAGNOSIS — R55 Syncope and collapse: Secondary | ICD-10-CM | POA: Insufficient documentation

## 2021-06-29 DIAGNOSIS — N3000 Acute cystitis without hematuria: Secondary | ICD-10-CM

## 2021-06-29 DIAGNOSIS — Z79899 Other long term (current) drug therapy: Secondary | ICD-10-CM | POA: Diagnosis not present

## 2021-06-29 DIAGNOSIS — M79661 Pain in right lower leg: Secondary | ICD-10-CM | POA: Diagnosis not present

## 2021-06-29 DIAGNOSIS — E119 Type 2 diabetes mellitus without complications: Secondary | ICD-10-CM | POA: Insufficient documentation

## 2021-06-29 DIAGNOSIS — W010XXA Fall on same level from slipping, tripping and stumbling without subsequent striking against object, initial encounter: Secondary | ICD-10-CM | POA: Insufficient documentation

## 2021-06-29 DIAGNOSIS — I10 Essential (primary) hypertension: Secondary | ICD-10-CM | POA: Insufficient documentation

## 2021-06-29 DIAGNOSIS — Y9229 Other specified public building as the place of occurrence of the external cause: Secondary | ICD-10-CM | POA: Insufficient documentation

## 2021-06-29 DIAGNOSIS — K436 Other and unspecified ventral hernia with obstruction, without gangrene: Secondary | ICD-10-CM | POA: Insufficient documentation

## 2021-06-29 DIAGNOSIS — W19XXXA Unspecified fall, initial encounter: Secondary | ICD-10-CM

## 2021-06-29 DIAGNOSIS — I700 Atherosclerosis of aorta: Secondary | ICD-10-CM | POA: Diagnosis not present

## 2021-06-29 DIAGNOSIS — I480 Paroxysmal atrial fibrillation: Secondary | ICD-10-CM | POA: Insufficient documentation

## 2021-06-29 DIAGNOSIS — M25551 Pain in right hip: Secondary | ICD-10-CM

## 2021-06-29 DIAGNOSIS — M79651 Pain in right thigh: Secondary | ICD-10-CM | POA: Insufficient documentation

## 2021-06-29 DIAGNOSIS — E039 Hypothyroidism, unspecified: Secondary | ICD-10-CM | POA: Insufficient documentation

## 2021-06-29 LAB — URINALYSIS, ROUTINE W REFLEX MICROSCOPIC
Bilirubin Urine: NEGATIVE
Glucose, UA: >=500 mg/dL — AB
Ketones, ur: NEGATIVE mg/dL
Nitrite: NEGATIVE
Protein, ur: NEGATIVE mg/dL
Specific Gravity, Urine: >1.046 — ABNORMAL HIGH (ref 1.005–1.030)
pH: 5 (ref 5.0–8.0)

## 2021-06-29 LAB — CBC WITH DIFFERENTIAL/PLATELET
Abs Immature Granulocytes: 0.04 10*3/uL (ref 0.00–0.07)
Basophils Absolute: 0.1 10*3/uL (ref 0.0–0.1)
Basophils Relative: 1 %
Eosinophils Absolute: 0.1 10*3/uL (ref 0.0–0.5)
Eosinophils Relative: 1 %
HCT: 46.9 % — ABNORMAL HIGH (ref 36.0–46.0)
Hemoglobin: 14.8 g/dL (ref 12.0–15.0)
Immature Granulocytes: 0 %
Lymphocytes Relative: 15 %
Lymphs Abs: 1.5 10*3/uL (ref 0.7–4.0)
MCH: 30 pg (ref 26.0–34.0)
MCHC: 31.6 g/dL (ref 30.0–36.0)
MCV: 95.1 fL (ref 80.0–100.0)
Monocytes Absolute: 0.6 10*3/uL (ref 0.1–1.0)
Monocytes Relative: 6 %
Neutro Abs: 7.8 10*3/uL — ABNORMAL HIGH (ref 1.7–7.7)
Neutrophils Relative %: 77 %
Platelets: 334 10*3/uL (ref 150–400)
RBC: 4.93 MIL/uL (ref 3.87–5.11)
RDW: 13 % (ref 11.5–15.5)
WBC: 10.1 10*3/uL (ref 4.0–10.5)
nRBC: 0 % (ref 0.0–0.2)

## 2021-06-29 LAB — COMPREHENSIVE METABOLIC PANEL
ALT: 21 U/L (ref 0–44)
AST: 25 U/L (ref 15–41)
Albumin: 3.6 g/dL (ref 3.5–5.0)
Alkaline Phosphatase: 31 U/L — ABNORMAL LOW (ref 38–126)
Anion gap: 9 (ref 5–15)
BUN: 23 mg/dL (ref 8–23)
CO2: 24 mmol/L (ref 22–32)
Calcium: 9.3 mg/dL (ref 8.9–10.3)
Chloride: 107 mmol/L (ref 98–111)
Creatinine, Ser: 1.22 mg/dL — ABNORMAL HIGH (ref 0.44–1.00)
GFR, Estimated: 48 mL/min — ABNORMAL LOW (ref >60)
Glucose, Bld: 123 mg/dL — ABNORMAL HIGH (ref 70–99)
Potassium: 4 mmol/L (ref 3.5–5.1)
Sodium: 140 mmol/L (ref 135–145)
Total Bilirubin: 0.4 mg/dL (ref 0.3–1.2)
Total Protein: 7.2 g/dL (ref 6.5–8.1)

## 2021-06-29 LAB — LACTIC ACID, PLASMA
Lactic Acid, Venous: 1.6 mmol/L (ref 0.5–1.9)
Lactic Acid, Venous: 1.7 mmol/L (ref 0.5–1.9)

## 2021-06-29 LAB — LIPASE, BLOOD: Lipase: 37 U/L (ref 11–51)

## 2021-06-29 MED ORDER — IOHEXOL 350 MG/ML SOLN
80.0000 mL | Freq: Once | INTRAVENOUS | Status: AC | PRN
  Administered 2021-06-29: 80 mL via INTRAVENOUS

## 2021-06-29 MED ORDER — SODIUM CHLORIDE 0.9 % IV BOLUS
1000.0000 mL | Freq: Once | INTRAVENOUS | Status: AC
  Administered 2021-06-29: 1000 mL via INTRAVENOUS

## 2021-06-29 MED ORDER — OXYCODONE-ACETAMINOPHEN 5-325 MG PO TABS
1.0000 | ORAL_TABLET | Freq: Once | ORAL | Status: AC
  Administered 2021-06-29: 1 via ORAL
  Filled 2021-06-29: qty 1

## 2021-06-29 MED ORDER — IBUPROFEN 400 MG PO TABS
600.0000 mg | ORAL_TABLET | Freq: Once | ORAL | Status: AC
  Administered 2021-06-29: 600 mg via ORAL
  Filled 2021-06-29: qty 1

## 2021-06-29 NOTE — ED Provider Notes (Signed)
Emergency Medicine Provider Triage Evaluation Note  [name] , a 70 y.o. female  was evaluated in triage.  Pt complains of fall.  Reports that she slipped on some water causing her to fall and she hit her left knee and thinks she injured something in the back of her right thigh as she can now not bear weight on the right leg but does not have pain at the knee or hip and is able to move the knee and hip without difficulty.  She was alert for the entire fall and reports she knows she did not hit her head at all.  She reports that just before she fell she had been feeling sick and a bit nauseous and after falling she vomited a few times and then had a brief syncopal episode.  She reports syncope has been an issue for her for many years and she commonly has a syncopal episode after she vomits and no one has been able to determine why.  She denies any associated chest pain or shortness of breath.  No abdominal pain.  No other aggravating or alleviating factors.  Review of Systems  Positive: Knee pain, leg pain, syncope, nausea and vomiting Negative: Head injury, chest pain, shortness of breath, abdominal pain  Physical Exam  BP (!) 88/66 (BP Location: Left Arm)   Pulse 62   Temp 97.8 F (36.6 C) (Oral)   Resp 14   SpO2 99%  Gen:   Awake, no distress   Resp:  Normal effort  MSK:   Moves extremities without difficulty  Other:    Medical Decision Making  Medically screening exam initiated at 11:19 AM.  Appropriate orders placed.  [name] was informed that the remainder of the evaluation will be completed by another provider, this initial triage assessment does not replace that evaluation, and the importance of remaining in the ED until their evaluation is complete.  Patient noted to be hypotensive and reports feeling ill this morning with vomiting, will need acute bed   Legrand Rams 06/29/21 1130    Cheryll Cockayne, MD 06/29/21 1556

## 2021-06-29 NOTE — ED Notes (Signed)
Pt ambulated with walker well.. pt did state a sharp pain when placing some pressure of her right side

## 2021-06-29 NOTE — ED Notes (Signed)
Patient transported to CT 

## 2021-06-29 NOTE — ED Provider Notes (Signed)
[facility] Provider Note   CSN: [id number] Arrival date & time: 06/29/21  1102     History No chief complaint on file.   DEDREA DECOURCY is a 70 y.o. female.  Patient presents chief complaint of right hip/right thigh pain.  She states that she had a mechanical fall today while visiting a fire department.  She says she slipped on some water and fell.  Denies head injury or loss of consciousness.  Denies neck pain or back pain.  She states that she had significant pain to her right lower extremity and could not stand up.  Denies recent illnesses such as fevers or cough or vomiting or diarrhea.      Past Medical History:  Diagnosis Date   Depression    Diabetes mellitus without complication ([place])    Hyperlipidemia 10/19/2018   Hypertension    Hypothyroidism    Stroke ([place]) 10/19/2018    Patient Active Problem List   Diagnosis Date Noted   Paroxysmal atrial fibrillation ([place]) 12/26/2020   NSTEMI (non-ST elevated myocardial infarction) ([place]) 05/24/2020   Hyperlipidemia 10/19/2018   TIA (transient ischemic attack) 10/18/2018   Depression 10/18/2018   Atypical chest pain 02/09/2013   Diabetes type 2, controlled ([place]) 02/09/2013   Hypertension 02/09/2013    Past Surgical History:  Procedure Laterality Date   APPENDECTOMY     BACK SURGERY     CHOLECYSTECTOMY     INTRAVASCULAR PRESSURE WIRE/FFR STUDY N/A 05/26/2020   Procedure: INTRAVASCULAR PRESSURE WIRE/FFR STUDY;  Surgeon: Martinique, Peter M, MD;  Location: [facility];  Service: Cardiovascular; iFR/FFR of 65% mLAD -  iFR (0.93) and FFR (0.84) = NOT HEMODYNAMICALLY/ PHYSIOLOGICALLY SIGNIFICANT.    LEFT HEART CATH AND CORONARY ANGIOGRAPHY N/A 05/26/2020   Procedure: LEFT HEART CATH AND CORONARY ANGIOGRAPHY;  Surgeon: Martinique, Peter M, MD;  Location: [place] [facility] [facility];  Service: Cardiovascular; non-STEMI = Mid LAD 65%.  FFR and IFR both negative for hemodynamic significance.  Mildly  elevated LVEDP. ->  Recommend medical management.   TRANSTHORACIC ECHOCARDIOGRAM  05/24/2020   In setting of non-STEMI: EF 60 to 65%.  No R WMA.  "Normal "diastolic parameters.  Normal RV.  Mild MR.  Mild aortic valve sclerosis without stenosis.  (Poor quality)   VAGINAL HYSTERECTOMY       OB History   No obstetric history on file.     Family History  Problem Relation Age of Onset   Colon cancer Father        died at 55   Throat cancer Brother    Breast cancer Sister        sister died at 78   Coronary artery disease Mother        stent age 51, died at age 78 of complications from diabetes/heart disease   Diabetes Mother    Arrhythmia Brother     Social History   Tobacco Use   Smoking status: Never   Smokeless tobacco: Never  Substance Use Topics   Alcohol use: No   Drug use: No    Home Medications Prior to Admission medications   Medication Sig Start Date End Date Taking? Authorizing Provider  acetaminophen (TYLENOL) 500 MG tablet Take 1,000 mg by mouth every 6 (six) hours as needed for mild pain.   Yes [provider]  ALPRAZolam Duanne Moron) 0.5 MG tablet Take 0.5 mg by mouth daily as needed for anxiety.  03/20/18  Yes [provider]  ARTIFICIAL TEAR  OP Place 1 drop into both eyes daily as needed (dry eyes).   Yes [provider]  citalopram (CELEXA) 20 MG tablet Take 20 mg by mouth daily.   Yes [provider]  ELIQUIS 5 MG TABS tablet TAKE 1 TABLET(5 MG) BY MOUTH TWICE DAILY Patient taking differently: Take 5 mg by mouth 2 (two) times daily. 03/04/21  Yes Sande Rives E, PA-C  metoprolol tartrate (LOPRESSOR) 25 MG tablet Take 1 tablet (25 mg total) by mouth 2 (two) times daily. 05/26/20  Yes Shelly Coss, MD  Multiple Vitamins-Minerals (CENTRUM SILVER PO) Take 1 tablet by mouth daily.   Yes [provider]  rosuvastatin (CRESTOR) 40 MG tablet Take 1 tablet (40 mg total) by mouth at bedtime. 05/26/20  Yes Shelly Coss, MD   Study - EMPACT-MI - empagliflozin (JARDIANCE) 10 mg or placebo tablet (PI-Stuckey) Take 1 tablet by mouth daily.   Yes Buford Dresser, MD  SYNTHROID 100 MCG tablet Take 100 mcg by mouth daily before breakfast. Brand Name only 10/10/18  Yes [provider]    Allergies    Tape  Review of Systems   Review of Systems  Constitutional:  Negative for fever.  HENT:  Negative for ear pain.   Eyes:  Negative for pain.  Respiratory:  Negative for cough.   Cardiovascular:  Negative for chest pain.  Gastrointestinal:  Negative for abdominal pain.  Genitourinary:  Negative for flank pain.  Musculoskeletal:  Negative for back pain.  Skin:  Negative for rash.  Neurological:  Negative for headaches.   Physical Exam Updated Vital Signs BP 110/67   Pulse (!) 53   Temp 97.8 F (36.6 C) (Oral)   Resp (!) 7   SpO2 96%   Physical Exam Constitutional:      General: She is not in acute distress.    Appearance: Normal appearance.  HENT:     Head: Normocephalic.     Nose: Nose normal.  Eyes:     Extraocular Movements: Extraocular movements intact.  Cardiovascular:     Rate and Rhythm: Normal rate.  Pulmonary:     Effort: Pulmonary effort is normal.  Musculoskeletal:        General: Normal range of motion.     Cervical back: Normal range of motion.     Comments: Near normal range of motion of the right hip and right knee.  Some decreased range of motion secondary to pain.  However the patient states the pain is mostly in her right posterior thigh.  Neurovascular intact bilateral lower extremities otherwise.  Pulses are 2+ dorsalis pedis good cap refill bilaterally compartments are soft.  No posterior thigh ecchymosis swelling or hematoma noted.  However tenderness present in the mid to proximal posterior thigh.  Neurological:     General: No focal deficit present.     Mental Status: She is alert. Mental status is at baseline.    ED Results / Procedures / Treatments    Labs (all labs ordered are listed, but only abnormal results are displayed) Labs Reviewed  COMPREHENSIVE METABOLIC PANEL - Abnormal; Notable for the following components:      Result Value   Glucose, Bld 123 (*)    Creatinine, Ser 1.22 (*)    Alkaline Phosphatase 31 (*)    GFR, Estimated 48 (*)    All other components within normal limits  CBC WITH DIFFERENTIAL/PLATELET - Abnormal; Notable for the following components:   HCT 46.9 (*)    Neutro Abs 7.8 (*)  All other components within normal limits  CULTURE, BLOOD (ROUTINE X 2)  CULTURE, BLOOD (ROUTINE X 2)  URINE CULTURE  LIPASE, BLOOD  URINALYSIS, ROUTINE W REFLEX MICROSCOPIC  LACTIC ACID, PLASMA  LACTIC ACID, PLASMA    EKG EKG Interpretation  Date/Time:  Monday June 29 2021 11:27:31 EST Ventricular Rate:  62 PR Interval:  172 QRS Duration: 80 QT Interval:  420 QTC Calculation: 426 R Axis:   -37 Text Interpretation: Normal sinus rhythm Left axis deviation Abnormal ECG Confirmed by Norman Clay ([id number]) on 06/29/2021 11:35:30 AM  Radiology DG Pelvis 1-2 Views  Result Date: 06/29/2021 CLINICAL DATA:  Trauma, fall EXAM: PELVIS - 1-2 VIEW COMPARISON:  None. FINDINGS: No recent fracture is seen. Bony spurs seen in the lateral aspect of both hips. Small sclerotic density in the left ischium may suggest benign bone island. Similar finding was seen in the previous CT done on 08/07/2009. IMPRESSION: No recent fracture or dislocation is seen in the pelvis. Electronically Signed   By: Ernie Avena M.D.   On: 06/29/2021 12:14   CT Head Wo Contrast  Result Date: 06/29/2021 CLINICAL DATA:  Slip and fall, anticoagulation EXAM: CT HEAD WITHOUT CONTRAST CT CERVICAL SPINE WITHOUT CONTRAST TECHNIQUE: Multidetector CT imaging of the head and cervical spine was performed following the standard protocol without intravenous contrast. Multiplanar CT image reconstructions of the cervical spine were also generated. COMPARISON:  None.  FINDINGS: CT HEAD FINDINGS Brain: No evidence of acute infarction, hemorrhage, hydrocephalus, extra-axial collection or mass lesion/mass effect. Vascular: No hyperdense vessel or unexpected calcification. Skull: Hyperostosis frontalis. Negative for fracture or focal lesion. Sinuses/Orbits: No acute finding. Other: None. CT CERVICAL SPINE FINDINGS Alignment: Normal. Skull base and vertebrae: No acute fracture. No primary bone lesion or focal pathologic process. Soft tissues and spinal canal: No prevertebral fluid or swelling. No visible canal hematoma. Disc levels: Moderate disc space height loss and osteophytosis of the lower cervical. Upper chest: Negative. Other: None. IMPRESSION: 1. No acute intracranial pathology. 2. No fracture or static subluxation of the cervical spine. 3. Moderate disc space height loss and osteophytosis of the lower cervical spine. Electronically Signed   By: Jearld Lesch M.D.   On: 06/29/2021 15:17   CT Cervical Spine Wo Contrast  Result Date: 06/29/2021 CLINICAL DATA:  Slip and fall, anticoagulation EXAM: CT HEAD WITHOUT CONTRAST CT CERVICAL SPINE WITHOUT CONTRAST TECHNIQUE: Multidetector CT imaging of the head and cervical spine was performed following the standard protocol without intravenous contrast. Multiplanar CT image reconstructions of the cervical spine were also generated. COMPARISON:  None. FINDINGS: CT HEAD FINDINGS Brain: No evidence of acute infarction, hemorrhage, hydrocephalus, extra-axial collection or mass lesion/mass effect. Vascular: No hyperdense vessel or unexpected calcification. Skull: Hyperostosis frontalis. Negative for fracture or focal lesion. Sinuses/Orbits: No acute finding. Other: None. CT CERVICAL SPINE FINDINGS Alignment: Normal. Skull base and vertebrae: No acute fracture. No primary bone lesion or focal pathologic process. Soft tissues and spinal canal: No prevertebral fluid or swelling. No visible canal hematoma. Disc levels: Moderate disc space  height loss and osteophytosis of the lower cervical. Upper chest: Negative. Other: None. IMPRESSION: 1. No acute intracranial pathology. 2. No fracture or static subluxation of the cervical spine. 3. Moderate disc space height loss and osteophytosis of the lower cervical spine. Electronically Signed   By: Jearld Lesch M.D.   On: 06/29/2021 15:17   CT FEMUR RIGHT W CONTRAST  Result Date: 06/29/2021 CLINICAL DATA:  Right posterior thigh pain on Eliquis. Slip and fall  this morning with right lower extremity pain EXAM: CT OF THE LOWER RIGHT EXTREMITY WITH CONTRAST TECHNIQUE: Multidetector CT imaging of the lower right extremity was performed according to the standard protocol following intravenous contrast administration. CONTRAST:  56mL OMNIPAQUE IOHEXOL 350 MG/ML SOLN COMPARISON:  None. FINDINGS: Bones/Joint/Cartilage No evidence of fracture or dislocation. No suspicious osseous lesion. Ligaments Suboptimally assessed by CT. Muscles and Tendons Muscles are normal in bulk and density. No evidence of intramuscular hematoma. Soft tissues Skin and subcutaneous soft tissues are within normal limits. The vascular structures are intact. No evidence of contrast extravasation IMPRESSION: 1.  No evidence of fracture or dislocation. 2.  No CT evidence of soft tissue injury. Electronically Signed   By: Keane Police D.O.   On: 06/29/2021 15:34   CT CHEST ABDOMEN PELVIS W CONTRAST  Result Date: 06/29/2021 CLINICAL DATA:  Slip and fall, on Eliquis, hypotensive EXAM: CT CHEST, ABDOMEN, AND PELVIS WITH CONTRAST TECHNIQUE: Multidetector CT imaging of the chest, abdomen and pelvis was performed following the standard protocol during bolus administration of intravenous contrast. CONTRAST:  23mL OMNIPAQUE IOHEXOL 350 MG/ML SOLN COMPARISON:  CT abdomen pelvis, 07/16/2008 FINDINGS: CT CHEST FINDINGS Cardiovascular: Aortic atherosclerosis. Normal heart size. No pericardial effusion. Mediastinum/Nodes: No enlarged mediastinal, hilar,  or axillary lymph nodes. Thyroid gland, trachea, and esophagus demonstrate no significant findings. Lungs/Pleura: Bandlike scarring or atelectasis of the bilateral lung bases. No pleural effusion or pneumothorax. Musculoskeletal: No chest wall mass or suspicious bone lesions identified. CT ABDOMEN PELVIS FINDINGS Hepatobiliary: No solid liver abnormality is seen. Multiple low-attenuation lesions scattered throughout the liver, the largest which are fluid attenuation cysts, others too small to characterize although almost certainly additional tiny cysts or hemangiomata. Status post cholecystectomy. No biliary dilatation. Pancreas: Unremarkable. No pancreatic ductal dilatation or surrounding inflammatory changes. Spleen: Normal in size without significant abnormality. Adrenals/Urinary Tract: Adrenal glands are unremarkable. Numerous bilateral renal cysts of varying sizes, with associated cortical scarring, appearance similar prior examination. No hydronephrosis. 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 Bladder is unremarkable. Stomach/Bowel: Stomach is within normal limits. Appendix not clearly visualized and may be surgically absent. No evidence of bowel wall thickening, distention, or inflammatory changes. Pancolonic diverticulosis. Vascular/Lymphatic: Aortic atherosclerosis. No enlarged abdominal or pelvic lymph nodes. Reproductive: Status hysterectomy. Other: Fat containing right lower quadrant spigelian hernia (series 4, image 97. No abdominopelvic ascites. Musculoskeletal: No acute or significant osseous findings. IMPRESSION: 1. No evidence of acute traumatic injury to the chest, abdomen, or pelvis. 2. 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 3. Pancolonic diverticulosis without evidence of acute diverticulitis. 4. Status post hysterectomy and cholecystectomy. Aortic Atherosclerosis (ICD10-I70.0). Electronically Signed   By: Delanna Ahmadi M.D.   On: 06/29/2021 15:28   DG Femur Min 2  Views Right  Result Date: 06/29/2021 CLINICAL DATA:  Fall EXAM: RIGHT FEMUR 2 VIEWS COMPARISON:  None. FINDINGS: There is no evidence of fracture or other focal bone lesions. Soft tissues are unremarkable. IMPRESSION: No fracture or dislocation. Electronically Signed   By: Suzy Bouchard M.D.   On: 06/29/2021 12:37    Procedures Procedures   Medications Ordered in ED Medications  sodium chloride 0.9 % bolus 1,000 mL (0 mLs Intravenous Stopped 06/29/21 1351)  sodium chloride 0.9 % bolus 1,000 mL (1,000 mLs Intravenous New Bag/Given 06/29/21 1508)  iohexol (OMNIPAQUE) 350 MG/ML injection 80 mL (80 mLs Intravenous Contrast Given 06/29/21 1510)  oxyCODONE-acetaminophen (PERCOCET/ROXICET) 5-325 MG per tablet 1 tablet (1 tablet Oral Given 06/29/21 1550)  ibuprofen (ADVIL) tablet 600  mg (600 mg Oral Given 06/29/21 1550)    ED Course  I have reviewed the triage vital signs and the nursing notes.  Pertinent labs & imaging results that were available during my care of the patient were reviewed by me and considered in my medical decision making (see chart for details).    MDM Rules/Calculators/A&P                           X-ray and CT imaging is unremarkable any acute fracture dislocation or foreign body.  Patient arrived hypotensive.  Additional labs and studies ordered due to her hypotension.  Incidental finding of fat-containing spigelian hernia in the right lower quadrant, patient states this is known to her, chronic and is causing the patient only mild discomfort.  Labs unremarkable, however pending lactic acid and urinalysis.  Blood pressure appears improved with IV fluid resuscitation.  Final Clinical Impression(s) / ED Diagnoses Final diagnoses:  Right hip pain    Rx / DC Orders ED Discharge Orders     None        Luna Fuse, MD 06/29/21 1557

## 2021-06-29 NOTE — ED Triage Notes (Signed)
Patient arrived by [facility] after slipping on water this am and fell injuring leg. Patient with possible loc, takes eliquis, denies hitting head. Patient alert and oriented, complains of right hamstring pain, pain worse with any ambulation.

## 2021-06-30 LAB — URINE CULTURE: Culture: <10000 — AB

## 2021-06-30 MED ORDER — CEPHALEXIN 500 MG PO CAPS: 500.0000 mg | ORAL_CAPSULE | Freq: Four times a day (QID) | ORAL | 0 refills | Status: DC

## 2021-06-30 MED ORDER — TRAMADOL HCL 50 MG PO TABS: 50.0000 mg | ORAL_TABLET | Freq: Four times a day (QID) | ORAL | 0 refills | Status: DC | PRN

## 2021-06-30 NOTE — Discharge Instructions (Addendum)
Follow-up with urologist regarding the presumed urinary tract infection.  Urine cultures have been sent and are pending.  Use the walker to walk.  Make an appointment to follow-up with your doctor.  Also given referral to orthopedics.  Take the tramadol as directed.  Work-up otherwise without any significant abnormalities with the CAT scans.  Dan Humphreys has been ordered.

## 2021-06-30 NOTE — Discharge Planning (Signed)
RNCM consulted regarding pt needing DME rolling walker.  RNCM informed that delivery to bedside can be requested; home delivery for a fee; and store pickup as options for receiving tolling walker today.  Pt states she has access to a rolling walker via a family friend and will utilize that option instead.

## 2021-06-30 NOTE — ED Notes (Signed)
A staff member wheeled pt to the front for discharge and did not get discharge vitals on the pt. Pt is gone. Note made.

## 2021-07-04 LAB — CULTURE, BLOOD (ROUTINE X 2)
Culture: NO GROWTH
Culture: NO GROWTH
Special Requests: ADEQUATE

## 2021-07-23 ENCOUNTER — Ambulatory Visit
Admission: RE | Admit: 2021-07-23 | Discharge: 2021-07-23 | Disposition: A | Discharge status: Home / Self Care | Payer: Medicare HMO | Source: Ambulatory Visit | Attending: Family Medicine | Admitting: Family Medicine

## 2021-07-23 DIAGNOSIS — Z1231 Encounter for screening mammogram for malignant neoplasm of breast: Secondary | ICD-10-CM

## 2021-07-31 NOTE — Progress Notes (Signed)
HPI: Follow-up coronary artery disease.  Carotid Dopplers February 2020 showed no significant stenosis.  Cardiac catheterization October 2021 showed 65% mid LAD.  FFR normal and therefore treated medically.  Echocardiogram October 2021 showed normal LV function.  During hospitalization also noted to have paroxysmal atrial fibrillation on telemetry.  Monitor May 2022 showed sinus rhythm with PACs, PVCs and paroxysmal atrial fibrillation.  Since last seen, there is no dyspnea, chest pain or syncope.  She has occasional short bouts of atrial fibrillation that are not particularly bothersome.  She has not had any bleeding.  Current Outpatient Medications  Medication Sig Dispense Refill   acetaminophen (TYLENOL) 500 MG tablet Take 1,000 mg by mouth every 6 (six) hours as needed for mild pain.     ALPRAZolam (XANAX) 0.5 MG tablet Take 0.5 mg by mouth daily as needed for anxiety.      ARTIFICIAL TEAR OP Place 1 drop into both eyes daily as needed (dry eyes).     cephALEXin (KEFLEX) 500 MG capsule Take 1 capsule (500 mg total) by mouth 4 (four) times daily. 28 capsule 0   citalopram (CELEXA) 20 MG tablet Take 20 mg by mouth daily.     ELIQUIS 5 MG TABS tablet TAKE 1 TABLET(5 MG) BY MOUTH TWICE DAILY (Patient taking differently: Take 5 mg by mouth 2 (two) times daily.) 60 tablet 5   metoprolol tartrate (LOPRESSOR) 25 MG tablet Take 1 tablet (25 mg total) by mouth 2 (two) times daily. 60 tablet 1   Multiple Vitamins-Minerals (CENTRUM SILVER PO) Take 1 tablet by mouth daily.     rosuvastatin (CRESTOR) 40 MG tablet Take 1 tablet (40 mg total) by mouth at bedtime. 30 tablet 1   Study - EMPACT-MI - empagliflozin (JARDIANCE) 10 mg or placebo tablet (PI-Stuckey) Take 1 tablet by mouth daily.     SYNTHROID 100 MCG tablet Take 100 mcg by mouth daily before breakfast. Brand Name only     traMADol (ULTRAM) 50 MG tablet Take 1 tablet (50 mg total) by mouth every 6 (six) hours as needed. 15 tablet 0   No  current facility-administered medications for this visit.     Past Medical History:  Diagnosis Date   Depression    Diabetes mellitus without complication (HCC)    Hyperlipidemia 10/19/2018   Hypertension    Hypothyroidism    Stroke (HCC) 10/19/2018    Past Surgical History:  Procedure Laterality Date   APPENDECTOMY     BACK SURGERY     CHOLECYSTECTOMY     INTRAVASCULAR PRESSURE WIRE/FFR STUDY N/A 05/26/2020   Procedure: INTRAVASCULAR PRESSURE WIRE/FFR STUDY;  Surgeon: Swaziland, Peter M, MD;  Location: [facility] INVASIVE CV LAB;  Service: Cardiovascular; iFR/FFR of 65% mLAD -  iFR (0.93) and FFR (0.84) = NOT HEMODYNAMICALLY/ PHYSIOLOGICALLY SIGNIFICANT.    LEFT HEART CATH AND CORONARY ANGIOGRAPHY N/A 05/26/2020   Procedure: LEFT HEART CATH AND CORONARY ANGIOGRAPHY;  Surgeon: Swaziland, Peter M, MD;  Location: [facility] INVASIVE CV LAB;  Service: Cardiovascular; non-STEMI = Mid LAD 65%.  FFR and IFR both negative for hemodynamic significance.  Mildly elevated LVEDP. ->  Recommend medical management.   TRANSTHORACIC ECHOCARDIOGRAM  05/24/2020   In setting of non-STEMI: EF 60 to 65%.  No R WMA.  "Normal "diastolic parameters.  Normal RV.  Mild MR.  Mild aortic valve sclerosis without stenosis.  (Poor quality)   VAGINAL HYSTERECTOMY      Social History   Socioeconomic History   Marital status: Married  Spouse name: Not on file   Number of children: Not on file   Years of education: Not on file   Highest education level: Not on file  Occupational History   Not on file  Tobacco Use   Smoking status: Never   Smokeless tobacco: Never  Substance and Sexual Activity   Alcohol use: No   Drug use: No   Sexual activity: Not on file  Other Topics Concern   Not on file  Social History Narrative   Not on file   Social Determinants of Health   Financial Resource Strain: Not on file  Food Insecurity: Not on file  Transportation Needs: Not on file  Physical Activity: Not on file  Stress: Not on file   Social Connections: Not on file  Intimate Partner Violence: Not on file    Family History  Problem Relation Age of Onset   Coronary artery disease Mother        stent age 56, died at age 19 of complications from diabetes/heart disease   Diabetes Mother    Colon cancer Father        died at 24   Breast cancer Sister        55s   Breast cancer Daughter 44   Throat cancer Brother    Arrhythmia Brother     ROS: no fevers or chills, productive cough, hemoptysis, dysphasia, odynophagia, melena, hematochezia, dysuria, hematuria, rash, seizure activity, orthopnea, PND, pedal edema, claudication. Remaining systems are negative.  Physical Exam: Well-developed well-nourished in no acute distress.  Skin is warm and dry.  HEENT is normal.  Neck is supple.  Chest is clear to auscultation with normal expansion.  Cardiovascular exam is regular rate and rhythm.  Abdominal exam nontender or distended. No masses palpated. Extremities show no edema. neuro grossly intact  A/P  1 paroxysmal atrial fibrillation-previous echocardiogram showed preserved LV function.  Continue metoprolol and apixaban.  If symptoms become more frequent could consider referral for ablation versus addition of Tikosyn.  She would not be a good candidate for flecainide given baseline coronary disease.  2 coronary artery disease-she denies chest pain.  Continue medical therapy.  She is not on aspirin given need for anticoagulation.  Continue statin.  3 hypertension-blood pressure controlled.  Continue present medical regimen.  4 hyperlipidemia-continue statin.  Check lipids.  Recent LFTs normal.  Kirk Ruths, MD

## 2021-08-06 ENCOUNTER — Ambulatory Visit (INDEPENDENT_AMBULATORY_CARE_PROVIDER_SITE_OTHER): Payer: Medicare HMO | Admitting: Cardiology

## 2021-08-06 ENCOUNTER — Encounter: Payer: Self-pay | Admitting: Cardiology

## 2021-08-06 ENCOUNTER — Other Ambulatory Visit: Payer: Self-pay

## 2021-08-06 VITALS — BP 130/72 | HR 61 | Ht 65.0 in | Wt 179.2 lb

## 2021-08-06 DIAGNOSIS — I480 Paroxysmal atrial fibrillation: Secondary | ICD-10-CM | POA: Diagnosis not present

## 2021-08-06 DIAGNOSIS — I10 Essential (primary) hypertension: Secondary | ICD-10-CM

## 2021-08-06 DIAGNOSIS — I2510 Atherosclerotic heart disease of native coronary artery without angina pectoris: Secondary | ICD-10-CM | POA: Diagnosis not present

## 2021-08-06 DIAGNOSIS — E785 Hyperlipidemia, unspecified: Secondary | ICD-10-CM

## 2021-08-06 NOTE — Patient Instructions (Signed)
Medication Instructions:  No Changes In Medications at this time.  *If you need a refill on your cardiac medications before your next appointment, please call your pharmacy*  Lab Work: PLEASE RETURN FOR BLOOD WORK- LAB HOURS ARE 8am-4pm If you have labs (blood work) drawn today and your tests are completely normal, you will receive your results only by: MyChart Message (if you have MyChart) OR A paper copy in the mail If you have any lab test that is abnormal or we need to change your treatment, we will call you to review the results.  Follow-Up: At [facility], you and your health needs are our priority.  As part of our continuing mission to provide you with exceptional heart care, we have created designated Provider Care Teams.  These Care Teams include your primary Cardiologist (physician) and Advanced Practice Providers (APPs -  Physician Assistants and Nurse Practitioners) who all work together to provide you with the care you need, when you need it.  We recommend signing up for the patient portal called "MyChart".  Sign up information is provided on this After Visit Summary.  MyChart is used to connect with patients for Virtual Visits (Telemedicine).  Patients are able to view lab/test results, encounter notes, upcoming appointments, etc.  Non-urgent messages can be sent to your provider as well.   To learn more about what you can do with MyChart, go to [email or web address].    Your next appointment:   1 year(s)  The format for your next appointment:   In Person  Provider:   Olga Millers, MD

## 2021-08-07 LAB — EXTERNAL GENERIC LAB PROCEDURE: COLOGUARD: NEGATIVE

## 2021-08-07 LAB — COLOGUARD: COLOGUARD: NEGATIVE

## 2021-08-22 ENCOUNTER — Other Ambulatory Visit: Payer: Self-pay | Admitting: Student

## 2021-08-25 NOTE — Telephone Encounter (Signed)
Prescription refill request for Eliquis received. Indication: Afib  Last office visit:08/06/21 (Crenshaw)  Scr: 0.90 (07/22/21)  Age: 71 Weight: 81.3kg  Appropriate dose and refill sent to requested pharmacy.

## 2021-08-27 LAB — LIPID PANEL
Chol/HDL Ratio: 2.5 ratio (ref 0.0–4.4)
Cholesterol, Total: 106 mg/dL (ref 100–199)
HDL: 43 mg/dL (ref >39)
LDL Chol Calc (NIH): 41 mg/dL (ref 0–99)
Triglycerides: 121 mg/dL (ref 0–149)
VLDL Cholesterol Cal: 22 mg/dL (ref 5–40)

## 2021-12-08 ENCOUNTER — Encounter: Payer: Self-pay | Admitting: *Deleted

## 2021-12-08 DIAGNOSIS — Z006 Encounter for examination for normal comparison and control in clinical research program: Secondary | ICD-10-CM

## 2021-12-08 NOTE — Research (Signed)
[id number]  EMPACT Visit 6       ? ?Visit Date:  _04-18-2023- ? ?Visit Type:                 Clinic Visit  [x]  ?            Phone Visit  []  ?                       Web Based Visit   []  ?                    Web + Phone Visit  []  ?  Medical Records Review only  []  ? ?Patient is still doing well with no CP, AEs, SAEs, Hospitalizations or Urgent care visits. She is taking her study medications as directed. Patient is not on any other SGLT2 medications at this time. She wishes to continue being in our study and to take our Study medications. Mrs. Kinkaid confirmed that she has all of my contact information should she have any questions or concerns.  ? ?Patient will need a new kit today; Kit [id number] is dispensed.  ? ?Hospitalization Confirmation ? ?Was a hospitalization reported by the patient in the [place] questionnaire? ?              NO  [x]      YES   []  ? ?Was the hospitalization confirmed in the telephone follow-up contact? ?      NO  []     YES   [x]  ? ?Medication Compliance ? ?Study medication being taken according to protocol? ?   NO  []    YES  [x]  ? ?SGLT1 / SGLT2 Inhibitor Treatment ? ?Is the patient currently on active SGLT1/2 - inhibitor (e.g. empagliflozin, ?    Dapagliflozin, ...) treatment (not to include empagliflozin as study drug)? ?              ?   NO  [x]    YES  []   ? ? ?Current Outpatient Medications:  ?  acetaminophen (TYLENOL) 500 MG tablet, Take 1,000 mg by mouth every 6 (six) hours as needed for mild pain., Disp: , Rfl:  ?  ALPRAZolam (XANAX) 0.5 MG tablet, Take 0.5 mg by mouth daily as needed for anxiety. , Disp: , Rfl:  ?  ARTIFICIAL TEAR OP, Place 1 drop into both eyes daily as needed (dry eyes)., Disp: , Rfl:  ?  cephALEXin (KEFLEX) 500 MG capsule, Take 1 capsule (500 mg total) by mouth 4 (four) times daily., Disp: 28 capsule, Rfl: 0 ?  citalopram (CELEXA) 20 MG tablet, Take 20 mg by mouth daily., Disp: , Rfl:  ?  ELIQUIS 5 MG TABS tablet, TAKE 1 TABLET(5 MG) BY MOUTH TWICE DAILY, Disp: 60 tablet,  Rfl: 5 ?  metoprolol tartrate (LOPRESSOR) 25 MG tablet, Take 1 tablet (25 mg total) by mouth 2 (two) times daily., Disp: 60 tablet, Rfl: 1 ?  Multiple Vitamins-Minerals (CENTRUM SILVER PO), Take 1 tablet by mouth daily., Disp: , Rfl:  ?  rosuvastatin (CRESTOR) 40 MG tablet, Take 1 tablet (40 mg total) by mouth at bedtime., Disp: 30 tablet, Rfl: 1 ?  Study - EMPACT-MI - empagliflozin (JARDIANCE) 10 mg or placebo tablet (PI-Stuckey), Take 1 tablet by mouth daily., Disp: , Rfl:  ?  SYNTHROID 100 MCG tablet, Take 100 mcg by mouth daily before breakfast. Brand Name only, Disp: , Rfl:  ?  traMADol (ULTRAM) 50 MG tablet, Take 1 tablet (50  mg total) by mouth every 6 (six) hours as needed., Disp: 15 tablet, Rfl: 0  ?

## 2022-03-14 ENCOUNTER — Other Ambulatory Visit: Payer: Self-pay | Admitting: Cardiology

## 2022-03-15 NOTE — Telephone Encounter (Signed)
Prescription refill request for Eliquis received. Indication:Afib Last office visit:12/22 Scr:0.9 Age: 71 Weight:81.3 kg  Prescription refilled

## 2022-05-06 ENCOUNTER — Encounter: Payer: Self-pay | Admitting: *Deleted

## 2022-05-06 ENCOUNTER — Encounter: Payer: Medicare HMO | Admitting: *Deleted

## 2022-05-06 VITALS — BP 119/58 | HR 54 | Resp 16 | Wt 182.0 lb

## 2022-05-06 DIAGNOSIS — Z006 Encounter for examination for normal comparison and control in clinical research program: Secondary | ICD-10-CM

## 2022-05-07 NOTE — Research (Addendum)
Patient here for EOS visit.  She brought back her kit. Medication was taken according to protocol.  Patient is not currently on a SGLT1/2 inhibitor.  Kit # [id number] = 1 box un open and 2 empty, 30 tablets taken out of one box Patient states last dose of study drug was 05/02/2022 Last date of assessments of all non-fetal events.  No un-blinding at site  No AE or SAE or hospitalizations to report.  Subject completed planned treatment period for study.   Thanked the patient for being in the study.   Will reach out to her cardiologist and let him know that she has completed the study.    Current Outpatient Medications:    acetaminophen (TYLENOL) 500 MG tablet, Take 1,000 mg by mouth every 6 (six) hours as needed for mild pain., Disp: , Rfl:    ALPRAZolam (XANAX) 0.5 MG tablet, Take 0.5 mg by mouth daily as needed for anxiety. , Disp: , Rfl:    ARTIFICIAL TEAR OP, Place 1 drop into both eyes daily as needed (dry eyes)., Disp: , Rfl:    citalopram (CELEXA) 20 MG tablet, Take 20 mg by mouth daily., Disp: , Rfl:    ELIQUIS 5 MG TABS tablet, TAKE 1 TABLET(5 MG) BY MOUTH TWICE DAILY, Disp: 60 tablet, Rfl: 5   metoprolol tartrate (LOPRESSOR) 25 MG tablet, Take 1 tablet (25 mg total) by mouth 2 (two) times daily., Disp: 60 tablet, Rfl: 1   Multiple Vitamins-Minerals (CENTRUM SILVER PO), Take 1 tablet by mouth daily., Disp: , Rfl:    rosuvastatin (CRESTOR) 40 MG tablet, Take 1 tablet (40 mg total) by mouth at bedtime., Disp: 30 tablet, Rfl: 1   SYNTHROID 100 MCG tablet, Take 100 mcg by mouth daily before breakfast. Brand Name only, Disp: , Rfl:    Philemon Kingdom :) RN BSN  Clinical Research Nurse  Be strong and take heart, all you who hope in the [place]. ~ Psalm 31:24

## 2022-06-01 ENCOUNTER — Encounter: Payer: Self-pay | Admitting: *Deleted

## 2022-06-01 DIAGNOSIS — Z006 Encounter for examination for normal comparison and control in clinical research program: Secondary | ICD-10-CM

## 2022-06-01 NOTE — Research (Signed)
[id number]   EMPACT End of Study                         Visit Date:  __10OCT 2023   follow-up  Visit Type:                 Clinic Visit  []              Phone Visit  [x]                         Web Based Visit   []                      Web + Phone Visit  []    Medical Records Review only  []   Hospitalization Confirmation  Was a hospitalization reported by the patient in the [facility] questionnaire?               NO  [x]      YES   []   Was the hospitalization confirmed in the telephone follow-up contact?       NO  [x]     YES   []    VITAL STATUS:   Vital status Date: _10OCt2023  Subject Status:    Alive  [x]   Dead  []   Unknown  []     (If subject died, follow reporting instructions as outlined in the protocol)  Information Source: (Leave blank if subject status is Unknown)                  Study Subject  [x]                    Relative  []                                 Friend   []                             Caregiver  []     Health Care professional  []                 Public Database  []    END OF STUDY   End of Study participation date;  _10OCT2023 Did the subject complete the planned observation period:  NO  []    YES[x]      If no, provide the primary reason;   Withdrawal by Subject  []                                 Death  []                                                        Lost to follow-up (specify)  []                                         Other (specify)  []   Comment:_________________  If Death, Death date:     ________  If Vital Status is Unknown, what was the date the subject was  last known to be alive:    Last date of assessments of all non-fatal events 10Oct. 2023

## 2022-06-14 ENCOUNTER — Other Ambulatory Visit: Payer: Self-pay | Admitting: Family Medicine

## 2022-06-14 DIAGNOSIS — Z1231 Encounter for screening mammogram for malignant neoplasm of breast: Secondary | ICD-10-CM

## 2022-06-17 NOTE — Research (Signed)
[id number]   EMPACT End of Study                         Visit Date:  09/14 2023 Visit Type:                 Clinic Visit  [x]              Phone Visit  []                         Web Based Visit   []                      Web + Phone Visit  []    Medical Records Review only  []   Hospitalization Confirmation  Was a hospitalization reported by the patient in the [place] questionnaire?               NO  [x]      YES   []   Was the hospitalization confirmed in the telephone follow-up contact?       NO  [x]     YES   []   Medication Compliance  Study medication being taken according to protocol?    NO  []    YES  [x]   SGLT1 / SGLT2 Inhibitor Treatment  Is the patient currently on active SGLT1/2 - inhibitor (e.g. empagliflozin,     Dapagliflozin, ...) treatment (not to include empagliflozin as study drug)?                  NO  [x]    YES  []   VITAL STATUS:   Vital status Date: 05/06/22  Subject Status:    Alive  [x]   Dead  []   Unknown  []     (If subject died, follow reporting instructions as outlined in the protocol)  Information Source: (Leave blank if subject status is Unknown)                  Study Subject  [x]                    Relative  []                                 Friend   []                             Caregiver  []     Health Care professional  []                 Public Database  []    Pregnancy Test (Urine)   if applicable  Specimen Collection Date:  _not applicable  Pregnancy Test Result           Negative  []                                                  Positive   []                                               Not Done    []   END OF STUDY   End of Study participation date;  05/06/22  Did the subject complete the planned observation period:  NO  []    YES[x]      If no, provide the primary reason;   Withdrawal by Subject  []                                 Death  []                                                        Lost to follow-up (specify)  []                                          Other (specify)  []   Comment:_________________  If Death, Death date:     ________  If Vital Status is Unknown, what was the date the subject was last known to be alive:    Last date of assessments of all non-fatal events (dd MMM YYY)  _09/14/2023_________    Current Outpatient Medications:    acetaminophen (TYLENOL) 500 MG tablet, Take 1,000 mg by mouth every 6 (six) hours as needed for mild pain., Disp: , Rfl:    ALPRAZolam (XANAX) 0.5 MG tablet, Take 0.5 mg by mouth daily as needed for anxiety. , Disp: , Rfl:    ARTIFICIAL TEAR OP, Place 1 drop into both eyes daily as needed (dry eyes)., Disp: , Rfl:    citalopram (CELEXA) 20 MG tablet, Take 20 mg by mouth daily., Disp: , Rfl:    ELIQUIS 5 MG TABS tablet, TAKE 1 TABLET(5 MG) BY MOUTH TWICE DAILY, Disp: 60 tablet, Rfl: 5   metoprolol tartrate (LOPRESSOR) 25 MG tablet, Take 1 tablet (25 mg total) by mouth 2 (two) times daily., Disp: 60 tablet, Rfl: 1   Multiple Vitamins-Minerals (CENTRUM SILVER PO), Take 1 tablet by mouth daily., Disp: , Rfl:    rosuvastatin (CRESTOR) 40 MG tablet, Take 1 tablet (40 mg total) by mouth at bedtime., Disp: 30 tablet, Rfl: 1   SYNTHROID 100 MCG tablet, Take 100 mcg by mouth daily before breakfast. Brand Name only, Disp: , Rfl:

## 2022-08-03 ENCOUNTER — Other Ambulatory Visit: Payer: Self-pay | Admitting: Family Medicine

## 2022-08-03 DIAGNOSIS — Z780 Asymptomatic menopausal state: Secondary | ICD-10-CM

## 2022-08-06 ENCOUNTER — Ambulatory Visit: Payer: Medicare Other | Attending: Nurse Practitioner | Admitting: Nurse Practitioner

## 2022-08-06 ENCOUNTER — Encounter: Payer: Self-pay | Admitting: Nurse Practitioner

## 2022-08-06 VITALS — BP 116/84 | HR 54 | Ht 65.0 in | Wt 180.6 lb

## 2022-08-06 DIAGNOSIS — I480 Paroxysmal atrial fibrillation: Secondary | ICD-10-CM

## 2022-08-06 DIAGNOSIS — I2510 Atherosclerotic heart disease of native coronary artery without angina pectoris: Secondary | ICD-10-CM | POA: Diagnosis not present

## 2022-08-06 DIAGNOSIS — I10 Essential (primary) hypertension: Secondary | ICD-10-CM | POA: Diagnosis not present

## 2022-08-06 DIAGNOSIS — E785 Hyperlipidemia, unspecified: Secondary | ICD-10-CM | POA: Diagnosis not present

## 2022-08-06 NOTE — Patient Instructions (Signed)
Medication Instructions:  Your physician recommends that you continue on your current medications as directed. Please refer to the Current Medication list given to you today.  *If you need a refill on your cardiac medications before your next appointment, please call your pharmacy*  Lab Work: NONE ordered at this time of appointment   If you have labs (blood work) drawn today and your tests are completely normal, you will receive your results only by: MyChart Message (if you have MyChart) OR A paper copy in the mail If you have any lab test that is abnormal or we need to change your treatment, we will call you to review the results.  Testing/Procedures: NONE ordered at this time of appointment   Follow-Up: At [facility], you and your health needs are our priority.  As part of our continuing mission to provide you with exceptional heart care, we have created designated Provider Care Teams.  These Care Teams include your primary Cardiologist (physician) and Advanced Practice Providers (APPs -  Physician Assistants and Nurse Practitioners) who all work together to provide you with the care you need, when you need it.  We recommend signing up for the patient portal called "MyChart".  Sign up information is provided on this After Visit Summary.  MyChart is used to connect with patients for Virtual Visits (Telemedicine).  Patients are able to view lab/test results, encounter notes, upcoming appointments, etc.  Non-urgent messages can be sent to your provider as well.   To learn more about what you can do with MyChart, go to [email or web address].    Your next appointment:   1 year(s)  The format for your next appointment:   In Person  Provider:   Brian Crenshaw, MD     Other Instructions   Important Information About Sugar       

## 2022-08-06 NOTE — Progress Notes (Signed)
Office Visit    Patient Name: [name] Date of Encounter: 08/06/2022  Primary Care Provider:  Gwenlyn Found, MD Primary Cardiologist:  Olga Millers, MD  Chief Complaint    71 year old female with a history of CAD, paroxysmal atrial fibrillation, hypertension, and hyperlipidemia who presents for follow-up related to CAD and atrial fibrillation.  Past Medical History    Past Medical History:  Diagnosis Date   Depression    Diabetes mellitus without complication (HCC)    Hyperlipidemia 10/19/2018   Hypertension    Hypothyroidism    Stroke (HCC) 10/19/2018   Past Surgical History:  Procedure Laterality Date   APPENDECTOMY     BACK SURGERY     CHOLECYSTECTOMY     INTRAVASCULAR PRESSURE WIRE/FFR STUDY N/A 05/26/2020   Procedure: INTRAVASCULAR PRESSURE WIRE/FFR STUDY;  Surgeon: Swaziland, Peter M, MD;  Location: [facility] INVASIVE CV LAB;  Service: Cardiovascular; iFR/FFR of 65% mLAD -  iFR (0.93) and FFR (0.84) = NOT HEMODYNAMICALLY/ PHYSIOLOGICALLY SIGNIFICANT.    LEFT HEART CATH AND CORONARY ANGIOGRAPHY N/A 05/26/2020   Procedure: LEFT HEART CATH AND CORONARY ANGIOGRAPHY;  Surgeon: Swaziland, Peter M, MD;  Location: [facility] INVASIVE CV LAB;  Service: Cardiovascular; non-STEMI = Mid LAD 65%.  FFR and IFR both negative for hemodynamic significance.  Mildly elevated LVEDP. ->  Recommend medical management.   TRANSTHORACIC ECHOCARDIOGRAM  05/24/2020   In setting of non-STEMI: EF 60 to 65%.  No R WMA.  "Normal "diastolic parameters.  Normal RV.  Mild MR.  Mild aortic valve sclerosis without stenosis.  (Poor quality)   VAGINAL HYSTERECTOMY      Allergies  Allergies  Allergen Reactions   Tape Rash    Can only tolerate LIMITED exposure; no "plastic tape," please    History of Present Illness    71 year old female with the above past medical history including CAD, parxoysmal atrial fibrillation, hypertension, and hyperlipidemia.  She was hospitalized in 05/2020 in the setting of  NSTEMI.  Cardiac catheterization at the time showed 65% mid LAD stenosis, normal FFR.  Medical management was advised.  Echocardiogram at the time showed normal LV function.  Telemetry monitoring showed paroxysmal atrial fibrillation. She was started on Eliquis.  Outpatient cardiac monitor in May 2022 showed sinus rhythm with PACs, PVCs, paroxysmal atrial fibrillation.  Last seen in the office on 08/06/2021 and was stable from a cardiac standpoint.  She noted infrequent episodes of atrial fibrillation.  It was noted that should she have more frequent symptoms, could consider referral for ablation versus addition of Tikosyn is not a good candidate for flecainide given baseline CAD).  She presents today for follow-up.  Since her last visit she has been stable from a cardiac standpoint.  She notes brief infrequent episodes of breakthrough atrial fibrillation, particularly when she is upset.  She denies symptoms concerning for angina.  Overall, she reports feeling well.  Home Medications    Current Outpatient Medications  Medication Sig Dispense Refill   acetaminophen (TYLENOL) 500 MG tablet Take 1,000 mg by mouth every 6 (six) hours as needed for mild pain.     ALPRAZolam (XANAX) 0.5 MG tablet Take 0.5 mg by mouth daily as needed for anxiety.      ARTIFICIAL TEAR OP Place 1 drop into both eyes daily as needed (dry eyes).     citalopram (CELEXA) 20 MG tablet Take 20 mg by mouth daily.     ELIQUIS 5 MG TABS tablet TAKE 1 TABLET(5 MG) BY MOUTH TWICE DAILY 60  tablet 5   metoprolol tartrate (LOPRESSOR) 25 MG tablet Take 1 tablet (25 mg total) by mouth 2 (two) times daily. 60 tablet 1   Multiple Vitamins-Minerals (CENTRUM SILVER PO) Take 1 tablet by mouth daily.     rosuvastatin (CRESTOR) 40 MG tablet Take 1 tablet (40 mg total) by mouth at bedtime. 30 tablet 1   SYNTHROID 100 MCG tablet Take 100 mcg by mouth daily before breakfast. Brand Name only     No current facility-administered medications for this  visit.     Review of Systems    She denies chest pain, dyspnea, pnd, orthopnea, n, v, dizziness, syncope, edema, weight gain, or early satiety. All other systems reviewed and are otherwise negative except as noted above.   Physical Exam    VS:  BP 116/84   Pulse (!) 54   Ht 5\' 5"  (1.651 m)   Wt 180 lb 9.6 oz (81.9 kg)   SpO2 98%   BMI 30.05 kg/m  GEN: Well nourished, well developed, in no acute distress. HEENT: normal. Neck: Supple, no JVD, carotid bruits, or masses. Cardiac: RRR, no murmurs, rubs, or gallops. No clubbing, cyanosis, edema.  Radials/DP/PT 2+ and equal bilaterally.  Respiratory:  Respirations regular and unlabored, clear to auscultation bilaterally. GI: Soft, nontender, nondistended, BS + x 4. MS: no deformity or atrophy. Skin: warm and dry, no rash. Neuro:  Strength and sensation are intact. Psych: Normal affect.  Accessory Clinical Findings    ECG personally reviewed by me today -bradycardia, 54 bpm, low voltage QRS, LAD- no acute changes.   Lab Results  Component Value Date   WBC 10.1 06/29/2021   HGB 14.8 06/29/2021   HCT 46.9 (H) 06/29/2021   MCV 95.1 06/29/2021   PLT 334 06/29/2021   Lab Results  Component Value Date   CREATININE 1.22 (H) 06/29/2021   BUN 23 06/29/2021   NA 140 06/29/2021   K 4.0 06/29/2021   CL 107 06/29/2021   CO2 24 06/29/2021   Lab Results  Component Value Date   ALT 21 06/29/2021   AST 25 06/29/2021   ALKPHOS 31 (L) 06/29/2021   BILITOT 0.4 06/29/2021   Lab Results  Component Value Date   CHOL 106 08/27/2021   HDL 43 08/27/2021   LDLCALC 41 08/27/2021   TRIG 121 08/27/2021   CHOLHDL 2.5 08/27/2021    Lab Results  Component Value Date   HGBA1C 6.3 (H) 05/25/2020    Assessment & Plan    1. CAD: S/p NSTEMI in 2021; cath at the time showed 65% mid LAD stenosis, normal FFR. Stable with no anginal symptoms. No indication for ischemic evaluation.  Continue metoprolol, Crestor.  2. Paroxysmal atrial  fibrillation: Maintaining NSR.  She notes infrequent breakthrough episodes of atrial fibrillation of short duration.  Stable.  Continue metoprolol, Eliquis.  3. Hypertension: BP well controlled. Continue current antihypertensive regimen.   4. Hyperlipidemia: LDL was 39 in 07/2022.  Monitor managed per PCP.  Continue Crestor.  5. Disposition: Follow-up in 1 year.     08/2022, NP 08/06/2022, 11:47 AM

## 2022-08-09 ENCOUNTER — Ambulatory Visit
Admission: RE | Admit: 2022-08-09 | Discharge: 2022-08-09 | Disposition: A | Discharge status: Home / Self Care | Payer: Medicare Other | Source: Ambulatory Visit | Attending: Family Medicine | Admitting: Family Medicine

## 2022-08-09 DIAGNOSIS — Z1231 Encounter for screening mammogram for malignant neoplasm of breast: Secondary | ICD-10-CM

## 2023-01-26 ENCOUNTER — Ambulatory Visit
Admission: RE | Admit: 2023-01-26 | Discharge: 2023-01-26 | Disposition: A | Discharge status: Home / Self Care | Payer: 59 | Source: Ambulatory Visit | Attending: Family Medicine | Admitting: Family Medicine

## 2023-01-26 DIAGNOSIS — Z780 Asymptomatic menopausal state: Secondary | ICD-10-CM

## 2023-04-19 ENCOUNTER — Other Ambulatory Visit: Payer: Self-pay

## 2023-04-19 ENCOUNTER — Emergency Department (HOSPITAL_BASED_OUTPATIENT_CLINIC_OR_DEPARTMENT_OTHER): Payer: 59

## 2023-04-19 ENCOUNTER — Emergency Department (HOSPITAL_BASED_OUTPATIENT_CLINIC_OR_DEPARTMENT_OTHER)
Admission: EM | Admit: 2023-04-19 | Discharge: 2023-04-19 | Disposition: A | Discharge status: Home / Self Care | Payer: 59 | Attending: Emergency Medicine | Admitting: Emergency Medicine

## 2023-04-19 ENCOUNTER — Encounter (HOSPITAL_BASED_OUTPATIENT_CLINIC_OR_DEPARTMENT_OTHER): Payer: Self-pay | Admitting: Emergency Medicine

## 2023-04-19 DIAGNOSIS — K439 Ventral hernia without obstruction or gangrene: Secondary | ICD-10-CM

## 2023-04-19 DIAGNOSIS — Z7901 Long term (current) use of anticoagulants: Secondary | ICD-10-CM | POA: Insufficient documentation

## 2023-04-19 DIAGNOSIS — K862 Cyst of pancreas: Secondary | ICD-10-CM | POA: Diagnosis not present

## 2023-04-19 LAB — COMPREHENSIVE METABOLIC PANEL
ALT: 23 U/L (ref 0–44)
AST: 26 U/L (ref 15–41)
Albumin: 3.9 g/dL (ref 3.5–5.0)
Alkaline Phosphatase: 35 U/L — ABNORMAL LOW (ref 38–126)
Anion gap: 8 (ref 5–15)
BUN: 15 mg/dL (ref 8–23)
CO2: 26 mmol/L (ref 22–32)
Calcium: 9.3 mg/dL (ref 8.9–10.3)
Chloride: 106 mmol/L (ref 98–111)
Creatinine, Ser: 0.93 mg/dL (ref 0.44–1.00)
GFR, Estimated: >60 mL/min (ref >60)
Glucose, Bld: 130 mg/dL — ABNORMAL HIGH (ref 70–99)
Potassium: 4.6 mmol/L (ref 3.5–5.1)
Sodium: 140 mmol/L (ref 135–145)
Total Bilirubin: 0.5 mg/dL (ref 0.3–1.2)
Total Protein: 7.4 g/dL (ref 6.5–8.1)

## 2023-04-19 LAB — CBC WITH DIFFERENTIAL/PLATELET
Abs Immature Granulocytes: 0.02 10*3/uL (ref 0.00–0.07)
Basophils Absolute: 0 10*3/uL (ref 0.0–0.1)
Basophils Relative: 1 %
Eosinophils Absolute: 0.1 10*3/uL (ref 0.0–0.5)
Eosinophils Relative: 1 %
HCT: 43.5 % (ref 36.0–46.0)
Hemoglobin: 14.2 g/dL (ref 12.0–15.0)
Immature Granulocytes: 0 %
Lymphocytes Relative: 26 %
Lymphs Abs: 2.1 10*3/uL (ref 0.7–4.0)
MCH: 30.6 pg (ref 26.0–34.0)
MCHC: 32.6 g/dL (ref 30.0–36.0)
MCV: 93.8 fL (ref 80.0–100.0)
Monocytes Absolute: 0.5 10*3/uL (ref 0.1–1.0)
Monocytes Relative: 6 %
Neutro Abs: 5.3 10*3/uL (ref 1.7–7.7)
Neutrophils Relative %: 66 %
Platelets: 260 10*3/uL (ref 150–400)
RBC: 4.64 MIL/uL (ref 3.87–5.11)
RDW: 12.7 % (ref 11.5–15.5)
WBC: 8 10*3/uL (ref 4.0–10.5)
nRBC: 0 % (ref 0.0–0.2)

## 2023-04-19 LAB — LIPASE, BLOOD: Lipase: 35 U/L (ref 11–51)

## 2023-04-19 LAB — LACTIC ACID, PLASMA: Lactic Acid, Venous: 0.8 mmol/L (ref 0.5–1.9)

## 2023-04-19 MED ORDER — IOHEXOL 300 MG/ML  SOLN
100.0000 mL | Freq: Once | INTRAMUSCULAR | Status: AC | PRN
  Administered 2023-04-19: 85 mL via INTRAVENOUS

## 2023-04-19 NOTE — ED Notes (Signed)
Dr. Renaye Rakers request patient stay until patient feels more settled.

## 2023-04-19 NOTE — ED Triage Notes (Signed)
Pt arrives to ED with c/o pain to incisional hernia since this morning. Has seen [facility].

## 2023-04-19 NOTE — ED Provider Triage Note (Signed)
Emergency Medicine Provider Triage Evaluation Note  [name] , a 72 y.o. female  was evaluated in triage.  Pt complains of hernia- rlq/  Patient had onset of painin her hernia today. Did not get better as per ususual with bm. >+ nausea, not emesis/ took tylenol w otu relief. Pain is constant and colicky.  Review of Systems  Positive: RLQ pain  Negative:   Physical Exam  BP 125/68 (BP Location: Right Arm)   Pulse 60   Temp 98.3 F (36.8 C) (Oral)   Resp 18   Ht 5\' 5"  (1.651 m)   Wt 79.4 kg   SpO2 94%   BMI 29.12 kg/m  Gen:   Awake, no distress   Resp:  Normal effort  MSK:   Moves extremities without difficulty  Other:  Larg incisional hearni.a. TT{P firm. No discoloration  Medical Decision Making  Medically screening exam initiated at 3:47 PM.  Appropriate orders placed.  [name] was informed that the remainder of the evaluation will be completed by another provider, this initial triage assessment does not replace that evaluation, and the importance of remaining in the ED until their evaluation is complete.     Arthor Captain, PA-C 04/19/23 1551

## 2023-04-19 NOTE — ED Notes (Signed)
Patient states she feels a lot better and would like to be discharged. Dr. Renaye Rakers states he is now okay with patient leaving to go home.

## 2023-04-19 NOTE — ED Provider Notes (Signed)
[place] EMERGENCY DEPARTMENT AT [facility] Provider Note   CSN: [id number] Arrival date & time: 04/19/23  1513     History  Chief Complaint  Patient presents with   Hernia    [name] is a 72 y.o. female with history of multiple abdominal surgeries presenting to ED with concern for right-sided abdominal hernia.  Patient reports she has had this issue on and off for many months, seen a surgeon, told her that she is not having persistent pain she does not need surgery.  Normally does not bother her.  However over the past 24 hours the bulging more pronounced it was giving her discomfort.  She reports she was moving her bowels.  Denies nausea or vomiting.  HPI     Home Medications Prior to Admission medications   Medication Sig Start Date End Date Taking? Authorizing Provider  acetaminophen (TYLENOL) 500 MG tablet Take 1,000 mg by mouth every 6 (six) hours as needed for mild pain.    [provider]  ALPRAZolam Prudy Feeler) 0.5 MG tablet Take 0.5 mg by mouth daily as needed for anxiety.  03/20/18   [provider]  ARTIFICIAL TEAR OP Place 1 drop into both eyes daily as needed (dry eyes).    [provider]  citalopram (CELEXA) 20 MG tablet Take 20 mg by mouth daily.    [provider]  ELIQUIS 5 MG TABS tablet TAKE 1 TABLET(5 MG) BY MOUTH TWICE DAILY 03/15/22   Lewayne Bunting, MD  metoprolol tartrate (LOPRESSOR) 25 MG tablet Take 1 tablet (25 mg total) by mouth 2 (two) times daily. 05/26/20   Burnadette Pop, MD  Multiple Vitamins-Minerals (CENTRUM SILVER PO) Take 1 tablet by mouth daily.    [provider]  rosuvastatin (CRESTOR) 40 MG tablet Take 1 tablet (40 mg total) by mouth at bedtime. 05/26/20   Burnadette Pop, MD  SYNTHROID 100 MCG tablet Take 100 mcg by mouth daily before breakfast. Brand Name only 10/10/18   [provider]      Allergies    Tape    Review of Systems   Review of Systems  Physical  Exam Updated Vital Signs BP 137/73 (BP Location: Right Arm)   Pulse (!) 50   Temp 98.3 F (36.8 C) (Oral)   Resp 16   Ht 5\' 5"  (1.651 m)   Wt 79.4 kg   SpO2 94%   BMI 29.12 kg/m  Physical Exam Constitutional:      General: She is not in acute distress. HENT:     Head: Normocephalic and atraumatic.  Eyes:     Conjunctiva/sclera: Conjunctivae normal.     Pupils: Pupils are equal, round, and reactive to light.  Cardiovascular:     Rate and Rhythm: Normal rate and regular rhythm.  Pulmonary:     Effort: Pulmonary effort is normal. No respiratory distress.  Abdominal:     General: There is no distension.     Tenderness: There is no abdominal tenderness.     Comments: Soft abdominal hernia to the right of the umbilicus, mildly tender to the touch, no overlying skin changes  Skin:    General: Skin is warm and dry.  Neurological:     General: No focal deficit present.     Mental Status: She is alert. Mental status is at baseline.  Psychiatric:        Mood and Affect: Mood normal.        Behavior: Behavior normal.  ED Results / Procedures / Treatments   Labs (all labs ordered are listed, but only abnormal results are displayed) Labs Reviewed  COMPREHENSIVE METABOLIC PANEL - Abnormal; Notable for the following components:      Result Value   Glucose, Bld 130 (*)    Alkaline Phosphatase 35 (*)    All other components within normal limits  CBC WITH DIFFERENTIAL/PLATELET  LIPASE, BLOOD  LACTIC ACID, PLASMA  URINALYSIS, ROUTINE W REFLEX MICROSCOPIC  LACTIC ACID, PLASMA    EKG None  Radiology CT ABDOMEN PELVIS W CONTRAST  Result Date: 04/19/2023 CLINICAL DATA:  Pain at incision hernia EXAM: CT ABDOMEN AND PELVIS WITH CONTRAST TECHNIQUE: Multidetector CT imaging of the abdomen and pelvis was performed using the standard protocol following bolus administration of intravenous contrast. RADIATION DOSE REDUCTION: This exam was performed according to the departmental  dose-optimization program which includes automated exposure control, adjustment of the mA and/or kV according to patient size and/or use of iterative reconstruction technique. CONTRAST:  85mL OMNIPAQUE IOHEXOL 300 MG/ML  SOLN COMPARISON:  CT abdomen and pelvis dated 06/29/2021 FINDINGS: Lower chest: No focal consolidation or pulmonary nodule in the lung bases. No pleural effusion or pneumothorax demonstrated. Partially imaged heart size is normal. Hepatobiliary: Multifocal hypodensities, likely cysts. No intra or extrahepatic biliary ductal dilation. Cholecystectomy. Pancreas: Increased conspicuity of 1.1 cm hypoattenuating focus within the pancreatic neck (2:22), unchanged in size compared to 06/29/2021. Additional subcentimeter hypodensities within the pancreatic body. No main ductal dilation, mass lesion, or abnormal enhancement. Spleen: Normal in size without focal abnormality. Adrenals/Urinary Tract: No adrenal nodules. Innumerable bilateral renal cysts are again seen, including a hemorrhagic/proteinaceous cyst in the lower pole left kidney measuring 1.6 cm (2:31). No hydronephrosis. Punctate nonobstructing right lower pole stone. No focal bladder wall thickening. Stomach/Bowel: Normal appearance of the stomach. No evidence of bowel wall thickening, distention, or inflammatory changes. Colonic diverticulosis without acute diverticulitis. Appendectomy. Vascular/Lymphatic: Aortic atherosclerosis. Circumaortic left renal vein. No enlarged abdominal or pelvic lymph nodes. Reproductive: No adnexal masses. Other: No free fluid, fluid collection, or free air. Musculoskeletal: No acute or abnormal lytic or blastic osseous lesions. Right spigelian hernia contains fat and a loop of nondilated small bowel. Small fat-containing bilateral inguinal hernias. IMPRESSION: 1. Right Spigelian hernia contains fat and a loop of nondilated small bowel. No abnormal upstream bowel dilation to suggest obstruction. Recommend correlation  with physical examination for strangulation. 2. 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 Recommend follow-up MRI/MRCP in 1 year. 3. Innumerable bilateral renal cysts, including a hemorrhagic/proteinaceous cyst in the lower pole left kidney measuring 1.6 cm. These can be evaluated at the same time on above recommended MRI. 4. Colonic diverticulosis without acute diverticulitis. 5.  Aortic Atherosclerosis (ICD10-I70.0). Electronically Signed   By: Agustin Cree M.D.   On: 04/19/2023 18:39    Procedures Procedures    Medications Ordered in ED Medications  iohexol (OMNIPAQUE) 300 MG/ML solution 100 mL (85 mLs Intravenous Contrast Given 04/19/23 1727)    ED Course/ Medical Decision Making/ A&P                                 Medical Decision Making  Patient presented to ED with abdominal discomfort, consistent with a hernia.  This  may be related to an incisional hernia from her prior abdominal surgeries.  She does not have evidence of obstruction on exam and appears clinically comfortable.  I personally viewed interpreted the patient's labs and imaging.  Blood work was unremarkable.  CT scan was concerning for a right spigelian hernia containing fat and a loop of nondilated small bowel.  There was a conspicuous pancreatic neck hypoattenuating focus, which I discussed with the patient, she likely will need follow-up MRI or MRCP in 1 year.  The patient observed in ED for newly 6 hours.  She report spontaneous resolution of her discomfort, had no evidence of incarceration or strangulation of bowel obstruction on exam.  She preferred to go home and is safe to do so, with follow-up with her general surgeon, whom she will call  tomorrow.  Return precautions were discussed.        Final Clinical Impression(s) / ED Diagnoses Final diagnoses:  Spigelian hernia  Pancreatic cyst    Rx / DC Orders ED Discharge Orders     None         Terald Sleeper, MD 04/19/23 2121

## 2023-04-19 NOTE — ED Notes (Signed)
Report given to the next RN... 

## 2023-04-19 NOTE — ED Notes (Signed)
This nurse enters room to discharge patient and patient was vomiting. Dr. Renaye Rakers notified.

## 2023-04-19 NOTE — Discharge Instructions (Signed)
You will need to follow-up with your general surgeon for your hernia.  If the hernia pain begins getting worse, or you having nausea or vomiting, please return to the emergency department.  We also talked about the incidental finding of CT scan which is multiple cysts along your pancreas.  We discussed the possibility that these could potentially lead to pancreatic cancer.  The recommendation would be to contact your primary care provider office about arranging for an MRI of your pancreas.  This is a better image to take a look at your pancreas.  This can be done as an annual screening every year.

## 2023-04-19 NOTE — ED Notes (Signed)
Pt aware of the need for a urine... Unable to currently provide the sample... 

## 2023-05-09 ENCOUNTER — Encounter (HOSPITAL_COMMUNITY): Payer: Self-pay | Admitting: *Deleted

## 2023-05-09 ENCOUNTER — Inpatient Hospital Stay (HOSPITAL_COMMUNITY)
Admission: EM | Admit: 2023-05-09 | Discharge: 2023-05-13 | DRG: 355 | Disposition: A | Discharge status: Home / Self Care | Payer: 59 | Attending: Family Medicine | Admitting: Family Medicine

## 2023-05-09 ENCOUNTER — Other Ambulatory Visit: Payer: Self-pay

## 2023-05-09 ENCOUNTER — Inpatient Hospital Stay (HOSPITAL_COMMUNITY): Payer: 59

## 2023-05-09 ENCOUNTER — Emergency Department (HOSPITAL_COMMUNITY): Payer: 59

## 2023-05-09 DIAGNOSIS — Z800 Family history of malignant neoplasm of digestive organs: Secondary | ICD-10-CM | POA: Diagnosis not present

## 2023-05-09 DIAGNOSIS — Z8673 Personal history of transient ischemic attack (TIA), and cerebral infarction without residual deficits: Secondary | ICD-10-CM | POA: Diagnosis not present

## 2023-05-09 DIAGNOSIS — K439 Ventral hernia without obstruction or gangrene: Principal | ICD-10-CM

## 2023-05-09 DIAGNOSIS — F32A Depression, unspecified: Secondary | ICD-10-CM | POA: Diagnosis present

## 2023-05-09 DIAGNOSIS — F419 Anxiety disorder, unspecified: Secondary | ICD-10-CM | POA: Diagnosis present

## 2023-05-09 DIAGNOSIS — Z8249 Family history of ischemic heart disease and other diseases of the circulatory system: Secondary | ICD-10-CM

## 2023-05-09 DIAGNOSIS — Z833 Family history of diabetes mellitus: Secondary | ICD-10-CM

## 2023-05-09 DIAGNOSIS — Z803 Family history of malignant neoplasm of breast: Secondary | ICD-10-CM | POA: Diagnosis not present

## 2023-05-09 DIAGNOSIS — Z7901 Long term (current) use of anticoagulants: Secondary | ICD-10-CM

## 2023-05-09 DIAGNOSIS — Z808 Family history of malignant neoplasm of other organs or systems: Secondary | ICD-10-CM

## 2023-05-09 DIAGNOSIS — K432 Incisional hernia without obstruction or gangrene: Principal | ICD-10-CM | POA: Diagnosis present

## 2023-05-09 DIAGNOSIS — I679 Cerebrovascular disease, unspecified: Secondary | ICD-10-CM | POA: Diagnosis present

## 2023-05-09 DIAGNOSIS — Z79899 Other long term (current) drug therapy: Secondary | ICD-10-CM

## 2023-05-09 DIAGNOSIS — K56609 Unspecified intestinal obstruction, unspecified as to partial versus complete obstruction: Secondary | ICD-10-CM | POA: Diagnosis not present

## 2023-05-09 DIAGNOSIS — Z91048 Other nonmedicinal substance allergy status: Secondary | ICD-10-CM | POA: Diagnosis not present

## 2023-05-09 DIAGNOSIS — I480 Paroxysmal atrial fibrillation: Secondary | ICD-10-CM | POA: Diagnosis present

## 2023-05-09 DIAGNOSIS — I2510 Atherosclerotic heart disease of native coronary artery without angina pectoris: Secondary | ICD-10-CM | POA: Diagnosis present

## 2023-05-09 DIAGNOSIS — I252 Old myocardial infarction: Secondary | ICD-10-CM

## 2023-05-09 DIAGNOSIS — K436 Other and unspecified ventral hernia with obstruction, without gangrene: Secondary | ICD-10-CM | POA: Insufficient documentation

## 2023-05-09 DIAGNOSIS — I10 Essential (primary) hypertension: Secondary | ICD-10-CM | POA: Diagnosis present

## 2023-05-09 DIAGNOSIS — E785 Hyperlipidemia, unspecified: Secondary | ICD-10-CM | POA: Diagnosis present

## 2023-05-09 DIAGNOSIS — E119 Type 2 diabetes mellitus without complications: Secondary | ICD-10-CM | POA: Diagnosis present

## 2023-05-09 DIAGNOSIS — Z7983 Long term (current) use of bisphosphonates: Secondary | ICD-10-CM

## 2023-05-09 DIAGNOSIS — E876 Hypokalemia: Secondary | ICD-10-CM | POA: Diagnosis present

## 2023-05-09 DIAGNOSIS — Z79890 Hormone replacement therapy (postmenopausal): Secondary | ICD-10-CM

## 2023-05-09 DIAGNOSIS — E7800 Pure hypercholesterolemia, unspecified: Secondary | ICD-10-CM | POA: Diagnosis not present

## 2023-05-09 DIAGNOSIS — Z66 Do not resuscitate: Secondary | ICD-10-CM | POA: Diagnosis present

## 2023-05-09 DIAGNOSIS — E039 Hypothyroidism, unspecified: Secondary | ICD-10-CM | POA: Diagnosis present

## 2023-05-09 LAB — COMPREHENSIVE METABOLIC PANEL
ALT: 41 U/L (ref 0–44)
AST: 36 U/L (ref 15–41)
Albumin: 4.1 g/dL (ref 3.5–5.0)
Alkaline Phosphatase: 36 U/L — ABNORMAL LOW (ref 38–126)
Anion gap: 10 (ref 5–15)
BUN: 18 mg/dL (ref 8–23)
CO2: 25 mmol/L (ref 22–32)
Calcium: 8.9 mg/dL (ref 8.9–10.3)
Chloride: 102 mmol/L (ref 98–111)
Creatinine, Ser: 0.93 mg/dL (ref 0.44–1.00)
GFR, Estimated: >60 mL/min (ref >60)
Glucose, Bld: 161 mg/dL — ABNORMAL HIGH (ref 70–99)
Potassium: 4 mmol/L (ref 3.5–5.1)
Sodium: 137 mmol/L (ref 135–145)
Total Bilirubin: 0.9 mg/dL (ref 0.3–1.2)
Total Protein: 7.7 g/dL (ref 6.5–8.1)

## 2023-05-09 LAB — CBC WITH DIFFERENTIAL/PLATELET
Abs Immature Granulocytes: 0.03 10*3/uL (ref 0.00–0.07)
Basophils Absolute: 0 10*3/uL (ref 0.0–0.1)
Basophils Relative: 0 %
Eosinophils Absolute: 0.1 10*3/uL (ref 0.0–0.5)
Eosinophils Relative: 1 %
HCT: 46.6 % — ABNORMAL HIGH (ref 36.0–46.0)
Hemoglobin: 14.9 g/dL (ref 12.0–15.0)
Immature Granulocytes: 0 %
Lymphocytes Relative: 13 %
Lymphs Abs: 1.3 10*3/uL (ref 0.7–4.0)
MCH: 30.8 pg (ref 26.0–34.0)
MCHC: 32 g/dL (ref 30.0–36.0)
MCV: 96.3 fL (ref 80.0–100.0)
Monocytes Absolute: 0.3 10*3/uL (ref 0.1–1.0)
Monocytes Relative: 3 %
Neutro Abs: 8.7 10*3/uL — ABNORMAL HIGH (ref 1.7–7.7)
Neutrophils Relative %: 83 %
Platelets: 275 10*3/uL (ref 150–400)
RBC: 4.84 MIL/uL (ref 3.87–5.11)
RDW: 13 % (ref 11.5–15.5)
WBC: 10.4 10*3/uL (ref 4.0–10.5)
nRBC: 0 % (ref 0.0–0.2)

## 2023-05-09 LAB — I-STAT CHEM 8, ED
BUN: 19 mg/dL (ref 8–23)
Calcium, Ion: 1.14 mmol/L — ABNORMAL LOW (ref 1.15–1.40)
Chloride: 102 mmol/L (ref 98–111)
Creatinine, Ser: 0.9 mg/dL (ref 0.44–1.00)
Glucose, Bld: 149 mg/dL — ABNORMAL HIGH (ref 70–99)
HCT: 48 % — ABNORMAL HIGH (ref 36.0–46.0)
Hemoglobin: 16.3 g/dL — ABNORMAL HIGH (ref 12.0–15.0)
Potassium: 4 mmol/L (ref 3.5–5.1)
Sodium: 141 mmol/L (ref 135–145)
TCO2: 25 mmol/L (ref 22–32)

## 2023-05-09 LAB — LACTIC ACID, PLASMA
Lactic Acid, Venous: 1.4 mmol/L (ref 0.5–1.9)
Lactic Acid, Venous: 1.9 mmol/L (ref 0.5–1.9)

## 2023-05-09 MED ORDER — LIDOCAINE HCL URETHRAL/MUCOSAL 2 % EX GEL
1.0000 | Freq: Once | CUTANEOUS | Status: AC
  Administered 2023-05-09: 1 via TOPICAL
  Filled 2023-05-09: qty 10

## 2023-05-09 MED ORDER — FENTANYL CITRATE PF 50 MCG/ML IJ SOSY
50.0000 ug | PREFILLED_SYRINGE | Freq: Once | INTRAMUSCULAR | Status: AC
  Administered 2023-05-09: 50 ug via INTRAVENOUS
  Filled 2023-05-09: qty 1

## 2023-05-09 MED ORDER — ONDANSETRON HCL 4 MG/2ML IJ SOLN: 4.0000 mg | Freq: Four times a day (QID) | INTRAMUSCULAR | Status: DC | PRN

## 2023-05-09 MED ORDER — MORPHINE SULFATE (PF) 2 MG/ML IV SOLN
2.0000 mg | INTRAVENOUS | Status: DC | PRN
  Administered 2023-05-11: 2 mg via INTRAVENOUS
  Filled 2023-05-09: qty 1

## 2023-05-09 MED ORDER — ONDANSETRON HCL 4 MG/2ML IJ SOLN
4.0000 mg | Freq: Once | INTRAMUSCULAR | Status: AC
  Administered 2023-05-09: 4 mg via INTRAVENOUS
  Filled 2023-05-09: qty 2

## 2023-05-09 MED ORDER — OXYCODONE HCL 5 MG PO TABS
5.0000 mg | ORAL_TABLET | ORAL | Status: DC | PRN
  Administered 2023-05-12 (×2): 5 mg via ORAL
  Filled 2023-05-09 (×2): qty 1

## 2023-05-09 MED ORDER — LACTATED RINGERS IV BOLUS
1000.0000 mL | Freq: Once | INTRAVENOUS | Status: AC
  Administered 2023-05-09: 1000 mL via INTRAVENOUS

## 2023-05-09 MED ORDER — IOHEXOL 300 MG/ML  SOLN
100.0000 mL | Freq: Once | INTRAMUSCULAR | Status: AC | PRN
  Administered 2023-05-09: 100 mL via INTRAVENOUS

## 2023-05-09 MED ORDER — SODIUM CHLORIDE 0.9 % IV SOLN: INTRAVENOUS | Status: DC

## 2023-05-09 MED ORDER — ONDANSETRON HCL 4 MG PO TABS: 4.0000 mg | ORAL_TABLET | Freq: Four times a day (QID) | ORAL | Status: DC | PRN

## 2023-05-09 MED ORDER — METOPROLOL TARTRATE 5 MG/5ML IV SOLN: 5.0000 mg | Freq: Four times a day (QID) | INTRAVENOUS | Status: DC | PRN

## 2023-05-09 MED ORDER — ACETAMINOPHEN 325 MG PO TABS: 650.0000 mg | ORAL_TABLET | Freq: Four times a day (QID) | ORAL | Status: DC | PRN

## 2023-05-09 MED ORDER — ACETAMINOPHEN 650 MG RE SUPP: 650.0000 mg | Freq: Four times a day (QID) | RECTAL | Status: DC | PRN

## 2023-05-09 NOTE — Progress Notes (Addendum)
[facility]  Reviewed imaging and prior imaging. Wide mouth spigelian type hernia versus incisional from her appendectomy?  Incarcerated but risk of strangulation low given the wide mouth and VS, labs, pain reported.  NPO, NG Dr. Audrie Lia will attempt reduction Admit to hospitalist Hold Eliquis  Will plan for hernia repair tomorrow. Reported to have taken Eliquis yesterday, so tomorrow will be off 48 hours.   Michaela Greenhouse, MD [name] [address] [name] [name], [place] [address] [phone] (office)

## 2023-05-09 NOTE — ED Triage Notes (Signed)
Pt c/o right side groin pain; pt states she was seen at [facility] a couple of weeks ago for same complaint and was told to follow up with surgeon and use ice  Pt states now she feels nauseous and feels like she is going to pass out

## 2023-05-09 NOTE — ED Notes (Signed)
Portable Xray at bedside.

## 2023-05-09 NOTE — ED Notes (Signed)
ED TO INPATIENT HANDOFF REPORT  ED Nurse Name and Phone #: Jacques Earthly Name/Age/Gender [name] 72 y.o. female Room/Bed: APA10/APA10  Code Status   Code Status: Full Code  Home/SNF/Other Home Patient oriented to: self, place, time, and situation Is this baseline? Yes   Triage Complete: Triage complete  Chief Complaint SBO (small bowel obstruction) (HCC) [K56.609]  Triage Note Pt c/o right side groin pain; pt states she was seen at [facility] a couple of weeks ago for same complaint and was told to follow up with surgeon and use ice  Pt states now she feels nauseous and feels like she is going to pass out   Allergies Allergies  Allergen Reactions   Tape Rash    Can only tolerate LIMITED exposure; no "plastic tape," please   Wound Dressing Adhesive Rash    Can only tolerate LIMITED exposure; no "plastic tape," please    Level of Care/Admitting Diagnosis ED Disposition     ED Disposition  Admit   Condition  --   Comment  Hospital Area: [facility] [[id number]]  Level of Care: Med-Surg [16]  Covid Evaluation: Asymptomatic - no recent exposure (last 10 days) testing not required  Diagnosis: SBO (small bowel obstruction) [facility] [[id number]]  Admitting Physician: Lilyan Gilford [[id number]]  Attending Physician: Lilyan Gilford [id number]  Certification:: I certify this patient will need inpatient services for at least 2 midnights  Expected Medical Readiness: 05/11/2023          B Medical/Surgery History Past Medical History:  Diagnosis Date   Depression    Diabetes mellitus without complication (HCC)    Hyperlipidemia 10/19/2018   Hypertension    Hypothyroidism    Stroke (HCC) 10/19/2018   Past Surgical History:  Procedure Laterality Date   APPENDECTOMY     BACK SURGERY     CHOLECYSTECTOMY     CORONARY PRESSURE/FFR STUDY N/A 05/26/2020   Procedure: INTRAVASCULAR PRESSURE WIRE/FFR STUDY;  Surgeon: Swaziland, Peter M, MD;  Location: [facility] INVASIVE CV  LAB;  Service: Cardiovascular; iFR/FFR of 65% mLAD -  iFR (0.93) and FFR (0.84) = NOT HEMODYNAMICALLY/ PHYSIOLOGICALLY SIGNIFICANT.    LEFT HEART CATH AND CORONARY ANGIOGRAPHY N/A 05/26/2020   Procedure: LEFT HEART CATH AND CORONARY ANGIOGRAPHY;  Surgeon: Swaziland, Peter M, MD;  Location: [facility] INVASIVE CV LAB;  Service: Cardiovascular; non-STEMI = Mid LAD 65%.  FFR and IFR both negative for hemodynamic significance.  Mildly elevated LVEDP. ->  Recommend medical management.   TRANSTHORACIC ECHOCARDIOGRAM  05/24/2020   In setting of non-STEMI: EF 60 to 65%.  No R WMA.  "Normal "diastolic parameters.  Normal RV.  Mild MR.  Mild aortic valve sclerosis without stenosis.  (Poor quality)   VAGINAL HYSTERECTOMY       A IV Location/Drains/Wounds Patient Lines/Drains/Airways Status     Active Line/Drains/Airways     Name Placement date Placement time Site Days   Peripheral IV 05/09/23 20 G Anterior;Proximal;Right Forearm 05/09/23  1750  Forearm  less than 1   NG/OG Vented/Dual Lumen 16 Fr. Right nare 05/09/23  2157  Right nare  less than 1            Intake/Output Last 24 hours No intake or output data in the 24 hours ending 05/09/23 2207  Labs/Imaging Results for orders placed or performed during the hospital encounter of 05/09/23 (from the past 48 hour(s))  Comprehensive metabolic panel     Status: Abnormal   Collection Time: 05/09/23  5:50 PM  Result Value Ref Range   Sodium 137 135 - 145 mmol/L   Potassium 4.0 3.5 - 5.1 mmol/L   Chloride 102 98 - 111 mmol/L   CO2 25 22 - 32 mmol/L   Glucose, Bld 161 (H) 70 - 99 mg/dL    Comment: Glucose reference range applies only to samples taken after fasting for at least 8 hours.   BUN 18 8 - 23 mg/dL   Creatinine, Ser 4.69 0.44 - 1.00 mg/dL   Calcium 8.9 8.9 - 62.9 mg/dL   Total Protein 7.7 6.5 - 8.1 g/dL   Albumin 4.1 3.5 - 5.0 g/dL   AST 36 15 - 41 U/L   ALT 41 0 - 44 U/L   Alkaline Phosphatase 36 (L) 38 - 126 U/L   Total Bilirubin 0.9 0.3 -  1.2 mg/dL   GFR, Estimated >52 >84 mL/min    Comment: (NOTE) Calculated using the CKD-EPI Creatinine Equation (2021)    Anion gap 10 5 - 15    Comment: Performed at [facility], [address], [place], [place] [address]  CBC with Differential     Status: Abnormal   Collection Time: 05/09/23  5:50 PM  Result Value Ref Range   WBC 10.4 4.0 - 10.5 K/uL   RBC 4.84 3.87 - 5.11 MIL/uL   Hemoglobin 14.9 12.0 - 15.0 g/dL   HCT 01.0 (H) 27.2 - 53.6 %   MCV 96.3 80.0 - 100.0 fL   MCH 30.8 26.0 - 34.0 pg   MCHC 32.0 30.0 - 36.0 g/dL   RDW 64.4 03.4 - 74.2 %   Platelets 275 150 - 400 K/uL   nRBC 0.0 0.0 - 0.2 %   Neutrophils Relative % 83 %   Neutro Abs 8.7 (H) 1.7 - 7.7 K/uL   Lymphocytes Relative 13 %   Lymphs Abs 1.3 0.7 - 4.0 K/uL   Monocytes Relative 3 %   Monocytes Absolute 0.3 0.1 - 1.0 K/uL   Eosinophils Relative 1 %   Eosinophils Absolute 0.1 0.0 - 0.5 K/uL   Basophils Relative 0 %   Basophils Absolute 0.0 0.0 - 0.1 K/uL   Immature Granulocytes 0 %   Abs Immature Granulocytes 0.03 0.00 - 0.07 K/uL    Comment: Performed at [facility], [address], [place], [place] [address]  Lactic acid, plasma     Status: None   Collection Time: 05/09/23  5:50 PM  Result Value Ref Range   Lactic Acid, Venous 1.4 0.5 - 1.9 mmol/L    Comment: Performed at [facility], [address], [place], [place] [address]  I-stat chem 8, ED (not at [facility], [facility] or [facility])     Status: Abnormal   Collection Time: 05/09/23  5:57 PM  Result Value Ref Range   Sodium 141 135 - 145 mmol/L   Potassium 4.0 3.5 - 5.1 mmol/L   Chloride 102 98 - 111 mmol/L   BUN 19 8 - 23 mg/dL   Creatinine, Ser 3.32 0.44 - 1.00 mg/dL   Glucose, Bld 951 (H) 70 - 99 mg/dL    Comment: Glucose reference range applies only to samples taken after fasting for at least 8 hours.   Calcium, Ion 1.14 (L) 1.15 - 1.40 mmol/L   TCO2 25 22 - 32 mmol/L   Hemoglobin 16.3 (H) 12.0 - 15.0 g/dL   HCT 88.4 (H) 16.6 - 06.3 %  Lactic acid, plasma      Status: None   Collection Time: 05/09/23  7:21  PM  Result Value Ref Range   Lactic Acid, Venous 1.9 0.5 - 1.9 mmol/L    Comment: Performed at [facility], [address], [place], [place] [address]   CT ABDOMEN PELVIS W CONTRAST  Result Date: 05/09/2023 CLINICAL DATA:  Right groin pain, known hernia EXAM: CT ABDOMEN AND PELVIS WITH CONTRAST TECHNIQUE: Multidetector CT imaging of the abdomen and pelvis was performed using the standard protocol following bolus administration of intravenous contrast. RADIATION DOSE REDUCTION: This exam was performed according to the departmental dose-optimization program which includes automated exposure control, adjustment of the mA and/or kV according to patient size and/or use of iterative reconstruction technique. CONTRAST:  OMNIPAQUE IOHEXOL 300 MG/ML  SOLN COMPARISON:  04/19/2023 FINDINGS: Lower chest: Bilateral lower lobe scarring/atelectasis. Hepatobiliary: Scattered hepatic cysts measuring up to 15 mm inferiorly in the left hepatic lobe (series 2/image 29). Status post cholecystectomy. No intrahepatic ductal dilatation. Dilated common duct, measuring 12 mm centrally, and smoothly tapering at the ampulla, likely postsurgical. Pancreas: 11 mm unilocular cyst along the inferior aspect of the pancreatic body (series 2/image 28), likely a small pseudocyst or side branch IPMN. No associated parenchymal atrophy or main pancreatic ductal dilatation. Spleen: Within normal limits Adrenals/Urinary Tract: Adrenal glands are within normal limits. Multiple bilateral renal cysts, most of which measure simple fluid density, measuring to 2.7 cm the posterior left lower kidney (series 2/image 31), benign (Bosniak I). Additional 1.8 cm hyperdense lesion in the anterior right upper kidney (series 2/image 24) and 1.9 cm hyperdense lesion in the left lower renal sinus (series 2/image 36), indeterminate but favoring a benign hemorrhagic cysts. No renal, ureteral, or bladder calculi.   No hydronephrosis. Bladder is within normal limits. Stomach/Bowel: Stomach is notable for a 14 mm fat density lesion along the greater curvature of the stomach (series 2/image 17), suggesting a small lipoma. Dilated loops of small bowel in the left mid/lower abdomen (series 2/image 87), with transition entering a moderate right spigelian hernia (series 2/image 84), compatible with small bowel obstruction. Decompressed loops of distal small bowel within and exiting the hernia, with mild mesenteric stranding (series 2/image 60). No pneumatosis or free air. Appendix is not discretely visualized. Scattered colonic diverticulosis, without evidence of diverticulitis. Vascular/Lymphatic: No evidence of abdominal aortic aneurysm. Atherosclerotic calcifications of the abdominal aorta and branch vessels. No suspicious abdominopelvic lymphadenopathy. Reproductive: Status post hysterectomy. Bilateral ovaries are within normal limits. Other: Small fat containing bilateral inguinal hernias (series 2/image 80). Musculoskeletal: Mild degenerative changes of the lower thoracic spine. IMPRESSION: Small bowel obstruction on the basis of a moderate right spigelian hernia. Mild mesenteric stranding within the hernia, raising concern for incarceration. No pneumatosis or free air. Surgical consultation is suggested. 11 mm unilocular cyst along the inferior aspect of the pancreatic body, likely a small pseudocyst or side branch IPMN. Follow-up MRI abdomen with/without contrast is suggested in 2 years. Additional ancillary findings as above. Electronically Signed   By: Charline Bills M.D.   On: 05/09/2023 20:09    Pending Labs [employer] (From admission, onward)     Start     Ordered   Signed and Held  HIV Antibody (routine testing w rflx)  (HIV Antibody (Routine testing w reflex) panel)  Once,   R        Signed and Held   Signed and Held  Comprehensive metabolic panel  Tomorrow morning,   R        Signed and Held   Signed  and Held  Magnesium  Tomorrow morning,   R        Signed and Held   Signed and Held  CBC with Differential/Platelet  Tomorrow morning,   R        Signed and Held            Vitals/Pain Today's Vitals   05/09/23 2100 05/09/23 2115 05/09/23 2130 05/09/23 2200  BP: 123/66  123/63 125/62  Pulse: 75  71 69  Resp:   15 17  Temp:      TempSrc:      SpO2: 95%  90% 93%  Weight:      Height:      PainSc:  0-No pain      Isolation Precautions No active isolations  Medications Medications  lactated ringers bolus 1,000 mL (0 mLs Intravenous Stopped 05/09/23 2021)  fentaNYL (SUBLIMAZE) injection 50 mcg (50 mcg Intravenous Given 05/09/23 1753)  ondansetron (ZOFRAN) injection 4 mg (4 mg Intravenous Given 05/09/23 1752)  iohexol (OMNIPAQUE) 300 MG/ML solution 100 mL (100 mLs Intravenous Contrast Given 05/09/23 1825)  lactated ringers bolus 1,000 mL (0 mLs Intravenous Stopped 05/09/23 2155)  fentaNYL (SUBLIMAZE) injection 50 mcg (50 mcg Intravenous Given 05/09/23 2047)  lidocaine (XYLOCAINE) 2 % jelly 1 Application (1 Application Topical Given 05/09/23 2155)    Mobility walks     Focused Assessments    R Recommendations: See Admitting Provider Note  Report given to:   Additional Notes: A&O; Ambu with assist; 20G RAC; 23F NG R nare(pending X-ray results)

## 2023-05-09 NOTE — ED Provider Notes (Signed)
[place] EMERGENCY DEPARTMENT AT [facility] Provider Note  CSN: [id number] Arrival date & time: 05/09/23 1605  Chief Complaint(s) Hernia  HPI [name] is a 72 y.o. 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  She states that she had been doing well since discharge from the ER on 04/19/2023 but over the last 24 hours has developed severe worsening of her right upper and right lower quadrant pain with vomiting.  She has been unable to tolerate p.o. because of the pain and vomiting.  Denies chest pain, shortness of breath, headache, fever or other systemic symptoms.   Past Medical History Past Medical History:  Diagnosis Date   Depression    Diabetes mellitus without complication (HCC)    Hyperlipidemia 10/19/2018   Hypertension    Hypothyroidism    Stroke (HCC) 10/19/2018   Patient Active Problem List   Diagnosis Date Noted   Paroxysmal atrial fibrillation (HCC) 12/26/2020   NSTEMI (non-ST elevated myocardial infarction) (HCC) 05/24/2020   Hyperlipidemia 10/19/2018   TIA (transient ischemic attack) 10/18/2018   Depression 10/18/2018   Atypical chest pain 02/09/2013   Diabetes type 2, controlled (HCC) 02/09/2013   Hypertension 02/09/2013   Home Medication(s) Prior to Admission medications   Medication Sig Start Date End Date Taking? Authorizing Provider  acetaminophen (TYLENOL) 500 MG tablet Take 1,000 mg by mouth every 6 (six) hours as needed for mild pain.    [provider]  ALPRAZolam Prudy Feeler) 0.5 MG tablet Take 0.5 mg by mouth daily as needed for anxiety.  03/20/18   [provider]  ARTIFICIAL TEAR OP Place 1 drop into both eyes daily as needed (dry eyes).    [provider]  citalopram (CELEXA) 20 MG tablet Take 20 mg by mouth daily.    [provider]   ELIQUIS 5 MG TABS tablet TAKE 1 TABLET(5 MG) BY MOUTH TWICE DAILY 03/15/22   Lewayne Bunting, MD  metoprolol tartrate (LOPRESSOR) 25 MG tablet Take 1 tablet (25 mg total) by mouth 2 (two) times daily. 05/26/20   Burnadette Pop, MD  Multiple Vitamins-Minerals (CENTRUM SILVER PO) Take 1 tablet by mouth daily.    [provider]  rosuvastatin (CRESTOR) 40 MG tablet Take 1 tablet (40 mg total) by mouth at bedtime. 05/26/20   Burnadette Pop, MD  SYNTHROID 100 MCG tablet Take 100 mcg by mouth daily before breakfast. Brand Name only 10/10/18   [provider]                                                                                                                                    Past Surgical History Past Surgical History:  Procedure Laterality Date   APPENDECTOMY     BACK SURGERY     CHOLECYSTECTOMY  CORONARY PRESSURE/FFR STUDY N/A 05/26/2020   Procedure: INTRAVASCULAR PRESSURE WIRE/FFR STUDY;  Surgeon: Swaziland, Peter M, MD;  Location: [facility] INVASIVE CV LAB;  Service: Cardiovascular; iFR/FFR of 65% mLAD -  iFR (0.93) and FFR (0.84) = NOT HEMODYNAMICALLY/ PHYSIOLOGICALLY SIGNIFICANT.    LEFT HEART CATH AND CORONARY ANGIOGRAPHY N/A 05/26/2020   Procedure: LEFT HEART CATH AND CORONARY ANGIOGRAPHY;  Surgeon: Swaziland, Peter M, MD;  Location: [facility] INVASIVE CV LAB;  Service: Cardiovascular; non-STEMI = Mid LAD 65%.  FFR and IFR both negative for hemodynamic significance.  Mildly elevated LVEDP. ->  Recommend medical management.   TRANSTHORACIC ECHOCARDIOGRAM  05/24/2020   In setting of non-STEMI: EF 60 to 65%.  No R WMA.  "Normal "diastolic parameters.  Normal RV.  Mild MR.  Mild aortic valve sclerosis without stenosis.  (Poor quality)   VAGINAL HYSTERECTOMY     Family History Family History  Problem Relation Age of Onset   Coronary artery disease Mother        stent age 59, died at age 63 of complications from diabetes/heart disease   Diabetes Mother    Colon cancer Father         died at 42   Breast cancer Sister        37s   Breast cancer Daughter 86   Throat cancer Brother    Arrhythmia Brother     Social History Social History   Tobacco Use   Smoking status: Never   Smokeless tobacco: Never  Substance Use Topics   Alcohol use: No   Drug use: No   Allergies Tape  Review of Systems Review of Systems  Gastrointestinal:  Positive for abdominal pain, nausea and vomiting.    Physical Exam Vital Signs  I have reviewed the triage vital signs BP (!) 80/55 (BP Location: Left Arm)   Pulse (!) 51   Temp (!) 97.5 F (36.4 C) (Oral)   Resp 16   Ht 5\' 5"  (1.651 m)   Wt 79.3 kg   SpO2 97%   BMI 29.09 kg/m   Physical Exam Vitals and nursing note reviewed.  Constitutional:      General: She is not in acute distress.    Appearance: She is well-developed. She is ill-appearing.  HENT:     Head: Normocephalic and atraumatic.  Eyes:     Conjunctiva/sclera: Conjunctivae normal.  Cardiovascular:     Rate and Rhythm: Normal rate and regular rhythm.     Heart sounds: No murmur heard. Pulmonary:     Effort: Pulmonary effort is normal. No respiratory distress.     Breath sounds: Normal breath sounds.  Abdominal:     Palpations: Abdomen is soft.     Tenderness: There is abdominal tenderness.  Musculoskeletal:        General: No swelling.     Cervical back: Neck supple.  Skin:    General: Skin is warm and dry.     Capillary Refill: Capillary refill takes less than 2 seconds.  Neurological:     Mental Status: She is alert.  Psychiatric:        Mood and Affect: Mood normal.     ED Results and Treatments Labs (all labs ordered are listed, but only abnormal results are displayed) Labs Reviewed  COMPREHENSIVE METABOLIC PANEL  CBC WITH DIFFERENTIAL/PLATELET  LACTIC ACID, PLASMA  LACTIC ACID, PLASMA  Radiology No results  found.  Pertinent labs & imaging results that were available during my care of the patient were reviewed by me and considered in my medical decision making (see MDM for details).  Medications Ordered in ED Medications  lactated ringers bolus 1,000 mL (has no administration in time range)  fentaNYL (SUBLIMAZE) injection 50 mcg (has no administration in time range)  ondansetron (ZOFRAN) injection 4 mg (has no administration in time range)                                                                                                                                     Procedures Procedures  (including critical care time)  Medical Decision Making / ED Course   This patient presents to the ED for concern of ***, this involves an extensive number of treatment options, and is a complaint that carries with it a high risk of complications and morbidity.  The differential diagnosis includes ***  MDM: ***   Additional history obtained: -Additional history obtained from *** -External records from outside source obtained and reviewed including: Chart review including previous notes, labs, imaging, consultation notes   Lab Tests: -I ordered, reviewed, and interpreted labs.   The pertinent results include:   Labs Reviewed  COMPREHENSIVE METABOLIC PANEL  CBC WITH DIFFERENTIAL/PLATELET  LACTIC ACID, PLASMA  LACTIC ACID, PLASMA      EKG ***  EKG Interpretation Date/Time:    Ventricular Rate:    PR Interval:    QRS Duration:    QT Interval:    QTC Calculation:   R Axis:      Text Interpretation:           Imaging Studies ordered: I ordered imaging studies including *** I independently visualized and interpreted imaging. I agree with the radiologist interpretation   Medicines ordered and prescription drug management: Meds ordered this encounter  Medications   lactated ringers bolus 1,000 mL   fentaNYL (SUBLIMAZE) injection 50 mcg   ondansetron (ZOFRAN) injection 4 mg     -I have reviewed the patients home medicines and have made adjustments as needed  Critical interventions ***  Consultations Obtained: I requested consultation with the ***,  and discussed lab and imaging findings as well as pertinent plan - they recommend: ***   Cardiac Monitoring: The patient was maintained on a cardiac monitor.  I personally viewed and interpreted the cardiac monitored which showed an underlying rhythm of: ***  Social Determinants of Health:  Factors impacting patients care include: ***   Reevaluation: After the interventions noted above, I reevaluated the patient and found that they have :{resolved/improved/worsened:23923::"improved"}  Co morbidities that complicate the patient evaluation  Past Medical History:  Diagnosis Date   Depression    Diabetes mellitus without complication (HCC)    Hyperlipidemia 10/19/2018   Hypertension    Hypothyroidism    Stroke (HCC) 10/19/2018      Dispostion: I considered  admission for this patient, ***     Final Clinical Impression(s) / ED Diagnoses Final diagnoses:  None     @PCDICTATION @

## 2023-05-10 DIAGNOSIS — I10 Essential (primary) hypertension: Secondary | ICD-10-CM

## 2023-05-10 DIAGNOSIS — K439 Ventral hernia without obstruction or gangrene: Secondary | ICD-10-CM | POA: Diagnosis not present

## 2023-05-10 DIAGNOSIS — F32A Depression, unspecified: Secondary | ICD-10-CM | POA: Diagnosis not present

## 2023-05-10 DIAGNOSIS — I480 Paroxysmal atrial fibrillation: Secondary | ICD-10-CM

## 2023-05-10 DIAGNOSIS — K56609 Unspecified intestinal obstruction, unspecified as to partial versus complete obstruction: Secondary | ICD-10-CM

## 2023-05-10 DIAGNOSIS — E7800 Pure hypercholesterolemia, unspecified: Secondary | ICD-10-CM

## 2023-05-10 DIAGNOSIS — K436 Other and unspecified ventral hernia with obstruction, without gangrene: Secondary | ICD-10-CM | POA: Insufficient documentation

## 2023-05-10 LAB — CBC WITH DIFFERENTIAL/PLATELET
Abs Immature Granulocytes: 0.03 10*3/uL (ref 0.00–0.07)
Basophils Absolute: 0 10*3/uL (ref 0.0–0.1)
Basophils Relative: 0 %
Eosinophils Absolute: 0 10*3/uL (ref 0.0–0.5)
Eosinophils Relative: 0 %
HCT: 43.3 % (ref 36.0–46.0)
Hemoglobin: 13.5 g/dL (ref 12.0–15.0)
Immature Granulocytes: 0 %
Lymphocytes Relative: 28 %
Lymphs Abs: 3.2 10*3/uL (ref 0.7–4.0)
MCH: 30.1 pg (ref 26.0–34.0)
MCHC: 31.2 g/dL (ref 30.0–36.0)
MCV: 96.4 fL (ref 80.0–100.0)
Monocytes Absolute: 0.8 10*3/uL (ref 0.1–1.0)
Monocytes Relative: 7 %
Neutro Abs: 7.1 10*3/uL (ref 1.7–7.7)
Neutrophils Relative %: 65 %
Platelets: 251 10*3/uL (ref 150–400)
RBC: 4.49 MIL/uL (ref 3.87–5.11)
RDW: 12.8 % (ref 11.5–15.5)
WBC: 11.2 10*3/uL — ABNORMAL HIGH (ref 4.0–10.5)
nRBC: 0 % (ref 0.0–0.2)

## 2023-05-10 LAB — GLUCOSE, CAPILLARY
Glucose-Capillary: 124 mg/dL — ABNORMAL HIGH (ref 70–99)
Glucose-Capillary: 129 mg/dL — ABNORMAL HIGH (ref 70–99)

## 2023-05-10 LAB — COMPREHENSIVE METABOLIC PANEL
ALT: 33 U/L (ref 0–44)
AST: 29 U/L (ref 15–41)
Albumin: 3.4 g/dL — ABNORMAL LOW (ref 3.5–5.0)
Alkaline Phosphatase: 31 U/L — ABNORMAL LOW (ref 38–126)
Anion gap: 12 (ref 5–15)
BUN: 15 mg/dL (ref 8–23)
CO2: 25 mmol/L (ref 22–32)
Calcium: 8.3 mg/dL — ABNORMAL LOW (ref 8.9–10.3)
Chloride: 101 mmol/L (ref 98–111)
Creatinine, Ser: 0.84 mg/dL (ref 0.44–1.00)
GFR, Estimated: >60 mL/min (ref >60)
Glucose, Bld: 103 mg/dL — ABNORMAL HIGH (ref 70–99)
Potassium: 3.6 mmol/L (ref 3.5–5.1)
Sodium: 138 mmol/L (ref 135–145)
Total Bilirubin: 0.7 mg/dL (ref 0.3–1.2)
Total Protein: 6.7 g/dL (ref 6.5–8.1)

## 2023-05-10 LAB — MAGNESIUM: Magnesium: 1.9 mg/dL (ref 1.7–2.4)

## 2023-05-10 LAB — HIV ANTIBODY (ROUTINE TESTING W REFLEX): HIV Screen 4th Generation wRfx: NONREACTIVE

## 2023-05-10 LAB — SURGICAL PCR SCREEN
MRSA, PCR: NEGATIVE
Staphylococcus aureus: NEGATIVE

## 2023-05-10 MED ORDER — IPRATROPIUM-ALBUTEROL 0.5-2.5 (3) MG/3ML IN SOLN: 3.0000 mL | RESPIRATORY_TRACT | Status: DC | PRN

## 2023-05-10 MED ORDER — GUAIFENESIN 100 MG/5ML PO LIQD: 5.0000 mL | ORAL | Status: DC | PRN

## 2023-05-10 MED ORDER — PHENOL 1.4 % MT LIQD
1.0000 | OROMUCOSAL | Status: DC | PRN
  Administered 2023-05-10: 1 via OROMUCOSAL
  Filled 2023-05-10: qty 177

## 2023-05-10 MED ORDER — CHLORHEXIDINE GLUCONATE CLOTH 2 % EX PADS
6.0000 | MEDICATED_PAD | Freq: Once | CUTANEOUS | Status: AC
  Administered 2023-05-10: 6 via TOPICAL

## 2023-05-10 MED ORDER — SENNOSIDES-DOCUSATE SODIUM 8.6-50 MG PO TABS: 1.0000 | ORAL_TABLET | Freq: Every evening | ORAL | Status: DC | PRN

## 2023-05-10 MED ORDER — CEFAZOLIN SODIUM-DEXTROSE 2-4 GM/100ML-% IV SOLN
2.0000 g | INTRAVENOUS | Status: AC
  Administered 2023-05-11: 2 g via INTRAVENOUS
  Filled 2023-05-10: qty 100

## 2023-05-10 MED ORDER — LORAZEPAM 2 MG/ML IJ SOLN
0.5000 mg | INTRAMUSCULAR | Status: AC | PRN
  Administered 2023-05-10 (×2): 0.5 mg via INTRAVENOUS
  Filled 2023-05-10 (×2): qty 1

## 2023-05-10 MED ORDER — METOPROLOL TARTRATE 5 MG/5ML IV SOLN: 5.0000 mg | INTRAVENOUS | Status: DC | PRN

## 2023-05-10 MED ORDER — TRAZODONE HCL 50 MG PO TABS: 50.0000 mg | ORAL_TABLET | Freq: Every evening | ORAL | Status: DC | PRN

## 2023-05-10 MED ORDER — POTASSIUM CHLORIDE 10 MEQ/100ML IV SOLN
10.0000 meq | INTRAVENOUS | Status: AC
  Administered 2023-05-10 (×3): 10 meq via INTRAVENOUS
  Filled 2023-05-10: qty 100

## 2023-05-10 MED ORDER — HYDRALAZINE HCL 20 MG/ML IJ SOLN: 10.0000 mg | INTRAMUSCULAR | Status: DC | PRN

## 2023-05-10 MED ORDER — GLUCAGON HCL RDNA (DIAGNOSTIC) 1 MG IJ SOLR: 1.0000 mg | INTRAMUSCULAR | Status: DC | PRN

## 2023-05-10 MED ORDER — DEXTROSE-SODIUM CHLORIDE 5-0.45 % IV SOLN: INTRAVENOUS | Status: AC

## 2023-05-10 NOTE — Consult Note (Signed)
[facility] Consult  Reason for Consult: Incarcerated ventral hernia  Referring Physician: Dr. Audrie Lia   Chief Complaint   Hernia     HPI: [name] is a 72 y.o. female with a hernia she has known about since labor after presenting with abdominal pain and vomiting. She was discharged from the ED and started having worsening pain in the last 24 hours and some nausea and 1 episode of vomiting. She had NG placed and CT scan with dilated bowel going into the hernia. I was consulted and discussed admission to the hospitalist with plans for surgery after her Eliquis wears off. She reports taking it yesterday AM. Her abdomen is not hurting and she says she is not nauseated. She has only had 300 out since the NG was placed. She had a prior open cholecystectomy and a appendectomy through the same incision. She says that the lower incision on the right lower abdomen was from a drain tube placed at the time of that surgery 40 years ago.  Past Medical History:  Diagnosis Date   Depression    Diabetes mellitus without complication (HCC)    Hyperlipidemia 10/19/2018   Hypertension    Hypothyroidism    Stroke (HCC) 10/19/2018    Past Surgical History:  Procedure Laterality Date   APPENDECTOMY     BACK SURGERY     CHOLECYSTECTOMY     CORONARY PRESSURE/FFR STUDY N/A 05/26/2020   Procedure: INTRAVASCULAR PRESSURE WIRE/FFR STUDY;  Surgeon: Swaziland, Peter M, MD;  Location: [facility] INVASIVE CV LAB;  Service: Cardiovascular; iFR/FFR of 65% mLAD -  iFR (0.93) and FFR (0.84) = NOT HEMODYNAMICALLY/ PHYSIOLOGICALLY SIGNIFICANT.    LEFT HEART CATH AND CORONARY ANGIOGRAPHY N/A 05/26/2020   Procedure: LEFT HEART CATH AND CORONARY ANGIOGRAPHY;  Surgeon: Swaziland, Peter M, MD;  Location: [facility] INVASIVE CV LAB;  Service: Cardiovascular; non-STEMI = Mid LAD 65%.  FFR and IFR both negative for hemodynamic significance.  Mildly elevated LVEDP. ->  Recommend medical management.   TRANSTHORACIC ECHOCARDIOGRAM   05/24/2020   In setting of non-STEMI: EF 60 to 65%.  No R WMA.  "Normal "diastolic parameters.  Normal RV.  Mild MR.  Mild aortic valve sclerosis without stenosis.  (Poor quality)   VAGINAL HYSTERECTOMY      Family History  Problem Relation Age of Onset   Coronary artery disease Mother        stent age 67, died at age 58 of complications from diabetes/heart disease   Diabetes Mother    Colon cancer Father        died at 54   Breast cancer Sister        56s   Breast cancer Daughter 49   Throat cancer Brother    Arrhythmia Brother     Social History   Tobacco Use   Smoking status: Never   Smokeless tobacco: Never  Substance Use Topics   Alcohol use: No   Drug use: No    Medications: I have reviewed the patient's current medications. Prior to Admission:  Medications Prior to Admission  Medication Sig Dispense Refill Last Dose   alendronate (FOSAMAX) 70 MG tablet Take 70 mg by mouth once a week.   05/09/2023   ALPRAZolam (XANAX) 0.5 MG tablet Take 0.5 mg by mouth daily as needed for anxiety.    05/08/2023   ARTIFICIAL TEAR OP Place 1 drop into both eyes daily as needed (dry eyes).   05/08/2023   Calcium Carb-Cholecalciferol (OYSTER SHELL CALCIUM 250+D) 250-3.125  MG-MCG TABS Take 1 tablet by mouth daily.   Past Week   citalopram (CELEXA) 20 MG tablet Take 20 mg by mouth daily.   05/09/2023   ELIQUIS 5 MG TABS tablet TAKE 1 TABLET(5 MG) BY MOUTH TWICE DAILY 60 tablet 5 05/09/2023   metoprolol tartrate (LOPRESSOR) 25 MG tablet Take 1 tablet (25 mg total) by mouth 2 (two) times daily. 60 tablet 1 05/09/2023   rosuvastatin (CRESTOR) 40 MG tablet Take 1 tablet (40 mg total) by mouth at bedtime. 30 tablet 1 05/08/2023   SYNTHROID 88 MCG tablet Take 88 mcg by mouth daily.   05/09/2023   Vitamin D, Ergocalciferol, (DRISDOL) 1.25 MG (50000 UNIT) CAPS capsule Take 50,000 Units by mouth once a week.   05/08/2023   Scheduled: Continuous:  sodium chloride Stopped (05/10/23 1050)   dextrose 5 %  and 0.45 % NaCl 75 mL/hr at 05/10/23 1534   WNU:[id number] **OR** acetaminophen, glucagon (human recombinant), guaiFENesin, hydrALAZINE, ipratropium-albuterol, LORazepam, metoprolol tartrate, morphine injection, ondansetron **OR** ondansetron (ZOFRAN) IV, oxyCODONE, phenol, senna-docusate, traZODone  Allergies  Allergen Reactions   Tape Rash    Can only tolerate LIMITED exposure; no "plastic tape," please   Wound Dressing Adhesive Rash    Can only tolerate LIMITED exposure; no "plastic tape," please     ROS:  A comprehensive review of systems was negative except for: Gastrointestinal: positive for abdominal pain and nausea  Blood pressure 112/64, pulse 62, temperature 98.7 F (37.1 C), temperature source Oral, resp. rate 20, height 5\' 5"  (1.651 m), weight 81.7 kg, SpO2 96%. Physical Exam Vitals reviewed.  HENT:     Head: Normocephalic.     Nose: Nose normal.  Eyes:     Extraocular Movements: Extraocular movements intact.  Cardiovascular:     Rate and Rhythm: Normal rate.  Pulmonary:     Effort: Pulmonary effort is normal.  Abdominal:     General: There is no distension.     Palpations: Abdomen is soft.     Tenderness: There is no abdominal tenderness.     Hernia: A hernia is present. Hernia is present in the ventral area.     Comments: Right lower abdomen hernia felt, prior open gallbladder incision, and the hernia is inferior to this more in a smaller incision she reports was from a drain tube   Musculoskeletal:        General: Normal range of motion.     Cervical back: Normal range of motion.  Skin:    General: Skin is warm.  Neurological:     General: No focal deficit present.     Mental Status: She is alert and oriented to person, place, and time.  Psychiatric:        Mood and Affect: Mood normal.        Behavior: Behavior normal.     Results: Results for orders placed or performed during the hospital encounter of 05/09/23 (from the past 48 hour(s))   Comprehensive metabolic panel     Status: Abnormal   Collection Time: 05/09/23  5:50 PM  Result Value Ref Range   Sodium 137 135 - 145 mmol/L   Potassium 4.0 3.5 - 5.1 mmol/L   Chloride 102 98 - 111 mmol/L   CO2 25 22 - 32 mmol/L   Glucose, Bld 161 (H) 70 - 99 mg/dL    Comment: Glucose reference range applies only to samples taken after fasting for at least 8 hours.   BUN 18 8 - 23 mg/dL  Creatinine, Ser 0.93 0.44 - 1.00 mg/dL   Calcium 8.9 8.9 - 40.3 mg/dL   Total Protein 7.7 6.5 - 8.1 g/dL   Albumin 4.1 3.5 - 5.0 g/dL   AST 36 15 - 41 U/L   ALT 41 0 - 44 U/L   Alkaline Phosphatase 36 (L) 38 - 126 U/L   Total Bilirubin 0.9 0.3 - 1.2 mg/dL   GFR, Estimated >47 >42 mL/min    Comment: (NOTE) Calculated using the CKD-EPI Creatinine Equation (2021)    Anion gap 10 5 - 15    Comment: Performed at [facility], [address] [place], [place] [address]  CBC with Differential     Status: Abnormal   Collection Time: 05/09/23  5:50 PM  Result Value Ref Range   WBC 10.4 4.0 - 10.5 K/uL   RBC 4.84 3.87 - 5.11 MIL/uL   Hemoglobin 14.9 12.0 - 15.0 g/dL   HCT 87.5 (H) 64.3 - 32.9 %   MCV 96.3 80.0 - 100.0 fL   MCH 30.8 26.0 - 34.0 pg   MCHC 32.0 30.0 - 36.0 g/dL   RDW 51.8 84.1 - 66.0 %   Platelets 275 150 - 400 K/uL   nRBC 0.0 0.0 - 0.2 %   Neutrophils Relative % 83 %   Neutro Abs 8.7 (H) 1.7 - 7.7 K/uL   Lymphocytes Relative 13 %   Lymphs Abs 1.3 0.7 - 4.0 K/uL   Monocytes Relative 3 %   Monocytes Absolute 0.3 0.1 - 1.0 K/uL   Eosinophils Relative 1 %   Eosinophils Absolute 0.1 0.0 - 0.5 K/uL   Basophils Relative 0 %   Basophils Absolute 0.0 0.0 - 0.1 K/uL   Immature Granulocytes 0 %   Abs Immature Granulocytes 0.03 0.00 - 0.07 K/uL    Comment: Performed at [facility], [address], [place], [place] [address]  Lactic acid, plasma     Status: None   Collection Time: 05/09/23  5:50 PM  Result Value Ref Range   Lactic Acid, Venous 1.4 0.5 - 1.9 mmol/L    Comment:  Performed at [facility], [address], [place], [place] [address]  I-stat chem 8, ED (not at [facility], [facility] or [facility])     Status: Abnormal   Collection Time: 05/09/23  5:57 PM  Result Value Ref Range   Sodium 141 135 - 145 mmol/L   Potassium 4.0 3.5 - 5.1 mmol/L   Chloride 102 98 - 111 mmol/L   BUN 19 8 - 23 mg/dL   Creatinine, Ser 2.35 0.44 - 1.00 mg/dL   Glucose, Bld 573 (H) 70 - 99 mg/dL    Comment: Glucose reference range applies only to samples taken after fasting for at least 8 hours.   Calcium, Ion 1.14 (L) 1.15 - 1.40 mmol/L   TCO2 25 22 - 32 mmol/L   Hemoglobin 16.3 (H) 12.0 - 15.0 g/dL   HCT 22.0 (H) 25.4 - 27.0 %  Lactic acid, plasma     Status: None   Collection Time: 05/09/23  7:21 PM  Result Value Ref Range   Lactic Acid, Venous 1.9 0.5 - 1.9 mmol/L    Comment: Performed at [facility], [address], [place], [place] [address]  Surgical PCR screen     Status: None   Collection Time: 05/10/23  3:28 AM   Specimen: Nasal Mucosa; Nasal Swab  Result Value Ref Range   MRSA, PCR NEGATIVE NEGATIVE   Staphylococcus aureus NEGATIVE NEGATIVE  Comment: (NOTE) The Xpert SA Assay (FDA approved for NASAL specimens in patients 22 years of age and older), is one component of a comprehensive surveillance program. It is not intended to diagnose infection nor to guide or monitor treatment. Performed at [facility], [address], [place], [place] [address]   HIV Antibody (routine testing w rflx)     Status: None   Collection Time: 05/10/23  5:55 AM  Result Value Ref Range   HIV Screen 4th Generation wRfx Non Reactive Non Reactive    Comment: Performed at [facility], [address] [place], [place] [address]  Comprehensive metabolic panel     Status: Abnormal   Collection Time: 05/10/23  5:55 AM  Result Value Ref Range   Sodium 138 135 - 145 mmol/L   Potassium 3.6 3.5 - 5.1 mmol/L   Chloride 101 98 - 111 mmol/L   CO2 25 22 - 32 mmol/L   Glucose, Bld 103 (H) 70 - 99  mg/dL    Comment: Glucose reference range applies only to samples taken after fasting for at least 8 hours.   BUN 15 8 - 23 mg/dL   Creatinine, Ser 0.98 0.44 - 1.00 mg/dL   Calcium 8.3 (L) 8.9 - 10.3 mg/dL   Total Protein 6.7 6.5 - 8.1 g/dL   Albumin 3.4 (L) 3.5 - 5.0 g/dL   AST 29 15 - 41 U/L   ALT 33 0 - 44 U/L   Alkaline Phosphatase 31 (L) 38 - 126 U/L   Total Bilirubin 0.7 0.3 - 1.2 mg/dL   GFR, Estimated >11 >91 mL/min    Comment: (NOTE) Calculated using the CKD-EPI Creatinine Equation (2021)    Anion gap 12 5 - 15    Comment: Performed at [facility], [address] [place], [place] [address]  Magnesium     Status: None   Collection Time: 05/10/23  5:55 AM  Result Value Ref Range   Magnesium 1.9 1.7 - 2.4 mg/dL    Comment: Performed at [facility], [address] [place], [place] [address]  CBC with Differential/Platelet     Status: Abnormal   Collection Time: 05/10/23  5:55 AM  Result Value Ref Range   WBC 11.2 (H) 4.0 - 10.5 K/uL   RBC 4.49 3.87 - 5.11 MIL/uL   Hemoglobin 13.5 12.0 - 15.0 g/dL   HCT 08.6 57.8 - 46.9 %   MCV 96.4 80.0 - 100.0 fL   MCH 30.1 26.0 - 34.0 pg   MCHC 31.2 30.0 - 36.0 g/dL   RDW 62.9 52.8 - 41.3 %   Platelets 251 150 - 400 K/uL   nRBC 0.0 0.0 - 0.2 %   Neutrophils Relative % 65 %   Neutro Abs 7.1 1.7 - 7.7 K/uL   Lymphocytes Relative 28 %   Lymphs Abs 3.2 0.7 - 4.0 K/uL   Monocytes Relative 7 %   Monocytes Absolute 0.8 0.1 - 1.0 K/uL   Eosinophils Relative 0 %   Eosinophils Absolute 0.0 0.0 - 0.5 K/uL   Basophils Relative 0 %   Basophils Absolute 0.0 0.0 - 0.1 K/uL   Immature Granulocytes 0 %   Abs Immature Granulocytes 0.03 0.00 - 0.07 K/uL    Comment: Performed at [facility], [address], [place], [place] [address]  Glucose, capillary     Status: Abnormal   Collection Time: 05/10/23 11:01 AM  Result Value Ref Range   Glucose-Capillary 124 (H) 70 - 99 mg/dL  Comment: Glucose reference range applies only to samples taken  after fasting for at least 8 hours.   Personally reviewed- showed patient- small bowel into  right sided hernia in the lower abdomen  DG Abd Portable 1 View  Result Date: 05/09/2023 CLINICAL DATA:  Check gastric catheter placement EXAM: PORTABLE ABDOMEN - 1 VIEW COMPARISON:  None Available. FINDINGS: Gastric catheter is noted within the stomach. No free air is seen. The dilated loops of small bowel are not well appreciated on this exam. IMPRESSION: Gastric catheter within the stomach. Electronically Signed   By: Alcide Clever M.D.   On: 05/09/2023 22:54   CT ABDOMEN PELVIS W CONTRAST  Result Date: 05/09/2023 CLINICAL DATA:  Right groin pain, known hernia EXAM: CT ABDOMEN AND PELVIS WITH CONTRAST TECHNIQUE: Multidetector CT imaging of the abdomen and pelvis was performed using the standard protocol following bolus administration of intravenous contrast. RADIATION DOSE REDUCTION: This exam was performed according to the departmental dose-optimization program which includes automated exposure control, adjustment of the mA and/or kV according to patient size and/or use of iterative reconstruction technique. CONTRAST:  OMNIPAQUE IOHEXOL 300 MG/ML  SOLN COMPARISON:  04/19/2023 FINDINGS: Lower chest: Bilateral lower lobe scarring/atelectasis. Hepatobiliary: Scattered hepatic cysts measuring up to 15 mm inferiorly in the left hepatic lobe (series 2/image 29). Status post cholecystectomy. No intrahepatic ductal dilatation. Dilated common duct, measuring 12 mm centrally, and smoothly tapering at the ampulla, likely postsurgical. Pancreas: 11 mm unilocular cyst along the inferior aspect of the pancreatic body (series 2/image 28), likely a small pseudocyst or side branch IPMN. No associated parenchymal atrophy or main pancreatic ductal dilatation. Spleen: Within normal limits Adrenals/Urinary Tract: Adrenal glands are within normal limits. Multiple bilateral renal cysts, most of which measure simple fluid density,  measuring to 2.7 cm the posterior left lower kidney (series 2/image 31), benign (Bosniak I). Additional 1.8 cm hyperdense lesion in the anterior right upper kidney (series 2/image 24) and 1.9 cm hyperdense lesion in the left lower renal sinus (series 2/image 36), indeterminate but favoring a benign hemorrhagic cysts. No renal, ureteral, or bladder calculi.  No hydronephrosis. Bladder is within normal limits. Stomach/Bowel: Stomach is notable for a 14 mm fat density lesion along the greater curvature of the stomach (series 2/image 17), suggesting a small lipoma. Dilated loops of small bowel in the left mid/lower abdomen (series 2/image 87), with transition entering a moderate right spigelian hernia (series 2/image 84), compatible with small bowel obstruction. Decompressed loops of distal small bowel within and exiting the hernia, with mild mesenteric stranding (series 2/image 60). No pneumatosis or free air. Appendix is not discretely visualized. Scattered colonic diverticulosis, without evidence of diverticulitis. Vascular/Lymphatic: No evidence of abdominal aortic aneurysm. Atherosclerotic calcifications of the abdominal aorta and branch vessels. No suspicious abdominopelvic lymphadenopathy. Reproductive: Status post hysterectomy. Bilateral ovaries are within normal limits. Other: Small fat containing bilateral inguinal hernias (series 2/image 80). Musculoskeletal: Mild degenerative changes of the lower thoracic spine. IMPRESSION: Small bowel obstruction on the basis of a moderate right spigelian hernia. Mild mesenteric stranding within the hernia, raising concern for incarceration. No pneumatosis or free air. Surgical consultation is suggested. 11 mm unilocular cyst along the inferior aspect of the pancreatic body, likely a small pseudocyst or side branch IPMN. Follow-up MRI abdomen with/without contrast is suggested in 2 years. Additional ancillary findings as above. Electronically Signed   By: Charline Bills  M.D.   On: 05/09/2023 20:09     Assessment & Plan:  [name] is  a 72 y.o. female with a right sided ventral hernia. I think this is more incisional hernia and not spigelian. Discussed robotic assisted laparoscopic hernia repair with mesh versus open. Discussed risk of bleeding, infection, recurrence, use of mesh, injury to bowel.   NPO midnight   All questions were answered to the satisfaction of the patient.  RN sent message that NG very irritating for the patient- 300 output since placed. Will allow dc now as hernia was soft and no nausea.   Lucretia Roers 05/10/2023, 4:08 PM

## 2023-05-10 NOTE — Assessment & Plan Note (Addendum)
S/p laparoscopic ventral hernia repair 9/18 by Dr. Henreitta Leber - ADAT per surgery

## 2023-05-10 NOTE — Assessment & Plan Note (Signed)
Continue metoprolol.

## 2023-05-10 NOTE — Progress Notes (Signed)
Mobility Specialist Progress Note:    05/10/23 0945  Mobility  Activity Ambulated with assistance in hallway  Level of Assistance Standby assist, set-up cues, supervision of patient - no hands on  Assistive Device None  Distance Ambulated (ft) 150 ft  Range of Motion/Exercises Active;All extremities  Activity Response Tolerated well  Mobility Referral Yes  $Mobility charge 1 Mobility  Mobility Specialist Start Time (ACUTE ONLY) 0945  Mobility Specialist Stop Time (ACUTE ONLY) 0955  Mobility Specialist Time Calculation (min) (ACUTE ONLY) 10 min   Pt received in bed, requesting assistance to bathroom, NT in room. Required SBA to stand and ambulate with no AD, agreeable to further ambulation. Tolerated well, asx throughout. Returned pt to room, all needs met.   [name] Mobility Specialist Please contact via Special educational needs teacher or  Rehab office at [phone]

## 2023-05-10 NOTE — H&P (View-Only) (Signed)
[facility] Consult  Reason for Consult: Incarcerated ventral hernia  Referring Physician: Dr. Audrie Lia   Chief Complaint   Hernia     HPI: [name] is a 72 y.o. female with a hernia she has known about since labor after presenting with abdominal pain and vomiting. She was discharged from the ED and started having worsening pain in the last 24 hours and some nausea and 1 episode of vomiting. She had NG placed and CT scan with dilated bowel going into the hernia. I was consulted and discussed admission to the hospitalist with plans for surgery after her Eliquis wears off. She reports taking it yesterday AM. Her abdomen is not hurting and she says she is not nauseated. She has only had 300 out since the NG was placed. She had a prior open cholecystectomy and a appendectomy through the same incision. She says that the lower incision on the right lower abdomen was from a drain tube placed at the time of that surgery 40 years ago.  Past Medical History:  Diagnosis Date   Depression    Diabetes mellitus without complication (HCC)    Hyperlipidemia 10/19/2018   Hypertension    Hypothyroidism    Stroke (HCC) 10/19/2018    Past Surgical History:  Procedure Laterality Date   APPENDECTOMY     BACK SURGERY     CHOLECYSTECTOMY     CORONARY PRESSURE/FFR STUDY N/A 05/26/2020   Procedure: INTRAVASCULAR PRESSURE WIRE/FFR STUDY;  Surgeon: Swaziland, Peter M, MD;  Location: [facility] INVASIVE CV LAB;  Service: Cardiovascular; iFR/FFR of 65% mLAD -  iFR (0.93) and FFR (0.84) = NOT HEMODYNAMICALLY/ PHYSIOLOGICALLY SIGNIFICANT.    LEFT HEART CATH AND CORONARY ANGIOGRAPHY N/A 05/26/2020   Procedure: LEFT HEART CATH AND CORONARY ANGIOGRAPHY;  Surgeon: Swaziland, Peter M, MD;  Location: [facility] INVASIVE CV LAB;  Service: Cardiovascular; non-STEMI = Mid LAD 65%.  FFR and IFR both negative for hemodynamic significance.  Mildly elevated LVEDP. ->  Recommend medical management.   TRANSTHORACIC ECHOCARDIOGRAM   05/24/2020   In setting of non-STEMI: EF 60 to 65%.  No R WMA.  "Normal "diastolic parameters.  Normal RV.  Mild MR.  Mild aortic valve sclerosis without stenosis.  (Poor quality)   VAGINAL HYSTERECTOMY      Family History  Problem Relation Age of Onset   Coronary artery disease Mother        stent age 57, died at age 31 of complications from diabetes/heart disease   Diabetes Mother    Colon cancer Father        died at 58   Breast cancer Sister        71s   Breast cancer Daughter 47   Throat cancer Brother    Arrhythmia Brother     Social History   Tobacco Use   Smoking status: Never   Smokeless tobacco: Never  Substance Use Topics   Alcohol use: No   Drug use: No    Medications: I have reviewed the patient's current medications. Prior to Admission:  Medications Prior to Admission  Medication Sig Dispense Refill Last Dose   alendronate (FOSAMAX) 70 MG tablet Take 70 mg by mouth once a week.   05/09/2023   ALPRAZolam (XANAX) 0.5 MG tablet Take 0.5 mg by mouth daily as needed for anxiety.    05/08/2023   ARTIFICIAL TEAR OP Place 1 drop into both eyes daily as needed (dry eyes).   05/08/2023   Calcium Carb-Cholecalciferol (OYSTER SHELL CALCIUM 250+D) 250-3.125  MG-MCG TABS Take 1 tablet by mouth daily.   Past Week   citalopram (CELEXA) 20 MG tablet Take 20 mg by mouth daily.   05/09/2023   ELIQUIS 5 MG TABS tablet TAKE 1 TABLET(5 MG) BY MOUTH TWICE DAILY 60 tablet 5 05/09/2023   metoprolol tartrate (LOPRESSOR) 25 MG tablet Take 1 tablet (25 mg total) by mouth 2 (two) times daily. 60 tablet 1 05/09/2023   rosuvastatin (CRESTOR) 40 MG tablet Take 1 tablet (40 mg total) by mouth at bedtime. 30 tablet 1 05/08/2023   SYNTHROID 88 MCG tablet Take 88 mcg by mouth daily.   05/09/2023   Vitamin D, Ergocalciferol, (DRISDOL) 1.25 MG (50000 UNIT) CAPS capsule Take 50,000 Units by mouth once a week.   05/08/2023   Scheduled: Continuous:  sodium chloride Stopped (05/10/23 1050)   dextrose 5 %  and 0.45 % NaCl 75 mL/hr at 05/10/23 1534   ZOX:[id number] **OR** acetaminophen, glucagon (human recombinant), guaiFENesin, hydrALAZINE, ipratropium-albuterol, LORazepam, metoprolol tartrate, morphine injection, ondansetron **OR** ondansetron (ZOFRAN) IV, oxyCODONE, phenol, senna-docusate, traZODone  Allergies  Allergen Reactions   Tape Rash    Can only tolerate LIMITED exposure; no "plastic tape," please   Wound Dressing Adhesive Rash    Can only tolerate LIMITED exposure; no "plastic tape," please     ROS:  A comprehensive review of systems was negative except for: Gastrointestinal: positive for abdominal pain and nausea  Blood pressure 112/64, pulse 62, temperature 98.7 F (37.1 C), temperature source Oral, resp. rate 20, height 5\' 5"  (1.651 m), weight 81.7 kg, SpO2 96%. Physical Exam Vitals reviewed.  HENT:     Head: Normocephalic.     Nose: Nose normal.  Eyes:     Extraocular Movements: Extraocular movements intact.  Cardiovascular:     Rate and Rhythm: Normal rate.  Pulmonary:     Effort: Pulmonary effort is normal.  Abdominal:     General: There is no distension.     Palpations: Abdomen is soft.     Tenderness: There is no abdominal tenderness.     Hernia: A hernia is present. Hernia is present in the ventral area.     Comments: Right lower abdomen hernia felt, prior open gallbladder incision, and the hernia is inferior to this more in a smaller incision she reports was from a drain tube   Musculoskeletal:        General: Normal range of motion.     Cervical back: Normal range of motion.  Skin:    General: Skin is warm.  Neurological:     General: No focal deficit present.     Mental Status: She is alert and oriented to person, place, and time.  Psychiatric:        Mood and Affect: Mood normal.        Behavior: Behavior normal.     Results: Results for orders placed or performed during the hospital encounter of 05/09/23 (from the past 48 hour(s))   Comprehensive metabolic panel     Status: Abnormal   Collection Time: 05/09/23  5:50 PM  Result Value Ref Range   Sodium 137 135 - 145 mmol/L   Potassium 4.0 3.5 - 5.1 mmol/L   Chloride 102 98 - 111 mmol/L   CO2 25 22 - 32 mmol/L   Glucose, Bld 161 (H) 70 - 99 mg/dL    Comment: Glucose reference range applies only to samples taken after fasting for at least 8 hours.   BUN 18 8 - 23 mg/dL  Creatinine, Ser 0.93 0.44 - 1.00 mg/dL   Calcium 8.9 8.9 - 16.1 mg/dL   Total Protein 7.7 6.5 - 8.1 g/dL   Albumin 4.1 3.5 - 5.0 g/dL   AST 36 15 - 41 U/L   ALT 41 0 - 44 U/L   Alkaline Phosphatase 36 (L) 38 - 126 U/L   Total Bilirubin 0.9 0.3 - 1.2 mg/dL   GFR, Estimated >09 >60 mL/min    Comment: (NOTE) Calculated using the CKD-EPI Creatinine Equation (2021)    Anion gap 10 5 - 15    Comment: Performed at [facility], [address] [place], [place] [address]  CBC with Differential     Status: Abnormal   Collection Time: 05/09/23  5:50 PM  Result Value Ref Range   WBC 10.4 4.0 - 10.5 K/uL   RBC 4.84 3.87 - 5.11 MIL/uL   Hemoglobin 14.9 12.0 - 15.0 g/dL   HCT 81.1 (H) 91.4 - 78.2 %   MCV 96.3 80.0 - 100.0 fL   MCH 30.8 26.0 - 34.0 pg   MCHC 32.0 30.0 - 36.0 g/dL   RDW 95.6 21.3 - 08.6 %   Platelets 275 150 - 400 K/uL   nRBC 0.0 0.0 - 0.2 %   Neutrophils Relative % 83 %   Neutro Abs 8.7 (H) 1.7 - 7.7 K/uL   Lymphocytes Relative 13 %   Lymphs Abs 1.3 0.7 - 4.0 K/uL   Monocytes Relative 3 %   Monocytes Absolute 0.3 0.1 - 1.0 K/uL   Eosinophils Relative 1 %   Eosinophils Absolute 0.1 0.0 - 0.5 K/uL   Basophils Relative 0 %   Basophils Absolute 0.0 0.0 - 0.1 K/uL   Immature Granulocytes 0 %   Abs Immature Granulocytes 0.03 0.00 - 0.07 K/uL    Comment: Performed at [facility], [address] [place], [place] [address]  Lactic acid, plasma     Status: None   Collection Time: 05/09/23  5:50 PM  Result Value Ref Range   Lactic Acid, Venous 1.4 0.5 - 1.9 mmol/L    Comment:  Performed at [facility], [address] [place], [place] [address]  I-stat chem 8, ED (not at [facility], [facility] or [facility])     Status: Abnormal   Collection Time: 05/09/23  5:57 PM  Result Value Ref Range   Sodium 141 135 - 145 mmol/L   Potassium 4.0 3.5 - 5.1 mmol/L   Chloride 102 98 - 111 mmol/L   BUN 19 8 - 23 mg/dL   Creatinine, Ser 2.84 0.44 - 1.00 mg/dL   Glucose, Bld 132 (H) 70 - 99 mg/dL    Comment: Glucose reference range applies only to samples taken after fasting for at least 8 hours.   Calcium, Ion 1.14 (L) 1.15 - 1.40 mmol/L   TCO2 25 22 - 32 mmol/L   Hemoglobin 16.3 (H) 12.0 - 15.0 g/dL   HCT 44.0 (H) 10.2 - 72.5 %  Lactic acid, plasma     Status: None   Collection Time: 05/09/23  7:21 PM  Result Value Ref Range   Lactic Acid, Venous 1.9 0.5 - 1.9 mmol/L    Comment: Performed at [facility], [address], [place], [place] [address]  Surgical PCR screen     Status: None   Collection Time: 05/10/23  3:28 AM   Specimen: Nasal Mucosa; Nasal Swab  Result Value Ref Range   MRSA, PCR NEGATIVE NEGATIVE   Staphylococcus aureus NEGATIVE NEGATIVE  Comment: (NOTE) The Xpert SA Assay (FDA approved for NASAL specimens in patients 65 years of age and older), is one component of a comprehensive surveillance program. It is not intended to diagnose infection nor to guide or monitor treatment. Performed at [facility], [address] [place], [place] [address]   HIV Antibody (routine testing w rflx)     Status: None   Collection Time: 05/10/23  5:55 AM  Result Value Ref Range   HIV Screen 4th Generation wRfx Non Reactive Non Reactive    Comment: Performed at [facility], [address] [place], [place] [address]  Comprehensive metabolic panel     Status: Abnormal   Collection Time: 05/10/23  5:55 AM  Result Value Ref Range   Sodium 138 135 - 145 mmol/L   Potassium 3.6 3.5 - 5.1 mmol/L   Chloride 101 98 - 111 mmol/L   CO2 25 22 - 32 mmol/L   Glucose, Bld 103 (H) 70 - 99  mg/dL    Comment: Glucose reference range applies only to samples taken after fasting for at least 8 hours.   BUN 15 8 - 23 mg/dL   Creatinine, Ser 1.91 0.44 - 1.00 mg/dL   Calcium 8.3 (L) 8.9 - 10.3 mg/dL   Total Protein 6.7 6.5 - 8.1 g/dL   Albumin 3.4 (L) 3.5 - 5.0 g/dL   AST 29 15 - 41 U/L   ALT 33 0 - 44 U/L   Alkaline Phosphatase 31 (L) 38 - 126 U/L   Total Bilirubin 0.7 0.3 - 1.2 mg/dL   GFR, Estimated >47 >82 mL/min    Comment: (NOTE) Calculated using the CKD-EPI Creatinine Equation (2021)    Anion gap 12 5 - 15    Comment: Performed at [facility], [address] [place], [place] [address]  Magnesium     Status: None   Collection Time: 05/10/23  5:55 AM  Result Value Ref Range   Magnesium 1.9 1.7 - 2.4 mg/dL    Comment: Performed at [facility], [address], [place], [place] [address]  CBC with Differential/Platelet     Status: Abnormal   Collection Time: 05/10/23  5:55 AM  Result Value Ref Range   WBC 11.2 (H) 4.0 - 10.5 K/uL   RBC 4.49 3.87 - 5.11 MIL/uL   Hemoglobin 13.5 12.0 - 15.0 g/dL   HCT 78.4 69.6 - 29.5 %   MCV 96.4 80.0 - 100.0 fL   MCH 30.1 26.0 - 34.0 pg   MCHC 31.2 30.0 - 36.0 g/dL   RDW 28.4 13.2 - 44.0 %   Platelets 251 150 - 400 K/uL   nRBC 0.0 0.0 - 0.2 %   Neutrophils Relative % 65 %   Neutro Abs 7.1 1.7 - 7.7 K/uL   Lymphocytes Relative 28 %   Lymphs Abs 3.2 0.7 - 4.0 K/uL   Monocytes Relative 7 %   Monocytes Absolute 0.8 0.1 - 1.0 K/uL   Eosinophils Relative 0 %   Eosinophils Absolute 0.0 0.0 - 0.5 K/uL   Basophils Relative 0 %   Basophils Absolute 0.0 0.0 - 0.1 K/uL   Immature Granulocytes 0 %   Abs Immature Granulocytes 0.03 0.00 - 0.07 K/uL    Comment: Performed at [facility], [address] [place], [place] [address]  Glucose, capillary     Status: Abnormal   Collection Time: 05/10/23 11:01 AM  Result Value Ref Range   Glucose-Capillary 124 (H) 70 - 99 mg/dL  Comment: Glucose reference range applies only to samples taken  after fasting for at least 8 hours.   Personally reviewed- showed patient- small bowel into  right sided hernia in the lower abdomen  DG Abd Portable 1 View  Result Date: 05/09/2023 CLINICAL DATA:  Check gastric catheter placement EXAM: PORTABLE ABDOMEN - 1 VIEW COMPARISON:  None Available. FINDINGS: Gastric catheter is noted within the stomach. No free air is seen. The dilated loops of small bowel are not well appreciated on this exam. IMPRESSION: Gastric catheter within the stomach. Electronically Signed   By: Alcide Clever M.D.   On: 05/09/2023 22:54   CT ABDOMEN PELVIS W CONTRAST  Result Date: 05/09/2023 CLINICAL DATA:  Right groin pain, known hernia EXAM: CT ABDOMEN AND PELVIS WITH CONTRAST TECHNIQUE: Multidetector CT imaging of the abdomen and pelvis was performed using the standard protocol following bolus administration of intravenous contrast. RADIATION DOSE REDUCTION: This exam was performed according to the departmental dose-optimization program which includes automated exposure control, adjustment of the mA and/or kV according to patient size and/or use of iterative reconstruction technique. CONTRAST:  OMNIPAQUE IOHEXOL 300 MG/ML  SOLN COMPARISON:  04/19/2023 FINDINGS: Lower chest: Bilateral lower lobe scarring/atelectasis. Hepatobiliary: Scattered hepatic cysts measuring up to 15 mm inferiorly in the left hepatic lobe (series 2/image 29). Status post cholecystectomy. No intrahepatic ductal dilatation. Dilated common duct, measuring 12 mm centrally, and smoothly tapering at the ampulla, likely postsurgical. Pancreas: 11 mm unilocular cyst along the inferior aspect of the pancreatic body (series 2/image 28), likely a small pseudocyst or side branch IPMN. No associated parenchymal atrophy or main pancreatic ductal dilatation. Spleen: Within normal limits Adrenals/Urinary Tract: Adrenal glands are within normal limits. Multiple bilateral renal cysts, most of which measure simple fluid density,  measuring to 2.7 cm the posterior left lower kidney (series 2/image 31), benign (Bosniak I). Additional 1.8 cm hyperdense lesion in the anterior right upper kidney (series 2/image 24) and 1.9 cm hyperdense lesion in the left lower renal sinus (series 2/image 36), indeterminate but favoring a benign hemorrhagic cysts. No renal, ureteral, or bladder calculi.  No hydronephrosis. Bladder is within normal limits. Stomach/Bowel: Stomach is notable for a 14 mm fat density lesion along the greater curvature of the stomach (series 2/image 17), suggesting a small lipoma. Dilated loops of small bowel in the left mid/lower abdomen (series 2/image 87), with transition entering a moderate right spigelian hernia (series 2/image 84), compatible with small bowel obstruction. Decompressed loops of distal small bowel within and exiting the hernia, with mild mesenteric stranding (series 2/image 60). No pneumatosis or free air. Appendix is not discretely visualized. Scattered colonic diverticulosis, without evidence of diverticulitis. Vascular/Lymphatic: No evidence of abdominal aortic aneurysm. Atherosclerotic calcifications of the abdominal aorta and branch vessels. No suspicious abdominopelvic lymphadenopathy. Reproductive: Status post hysterectomy. Bilateral ovaries are within normal limits. Other: Small fat containing bilateral inguinal hernias (series 2/image 80). Musculoskeletal: Mild degenerative changes of the lower thoracic spine. IMPRESSION: Small bowel obstruction on the basis of a moderate right spigelian hernia. Mild mesenteric stranding within the hernia, raising concern for incarceration. No pneumatosis or free air. Surgical consultation is suggested. 11 mm unilocular cyst along the inferior aspect of the pancreatic body, likely a small pseudocyst or side branch IPMN. Follow-up MRI abdomen with/without contrast is suggested in 2 years. Additional ancillary findings as above. Electronically Signed   By: Charline Bills  M.D.   On: 05/09/2023 20:09     Assessment & Plan:  [name] is  a 72 y.o. female with a right sided ventral hernia. I think this is more incisional hernia and not spigelian. Discussed robotic assisted laparoscopic hernia repair with mesh versus open. Discussed risk of bleeding, infection, recurrence, use of mesh, injury to bowel.   NPO midnight   All questions were answered to the satisfaction of the patient.  RN sent message that NG very irritating for the patient- 300 output since placed. Will allow dc now as hernia was soft and no nausea.   Lucretia Roers 05/10/2023, 4:08 PM

## 2023-05-10 NOTE — Assessment & Plan Note (Signed)
-   Depression and anxiety - Continue Celexa and Xanax

## 2023-05-10 NOTE — Progress Notes (Signed)
Patient admitted to med surge unit overnight from ED. NG to low intermittent suction per order, with brown output, no complaints of pain. Call bell within reach, bed in lowest position.

## 2023-05-10 NOTE — Assessment & Plan Note (Signed)
-   Continue metoprolol - Holding Eliquis in the setting of coffee-ground GI output and possible surgery

## 2023-05-10 NOTE — Progress Notes (Signed)
   05/10/23 0927  TOC Brief Assessment  Insurance and Status Reviewed  Patient has primary care physician Yes  Home environment has been reviewed from home  Prior level of function: independent  Prior/Current Home Services No current home services  Social Determinants of Health Reivew SDOH reviewed no interventions necessary  Readmission risk has been reviewed Yes  Transition of care needs no transition of care needs at this time    Transition of Care Department [facility]) has reviewed patient and no TOC needs have been identified at this time. We will continue to monitor patient advancement through interdisciplinary progression rounds. If new patient transition needs arise, please place a TOC consult.

## 2023-05-10 NOTE — Progress Notes (Signed)
PROGRESS NOTE    ADALIN WATLER  LKG:[id number] DOB: 20-Jan-1951 DOA: 05/09/2023 PCP: Gwenlyn Found, MD   Brief Narrative:  72 year old with history of paroxysmal A-fib on Eliquis, depression, anxiety, HTN, HLD, CVA, MI comes to the hospital abdominal pain.  Was diagnosed with hernia on Labor Day weekend and since then having abdominal pain with nausea.  CT showed SBO with hernia concerning for incarceration, general surgery consulted.   Assessment & Plan:  Principal Problem:   SBO (small bowel obstruction) (HCC) Active Problems:   Hypertension   Depression   Hyperlipidemia   Paroxysmal atrial fibrillation (HCC)   SBO (small bowel obstruction) (HCC) secondary to right-sided moderate spigelian hernia Continue n.p.o., D5 fluids, Accu-Chek.  Allow Eliquis washout, likely will need surgery tomorrow.  NG tube in place   Paroxysmal atrial fibrillation (HCC) IV prn meds for now.  Eliquis is currently on hold   Hyperlipidemia Statin on hold   Depression Continue to hold p.o. meds   Hypertension IV as needed  DVT prophylaxis: SCDs Start: 05/09/23 2319 Code Status: DNR Family Communication:   Status is: Inpatient Remains inpatient appropriate because: Continue hospital stay for management of her small bowel obstruction    Subjective: No complaints feeling okay.  NG tube in place   Examination:  General exam: Appears calm and comfortable, NG tube in place Respiratory system: Clear to auscultation. Respiratory effort normal. Cardiovascular system: S1 & S2 heard, RRR. No JVD, murmurs, rubs, gallops or clicks. No pedal edema. Gastrointestinal system: Abdomen i is soft with diminished bowel sounds Central nervous system: Alert and oriented. No focal neurological deficits. Extremities: Symmetric 5 x 5 power. Skin: No rashes, lesions or ulcers Psychiatry: Judgement and insight appear normal. Mood & affect appropriate.        Objective: Vitals:   05/09/23 2130  05/09/23 2200 05/09/23 2326 05/10/23 0330  BP: 123/63 125/62 (!) 113/93 122/63  Pulse: 71 69 79 71  Resp: 15 17 18 18   Temp:   98.1 F (36.7 C) 98 F (36.7 C)  TempSrc:      SpO2: 90% 93% 96% 96%  Weight:   81.7 kg   Height:   5\' 5"  (1.651 m)     Intake/Output Summary (Last 24 hours) at 05/10/2023 0852 Last data filed at 05/10/2023 0825 Gross per 24 hour  Intake 243.58 ml  Output 375 ml  Net -131.42 ml   Filed Weights   05/09/23 1648 05/09/23 2326  Weight: 79.3 kg 81.7 kg    Scheduled Meds: Continuous Infusions:  sodium chloride 75 mL/hr at 05/10/23 0315    Nutritional status     Body mass index is 29.97 kg/m.  Data Reviewed:   CBC: Recent Labs  Lab 05/09/23 1750 05/09/23 1757 05/10/23 0555  WBC 10.4  --  11.2*  NEUTROABS 8.7*  --  7.1  HGB 14.9 16.3* 13.5  HCT 46.6* 48.0* 43.3  MCV 96.3  --  96.4  PLT 275  --  251   Basic Metabolic Panel: Recent Labs  Lab 05/09/23 1750 05/09/23 1757 05/10/23 0555  NA 137 141 138  K 4.0 4.0 3.6  CL 102 102 101  CO2 25  --  25  GLUCOSE 161* 149* 103*  BUN 18 19 15   CREATININE 0.93 0.90 0.84  CALCIUM 8.9  --  8.3*  MG  --   --  1.9   GFR: Estimated Creatinine Clearance: 65.8 mL/min (by C-G formula based on SCr of 0.84 mg/dL). Liver Function Tests: Recent  Labs  Lab 05/09/23 1750 05/10/23 0555  AST 36 29  ALT 41 33  ALKPHOS 36* 31*  BILITOT 0.9 0.7  PROT 7.7 6.7  ALBUMIN 4.1 3.4*   No results for input(s): "LIPASE", "AMYLASE" in the last 168 hours. No results for input(s): "AMMONIA" in the last 168 hours. Coagulation Profile: No results for input(s): "INR", "PROTIME" in the last 168 hours. Cardiac Enzymes: No results for input(s): "CKTOTAL", "CKMB", "CKMBINDEX", "TROPONINI" in the last 168 hours. BNP (last 3 results) No results for input(s): "PROBNP" in the last 8760 hours. HbA1C: No results for input(s): "HGBA1C" in the last 72 hours. CBG: No results for input(s): "GLUCAP" in the last 168  hours. Lipid Profile: No results for input(s): "CHOL", "HDL", "LDLCALC", "TRIG", "CHOLHDL", "LDLDIRECT" in the last 72 hours. Thyroid Function Tests: No results for input(s): "TSH", "T4TOTAL", "FREET4", "T3FREE", "THYROIDAB" in the last 72 hours. Anemia Panel: No results for input(s): "VITAMINB12", "FOLATE", "FERRITIN", "TIBC", "IRON", "RETICCTPCT" in the last 72 hours. Sepsis Labs: Recent Labs  Lab 05/09/23 1750 05/09/23 1921  LATICACIDVEN 1.4 1.9    Recent Results (from the past 240 hour(s))  Surgical PCR screen     Status: None   Collection Time: 05/10/23  3:28 AM   Specimen: Nasal Mucosa; Nasal Swab  Result Value Ref Range Status   MRSA, PCR NEGATIVE NEGATIVE Final   Staphylococcus aureus NEGATIVE NEGATIVE Final    Comment: (NOTE) The Xpert SA Assay (FDA approved for NASAL specimens in patients 40 years of age and older), is one component of a comprehensive surveillance program. It is not intended to diagnose infection nor to guide or monitor treatment. Performed at [facility], [address], [place], [place] [address]          Radiology Studies: DG Abd Portable 1 View  Result Date: 05/09/2023 CLINICAL DATA:  Check gastric catheter placement EXAM: PORTABLE ABDOMEN - 1 VIEW COMPARISON:  None Available. FINDINGS: Gastric catheter is noted within the stomach. No free air is seen. The dilated loops of small bowel are not well appreciated on this exam. IMPRESSION: Gastric catheter within the stomach. Electronically Signed   By: Alcide Clever M.D.   On: 05/09/2023 22:54   CT ABDOMEN PELVIS W CONTRAST  Result Date: 05/09/2023 CLINICAL DATA:  Right groin pain, known hernia EXAM: CT ABDOMEN AND PELVIS WITH CONTRAST TECHNIQUE: Multidetector CT imaging of the abdomen and pelvis was performed using the standard protocol following bolus administration of intravenous contrast. RADIATION DOSE REDUCTION: This exam was performed according to the departmental dose-optimization program  which includes automated exposure control, adjustment of the mA and/or kV according to patient size and/or use of iterative reconstruction technique. CONTRAST:  OMNIPAQUE IOHEXOL 300 MG/ML  SOLN COMPARISON:  04/19/2023 FINDINGS: Lower chest: Bilateral lower lobe scarring/atelectasis. Hepatobiliary: Scattered hepatic cysts measuring up to 15 mm inferiorly in the left hepatic lobe (series 2/image 29). Status post cholecystectomy. No intrahepatic ductal dilatation. Dilated common duct, measuring 12 mm centrally, and smoothly tapering at the ampulla, likely postsurgical. Pancreas: 11 mm unilocular cyst along the inferior aspect of the pancreatic body (series 2/image 28), likely a small pseudocyst or side branch IPMN. No associated parenchymal atrophy or main pancreatic ductal dilatation. Spleen: Within normal limits Adrenals/Urinary Tract: Adrenal glands are within normal limits. Multiple bilateral renal cysts, most of which measure simple fluid density, measuring to 2.7 cm the posterior left lower kidney (series 2/image 31), benign (Bosniak I). Additional 1.8 cm hyperdense lesion in the anterior right upper kidney (series  2/image 24) and 1.9 cm hyperdense lesion in the left lower renal sinus (series 2/image 36), indeterminate but favoring a benign hemorrhagic cysts. No renal, ureteral, or bladder calculi.  No hydronephrosis. Bladder is within normal limits. Stomach/Bowel: Stomach is notable for a 14 mm fat density lesion along the greater curvature of the stomach (series 2/image 17), suggesting a small lipoma. Dilated loops of small bowel in the left mid/lower abdomen (series 2/image 87), with transition entering a moderate right spigelian hernia (series 2/image 84), compatible with small bowel obstruction. Decompressed loops of distal small bowel within and exiting the hernia, with mild mesenteric stranding (series 2/image 60). No pneumatosis or free air. Appendix is not discretely visualized. Scattered colonic  diverticulosis, without evidence of diverticulitis. Vascular/Lymphatic: No evidence of abdominal aortic aneurysm. Atherosclerotic calcifications of the abdominal aorta and branch vessels. No suspicious abdominopelvic lymphadenopathy. Reproductive: Status post hysterectomy. Bilateral ovaries are within normal limits. Other: Small fat containing bilateral inguinal hernias (series 2/image 80). Musculoskeletal: Mild degenerative changes of the lower thoracic spine. IMPRESSION: Small bowel obstruction on the basis of a moderate right spigelian hernia. Mild mesenteric stranding within the hernia, raising concern for incarceration. No pneumatosis or free air. Surgical consultation is suggested. 11 mm unilocular cyst along the inferior aspect of the pancreatic body, likely a small pseudocyst or side branch IPMN. Follow-up MRI abdomen with/without contrast is suggested in 2 years. Additional ancillary findings as above. Electronically Signed   By: Charline Bills M.D.   On: 05/09/2023 20:09           LOS: 1 day   Time spent= 35 mins    Miguel Rota, MD [facility]  If 7PM-7AM, please contact night-coverage  05/10/2023, 8:52 AM

## 2023-05-10 NOTE — Plan of Care (Signed)

## 2023-05-10 NOTE — Plan of Care (Signed)
  Problem: Education: Goal: Knowledge of General Education information will improve Description Including pain rating scale, medication(s)/side effects and non-pharmacologic comfort measures Outcome: Progressing   Problem: Health Behavior/Discharge Planning: Goal: Ability to manage health-related needs will improve Outcome: Progressing   

## 2023-05-10 NOTE — Assessment & Plan Note (Signed)
Continue statin. 

## 2023-05-10 NOTE — H&P (Signed)
History and Physical    Patient: [name] NLZ:[id number] DOB: [birth year] DOA: 05/09/2023 DOS: the patient was seen and examined on 05/10/2023 PCP: Gwenlyn Found, MD  Patient coming from: Home  Chief Complaint:  Chief Complaint  Patient presents with   Hernia   HPI: [name] is a very sweet 72 y.o. female with medical history significant of paroxysmal atrial fibrillation, depression and anxiety, hyperlipidemia, hypertension, history of stroke, history of MI, who presents to the ED with a chief complaint of abdominal pain.  Patient was diagnosed with a hernia over Labor Day weekend when she was having the same abdominal pain.  She reports at that time she was discharged from the ER still feeling nauseous and vomiting.  They had told her to put ice on the area that hurt, so she has been doing that and it has been helping.  Today the pain came back much worse.  It is in her right lower quadrant.  Sharp and pressure and burning.  She reports it was progressively worse throughout the day.  By lunchtime she was almost crying.  She reports nausea and vomiting x 1.  No coffee ground emesis at home.  Her emesis appears clear liquid.  She reports her last normal meal was Sunday.  Her last normal bowel movement was Monday morning.  She has been passing flatulence.  She has had no fever.  She has no complaints right now except for sore throat from her NG tube.  Patient does not smoke and does not drink.  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  She clarified that this means she would rather have a natural death. Review of Systems: As mentioned in the history of present illness. All other systems reviewed and are negative. Past Medical History:  Diagnosis Date   Depression    Diabetes mellitus without complication (HCC)    Hyperlipidemia 10/19/2018   Hypertension    Hypothyroidism    Stroke (HCC) 10/19/2018   Past Surgical History:   Procedure Laterality Date   APPENDECTOMY     BACK SURGERY     CHOLECYSTECTOMY     CORONARY PRESSURE/FFR STUDY N/A 05/26/2020   Procedure: INTRAVASCULAR PRESSURE WIRE/FFR STUDY;  Surgeon: Swaziland, Peter M, MD;  Location: [facility] INVASIVE CV LAB;  Service: Cardiovascular; iFR/FFR of 65% mLAD -  iFR (0.93) and FFR (0.84) = NOT HEMODYNAMICALLY/ PHYSIOLOGICALLY SIGNIFICANT.    LEFT HEART CATH AND CORONARY ANGIOGRAPHY N/A 05/26/2020   Procedure: LEFT HEART CATH AND CORONARY ANGIOGRAPHY;  Surgeon: Swaziland, Peter M, MD;  Location: [facility] INVASIVE CV LAB;  Service: Cardiovascular; non-STEMI = Mid LAD 65%.  FFR and IFR both negative for hemodynamic significance.  Mildly elevated LVEDP. ->  Recommend medical management.   TRANSTHORACIC ECHOCARDIOGRAM  05/24/2020   In setting of non-STEMI: EF 60 to 65%.  No R WMA.  "Normal "diastolic parameters.  Normal RV.  Mild MR.  Mild aortic valve sclerosis without stenosis.  (Poor quality)   VAGINAL HYSTERECTOMY     Social History:  reports that she has never smoked. She has never used smokeless tobacco. She reports that she does not drink alcohol and does not use drugs.  Allergies  Allergen Reactions   Tape Rash    Can only tolerate LIMITED exposure; no "plastic tape," please   Wound Dressing Adhesive Rash    Can only tolerate LIMITED exposure; no "plastic tape," please    Family History  Problem Relation Age of Onset   Coronary artery disease Mother        stent age 16, died at age 75 of complications from diabetes/heart disease   Diabetes Mother    Colon cancer Father        died at 38   Breast cancer Sister        65s   Breast cancer Daughter 37   Throat cancer Brother    Arrhythmia Brother     Prior to Admission medications   Medication Sig Start Date End Date Taking? Authorizing Provider  alendronate (FOSAMAX) 70 MG tablet Take 70 mg by mouth once a week.   Yes [provider]  ALPRAZolam Prudy Feeler) 0.5 MG tablet Take 0.5 mg by mouth daily as needed  for anxiety.  03/20/18  Yes [provider]  ARTIFICIAL TEAR OP Place 1 drop into both eyes daily as needed (dry eyes).   Yes [provider]  Calcium Carb-Cholecalciferol (OYSTER SHELL CALCIUM 250+D) 250-3.125 MG-MCG TABS Take 1 tablet by mouth daily.   Yes [provider]  citalopram (CELEXA) 20 MG tablet Take 20 mg by mouth daily.   Yes [provider]  ELIQUIS 5 MG TABS tablet TAKE 1 TABLET(5 MG) BY MOUTH TWICE DAILY 03/15/22  Yes Crenshaw, Madolyn Frieze, MD  metoprolol tartrate (LOPRESSOR) 25 MG tablet Take 1 tablet (25 mg total) by mouth 2 (two) times daily. 05/26/20  Yes Burnadette Pop, MD  rosuvastatin (CRESTOR) 40 MG tablet Take 1 tablet (40 mg total) by mouth at bedtime. 05/26/20  Yes Adhikari, Willia Craze, MD  SYNTHROID 88 MCG tablet Take 88 mcg by mouth daily.   Yes [provider]  Vitamin D, Ergocalciferol, (DRISDOL) 1.25 MG (50000 UNIT) CAPS capsule Take 50,000 Units by mouth once a week.   Yes [provider]    Physical Exam: Vitals:   05/09/23 2130 05/09/23 2200 05/09/23 2326 05/10/23 0330  BP: 123/63 125/62 (!) 113/93 122/63  Pulse: 71 69 79 71  Resp: 15 17 18 18   Temp:   98.1 F (36.7 C) 98 F (36.7 C)  TempSrc:      SpO2: 90% 93% 96% 96%  Weight:   81.7 kg   Height:   5\' 5"  (1.651 m)    1.  General: Patient lying supine in bed,  no acute distress   2. Psychiatric: Alert and oriented x 3, mood and behavior normal for situation, pleasant and cooperative with exam   3. Neurologic: Speech and language are normal, face is symmetric, moves all 4 extremities voluntarily, at baseline without acute deficits on limited exam   4. HEENMT:  Head is atraumatic, normocephalic, pupils reactive to light, neck is supple, trachea is midline, mucous membranes are moist   5. Respiratory : Lungs are clear to auscultation bilaterally without wheezing, rhonchi, rales, no cyanosis, no increase in work of breathing or accessory muscle use   6.  Cardiovascular : Heart rate normal, rhythm is regular, no murmurs, rubs or gallops, no peripheral edema, peripheral pulses palpated   7. Gastrointestinal:  Abdomen is soft, nondistended, tissue tension abnormality in the abdominal wall in the right lower quadrant but no pain to palpation at this time, bowel sounds active, no masses or organomegaly palpated   8. Skin:  Skin is warm, dry and intact without rashes, acute lesions, or ulcers on limited exam   9.Musculoskeletal:  No acute deformities or trauma, no asymmetry in tone, no peripheral edema, peripheral pulses palpated, no tenderness to palpation  in the extremities  Data Reviewed: In the ED Temp 97.5-97.8, heart rate 47-67, respiratory rate 16-17, blood pressure stable Satting 89-97% No leukocytosis with a white blood cell count of 10.4, hemoglobin 14.9, platelets 275 Chemistry is unremarkable Lactic acid is unremarkable CT abdomen pelvis shows SBO with hernia concerning for incarceration She was given fentanyl, 2 L bolus, Zofran General surgery was consulted and recommended admission, NG tube, and washout.  For Eliquis Assessment and Plan: * SBO (small bowel obstruction) (HCC) - CT scan showed small bowel obstruction with hernia concerning for incarceration - Reduced in the ED - General Surgery consulted and will see patient in the a.m. - Surgery may be delayed due to washout period for Eliquis - NG tube at low intermittent suction - Coffee ground output from NG tube - Trend hemoglobin this a.m. - Previously hemoglobin stable at 14.9 - Continue to monitor  Paroxysmal atrial fibrillation (HCC) - Continue metoprolol - Holding Eliquis in the setting of coffee-ground GI output and possible surgery  Hyperlipidemia - Continue statin  Depression - Depression and anxiety - Continue Celexa and Xanax  Hypertension - Continue metoprolol      Advance Care Planning:   Code Status: Limited: Do not attempt resuscitation  (DNR) -DNR-LIMITED -Do Not Intubate/DNI   Consults: General  Fam no family at bedside ily Communication:   Severity of Illness: The appropriate patient status for this patient is INPATIENT. Inpatient status is judged to be reasonable and necessary in order to provide the required intensity of service to ensure the patient's safety. The patient's presenting symptoms, physical exam findings, and initial radiographic and laboratory data in the context of their chronic comorbidities is felt to place them at high risk for further clinical deterioration. Furthermore, it is not anticipated that the patient will be medically stable for discharge from the hospital within 2 midnights of admission.   * I certify that at the point of admission it is my clinical judgment that the patient will require inpatient hospital care spanning beyond 2 midnights from the point of admission due to high intensity of service, high risk for further deterioration and high frequency of surveillance required.*  Author: Lilyan Gilford, DO 05/10/2023 6:22 AM  For on call review [email or web address].

## 2023-05-11 ENCOUNTER — Inpatient Hospital Stay (HOSPITAL_COMMUNITY): Payer: 59

## 2023-05-11 ENCOUNTER — Encounter (HOSPITAL_COMMUNITY)
Admission: EM | Disposition: A | Discharge status: Home / Self Care | Payer: Self-pay | Source: Home / Self Care | Attending: Internal Medicine

## 2023-05-11 ENCOUNTER — Encounter (HOSPITAL_COMMUNITY): Payer: Self-pay | Admitting: Family Medicine

## 2023-05-11 ENCOUNTER — Inpatient Hospital Stay (HOSPITAL_COMMUNITY): Payer: 59 | Admitting: Certified Registered"

## 2023-05-11 ENCOUNTER — Other Ambulatory Visit: Payer: Self-pay

## 2023-05-11 DIAGNOSIS — K436 Other and unspecified ventral hernia with obstruction, without gangrene: Secondary | ICD-10-CM

## 2023-05-11 DIAGNOSIS — E785 Hyperlipidemia, unspecified: Secondary | ICD-10-CM

## 2023-05-11 DIAGNOSIS — K56609 Unspecified intestinal obstruction, unspecified as to partial versus complete obstruction: Secondary | ICD-10-CM | POA: Diagnosis not present

## 2023-05-11 DIAGNOSIS — E039 Hypothyroidism, unspecified: Secondary | ICD-10-CM | POA: Diagnosis not present

## 2023-05-11 DIAGNOSIS — I10 Essential (primary) hypertension: Secondary | ICD-10-CM | POA: Diagnosis not present

## 2023-05-11 HISTORY — PX: XI ROBOTIC ASSISTED VENTRAL HERNIA: SHX6789

## 2023-05-11 LAB — GLUCOSE, CAPILLARY
Glucose-Capillary: 101 mg/dL — ABNORMAL HIGH (ref 70–99)
Glucose-Capillary: 117 mg/dL — ABNORMAL HIGH (ref 70–99)
Glucose-Capillary: 125 mg/dL — ABNORMAL HIGH (ref 70–99)
Glucose-Capillary: 131 mg/dL — ABNORMAL HIGH (ref 70–99)

## 2023-05-11 SURGERY — REPAIR, HERNIA, VENTRAL, ROBOT-ASSISTED
Anesthesia: General

## 2023-05-11 MED ORDER — ORAL CARE MOUTH RINSE: 15.0000 mL | Freq: Once | OROMUCOSAL | Status: AC

## 2023-05-11 MED ORDER — MIDAZOLAM HCL 2 MG/2ML IJ SOLN
INTRAMUSCULAR | Status: AC
  Filled 2023-05-11: qty 2

## 2023-05-11 MED ORDER — FENTANYL CITRATE PF 50 MCG/ML IJ SOSY: 25.0000 ug | PREFILLED_SYRINGE | INTRAMUSCULAR | Status: DC | PRN

## 2023-05-11 MED ORDER — PROPOFOL 10 MG/ML IV BOLUS
INTRAVENOUS | Status: DC | PRN
  Administered 2023-05-11: 200 mg via INTRAVENOUS

## 2023-05-11 MED ORDER — ONDANSETRON HCL 4 MG/2ML IJ SOLN: 4.0000 mg | Freq: Once | INTRAMUSCULAR | Status: DC | PRN

## 2023-05-11 MED ORDER — LACTATED RINGERS IV SOLN: INTRAVENOUS | Status: DC

## 2023-05-11 MED ORDER — PHENYLEPHRINE HCL (PRESSORS) 10 MG/ML IV SOLN
INTRAVENOUS | Status: DC | PRN
  Administered 2023-05-11 (×5): 160 ug via INTRAVENOUS

## 2023-05-11 MED ORDER — FENTANYL CITRATE (PF) 100 MCG/2ML IJ SOLN
INTRAMUSCULAR | Status: DC | PRN
  Administered 2023-05-11: 100 ug via INTRAVENOUS

## 2023-05-11 MED ORDER — STERILE WATER FOR IRRIGATION IR SOLN
Status: DC | PRN
Start: 2023-05-11 — End: 2023-05-11
  Administered 2023-05-11: 1000 mL

## 2023-05-11 MED ORDER — METOPROLOL TARTRATE 5 MG/5ML IV SOLN
INTRAVENOUS | Status: AC
  Filled 2023-05-11: qty 5

## 2023-05-11 MED ORDER — CHLORHEXIDINE GLUCONATE 0.12 % MT SOLN
15.0000 mL | Freq: Once | OROMUCOSAL | Status: AC
  Administered 2023-05-11: 15 mL via OROMUCOSAL
  Filled 2023-05-11: qty 15

## 2023-05-11 MED ORDER — EPHEDRINE SULFATE (PRESSORS) 50 MG/ML IJ SOLN
INTRAMUSCULAR | Status: DC | PRN
  Administered 2023-05-11: 5 mg via INTRAVENOUS
  Administered 2023-05-11: 10 mg via INTRAVENOUS

## 2023-05-11 MED ORDER — SUCCINYLCHOLINE CHLORIDE 200 MG/10ML IV SOSY
PREFILLED_SYRINGE | INTRAVENOUS | Status: AC
  Filled 2023-05-11: qty 10

## 2023-05-11 MED ORDER — PROPOFOL 10 MG/ML IV BOLUS
INTRAVENOUS | Status: AC
  Filled 2023-05-11: qty 20

## 2023-05-11 MED ORDER — MIDAZOLAM HCL 5 MG/5ML IJ SOLN
INTRAMUSCULAR | Status: DC | PRN
  Administered 2023-05-11: 1 mg via INTRAVENOUS

## 2023-05-11 MED ORDER — FENTANYL CITRATE (PF) 100 MCG/2ML IJ SOLN
INTRAMUSCULAR | Status: AC
  Filled 2023-05-11: qty 2

## 2023-05-11 MED ORDER — LIDOCAINE HCL (PF) 2 % IJ SOLN
INTRAMUSCULAR | Status: AC
  Filled 2023-05-11: qty 5

## 2023-05-11 MED ORDER — OXYCODONE HCL 5 MG/5ML PO SOLN: 5.0000 mg | Freq: Once | ORAL | Status: DC | PRN

## 2023-05-11 MED ORDER — BUPIVACAINE HCL (PF) 0.5 % IJ SOLN
INTRAMUSCULAR | Status: AC
  Filled 2023-05-11: qty 30

## 2023-05-11 MED ORDER — ROCURONIUM BROMIDE 10 MG/ML (PF) SYRINGE
PREFILLED_SYRINGE | INTRAVENOUS | Status: AC
  Filled 2023-05-11: qty 10

## 2023-05-11 MED ORDER — OXYCODONE HCL 5 MG PO TABS: 5.0000 mg | ORAL_TABLET | Freq: Once | ORAL | Status: DC | PRN

## 2023-05-11 MED ORDER — BUPIVACAINE HCL (PF) 0.5 % IJ SOLN
INTRAMUSCULAR | Status: DC | PRN
  Administered 2023-05-11: 30 mL

## 2023-05-11 MED ORDER — SUGAMMADEX SODIUM 500 MG/5ML IV SOLN
INTRAVENOUS | Status: DC | PRN
  Administered 2023-05-11: 200 mg via INTRAVENOUS

## 2023-05-11 MED ORDER — SUCCINYLCHOLINE CHLORIDE 200 MG/10ML IV SOSY
PREFILLED_SYRINGE | INTRAVENOUS | Status: DC | PRN
  Administered 2023-05-11: 100 mg via INTRAVENOUS

## 2023-05-11 MED ORDER — SCOPOLAMINE 1 MG/3DAYS TD PT72
1.0000 | MEDICATED_PATCH | Freq: Once | TRANSDERMAL | Status: DC
  Administered 2023-05-11: 1.5 mg via TRANSDERMAL
  Filled 2023-05-11: qty 1

## 2023-05-11 SURGICAL SUPPLY — 48 items
ADH SKN CLS APL DERMABOND .7 (GAUZE/BANDAGES/DRESSINGS) ×1
APL PRP STRL LF DISP 70% ISPRP (MISCELLANEOUS) ×1
BLADE SURG 15 STRL LF DISP TIS (BLADE) ×1 IMPLANT
BLADE SURG 15 STRL SS (BLADE) ×1
CHLORAPREP W/TINT 26 (MISCELLANEOUS) ×1 IMPLANT
COVER LIGHT HANDLE STERIS (MISCELLANEOUS) ×2 IMPLANT
COVER MAYO STAND XLG (MISCELLANEOUS) ×2 IMPLANT
COVER TIP SHEARS 8 DVNC (MISCELLANEOUS) ×1 IMPLANT
DERMABOND ADVANCED .7 DNX12 (GAUZE/BANDAGES/DRESSINGS) ×1 IMPLANT
DRAPE ARM DVNC X/XI (DISPOSABLE) ×3 IMPLANT
DRAPE COLUMN DVNC XI (DISPOSABLE) ×1 IMPLANT
DRIVER NDL MEGA SUTCUT DVNCXI (INSTRUMENTS) ×1 IMPLANT
DRIVER NDLE MEGA SUTCUT DVNCXI (INSTRUMENTS) ×1 IMPLANT
ELECT REM PT RETURN 9FT ADLT (ELECTROSURGICAL) ×1
ELECTRODE REM PT RTRN 9FT ADLT (ELECTROSURGICAL) ×1 IMPLANT
FORCEPS BPLR R/ABLATION 8 DVNC (INSTRUMENTS) ×1 IMPLANT
GLOVE BIO SURGEON STRL SZ 6.5 (GLOVE) ×2 IMPLANT
GLOVE BIOGEL PI IND STRL 6.5 (GLOVE) ×2 IMPLANT
GLOVE BIOGEL PI IND STRL 7.0 (GLOVE) ×4 IMPLANT
GOWN STRL REUS W/TWL LRG LVL3 (GOWN DISPOSABLE) ×3 IMPLANT
GRASPER SUT TROCAR 14GX15 (MISCELLANEOUS) IMPLANT
IRRIGATOR SUCT 8 DISP DVNC XI (IRRIGATION / IRRIGATOR) IMPLANT
KIT PINK PAD W/HEAD ARE REST (MISCELLANEOUS) ×1
KIT PINK PAD W/HEAD ARM REST (MISCELLANEOUS) ×1 IMPLANT
KIT TURNOVER KIT A (KITS) ×1 IMPLANT
MANIFOLD NEPTUNE II (INSTRUMENTS) ×1 IMPLANT
MESH VENTRALIGHT ST 4.5IN (Mesh General) IMPLANT
NDL HYPO 21X1.5 SAFETY (NEEDLE) ×1 IMPLANT
NDL INSUFFLATION 14GA 120MM (NEEDLE) ×1 IMPLANT
NEEDLE HYPO 21X1.5 SAFETY (NEEDLE) ×1 IMPLANT
NEEDLE INSUFFLATION 14GA 120MM (NEEDLE) ×1 IMPLANT
OBTURATOR OPTICAL STND 8 DVNC (TROCAR) ×1
OBTURATOR OPTICALSTD 8 DVNC (TROCAR) ×1 IMPLANT
PACK LAP CHOLE LZT030E (CUSTOM PROCEDURE TRAY) ×1 IMPLANT
PENCIL HANDSWITCHING (ELECTRODE) ×1 IMPLANT
POSITIONER HEAD 8X9X4 ADT (SOFTGOODS) ×1 IMPLANT
SCISSORS MNPLR CVD DVNC XI (INSTRUMENTS) ×1 IMPLANT
SEAL UNIV 5-12 XI (MISCELLANEOUS) ×3 IMPLANT
SET BASIN LINEN APH (SET/KITS/TRAYS/PACK) ×1 IMPLANT
SET TUBE SMOKE EVAC HIGH FLOW (TUBING) ×1 IMPLANT
SUT MNCRL AB 4-0 PS2 18 (SUTURE) ×1 IMPLANT
SUT STRATAFIX PDS 30 CT-1 (SUTURE) ×1 IMPLANT
SUT V-LOC 90 ABS 3-0 VLT V-20 (SUTURE) ×2 IMPLANT
SYR 30ML LL (SYRINGE) ×1 IMPLANT
TAPE TRANSPORE STRL 2 31045 (GAUZE/BANDAGES/DRESSINGS) ×2 IMPLANT
TRAY FOLEY W/BAG SLVR 16FR (SET/KITS/TRAYS/PACK)
TRAY FOLEY W/BAG SLVR 16FR ST (SET/KITS/TRAYS/PACK) IMPLANT
WATER STERILE IRR 500ML POUR (IV SOLUTION) ×1 IMPLANT

## 2023-05-11 NOTE — Interval H&P Note (Signed)
History and Physical Interval Note:  05/11/2023 3:13 PM  [name]  has presented today for surgery, with the diagnosis of ventral hernia.  The various methods of treatment have been discussed with the patient and family. After consideration of risks, benefits and other options for treatment, the patient has consented to  Procedure(s): XI ROBOTIC ASSISTED VENTRAL HERNIA (N/A) as a surgical intervention.  The patient's history has been reviewed, patient examined, no change in status, stable for surgery.  I have reviewed the patient's chart and labs.  Questions were answered to the patient's satisfaction.     Lucretia Roers

## 2023-05-11 NOTE — Plan of Care (Signed)

## 2023-05-11 NOTE — Progress Notes (Signed)
[facility]  Updated son, Christiane Ha. NG until bowel function. Hold Eliquis for now. Foley removed in the OR.   Algis Greenhouse, MD [facility] [address] [name], [place] [address] [phone] (office)

## 2023-05-11 NOTE — Progress Notes (Signed)
Patient presented with some anxiety at bedtime, PRN meds effective. Patient rested on and off through the night. Up to bathroom with assist. CHG bath completed this morning, informed consent in chart. Patient resting in bed at this time, call bell within reach.

## 2023-05-11 NOTE — Anesthesia Procedure Notes (Signed)
Procedure Name: Intubation Date/Time: 05/11/2023 3:58 PM  Performed by: Windell Norfolk, MDPre-anesthesia Checklist: Patient identified, Emergency Drugs available, Suction available, Patient being monitored and Timeout performed Patient Re-evaluated:Patient Re-evaluated prior to induction Oxygen Delivery Method: Circle system utilized Preoxygenation: Pre-oxygenation with 100% oxygen Induction Type: IV induction Laryngoscope Size: Glidescope and 3 Grade View: Grade I Tube type: Oral Tube size: 7.0 mm Number of attempts: 2 Airway Equipment and Method: Video-laryngoscopy and Stylet Placement Confirmation: ETT inserted through vocal cords under direct vision, positive ETCO2 and breath sounds checked- equal and bilateral Secured at: 22 cm Tube secured with: Tape Dental Injury: Teeth and Oropharynx as per pre-operative assessment  Difficulty Due To: Difficulty was unanticipated Future Recommendations: Recommend- induction with short-acting agent, and alternative techniques readily available

## 2023-05-11 NOTE — Op Note (Addendum)
[facility] Operative Note  05/11/23  Preoperative Diagnosis: Incarcerated right lower abdominal wall ventral hernia    Postoperative Diagnosis: Same   Procedure(s) Performed: Robotic assisted laparoscopic ventral hernia with Ventralight 11.4cm mesh, defect size 4.5cm   Surgeon: Michaela Jewels. Henreitta Leber, MD   Assistants: No qualified resident was available    Anesthesia: General endotracheal   Anesthesiologist: Windell Norfolk, MD    Specimens: None    Estimated Blood Loss: Minimal   Blood Replacement: None    Complications: None   Wound Class: Clean   Operative Indications: [name] is a 72 yo with a history of an open gallbladder and appendectomy with drain placement in the right lower abdomen at that time 40 years ago. She has developed a hernia in that area of the drain and this had bowel in it in the ED with concern for obstruction. She was admitted for NG decompression and repair. We discussed robotic laparoscopic assisted ventral hernia repair with mesh and risk of open procedure, risk of bleeding, infection, recurrence. Her Eliquis had to wear off and she was kept for two nights to allow for this to happen.   Findings: 4.5 defect Tension free repair achieved with Ventralight ST Circular 11.4cm mesh and suture Adequate hemostasis    Procedure: The patient was brought to the operating room and general anesthesia was induced. A time-out was completed verifying correct patient, procedure, site, positioning, and implant(s) and/or special equipment prior to beginning this procedure. Antibiotics were administered prior to making the incision. SCDs placed. The anterior abdominal wall was prepped and draped in the standard sterile fashion. She had an NG and a foley placed.   At Palmer's point, a Veress needle placed and the saline drop test was performed. The abdomen was insufflated to 15cm without issues. Using that incision which I extended a left upper abdominal  port was placed.  Additional lateral ports on the left side, placing the 8mm central camera port and a left lower abdominal 8mm port, each at least 4cm apart from each other and over 8cm from the hernia defect.  The left upper port was upsized to a 12 mm port to help with mesh and suture placement. The Sunset robot then docked into place.  The hernia was noted to be in the right lower abdomen. The hernia contents noted and reduced with combination of blunt, sharp dissection with scissors and forced bipolar forceps.  Hemostasis achieved throughout this portion.  Once all hernia contents reduced, there was noted to be a 4.5 hernia defect.  Any fat overlying the peritoneal layer was taken down creating flaps to clear the space for adequate fixation of the mesh.   The Ventralight ST 11.4 cm circular mesh and 3 sutures were placed in the abdominal cavity under direct visualization.  A transfacial suture with 0 stratafix used to primarily close defect under minimal tension. The stratafix was then passed through the center of the mesh (coated side out) to secured the mesh to the abdominal wall centered over the defect.  The mesh was then circumferentially sutured into the anterior abdominal wall using 3-0 VLock x4 (two additional were placed into the abdomen under direct visualization as the first two were cut while suturing).  Any bleeding noted during this portion was no longer actively bleeding by end of securing mesh and tightening the suture.    All 5 needles and remaining suture were removed under direct visualization through the 12 mm port.    The robot was  undocked.  The 12 mm port  was closed with a PMI and a 0 Vicryl suture under direct visualization. The abdomen was then de-sufflated and the ports were removed. All skin incisions closed with subcuticular 4-0 Monocryl and dermabond. Final inspection revealed acceptable hemostasis.   All counts were correct at the end of the case. The patient was awakened  from anesthesia and extubated without complication.  The patient went to the PACU in stable condition.The foley was removed and the NG was left in place.    Michaela Greenhouse, MD [facility] [name] [place], [place] [address] [phone] (office)

## 2023-05-11 NOTE — Anesthesia Preprocedure Evaluation (Signed)
Anesthesia Evaluation  Patient identified by MRN, date of birth, ID band Patient awake    Reviewed: Allergy & Precautions, H&P , NPO status , Patient's Chart, lab work & pertinent test results, reviewed documented beta blocker date and time   Airway Mallampati: II  TM Distance: >3 FB Neck ROM: full    Dental no notable dental hx.    Pulmonary neg pulmonary ROS   Pulmonary exam normal breath sounds clear to auscultation       Cardiovascular Exercise Tolerance: Good hypertension, + Past MI  negative cardio ROS  Rhythm:regular Rate:Normal     Neuro/Psych  PSYCHIATRIC DISORDERS  Depression    TIACVA negative neurological ROS  negative psych ROS   GI/Hepatic negative GI ROS, Neg liver ROS,,,  Endo/Other  negative endocrine ROSdiabetesHypothyroidism    Renal/GU negative Renal ROS  negative genitourinary   Musculoskeletal   Abdominal   Peds  Hematology negative hematology ROS (+)   Anesthesia Other Findings   Reproductive/Obstetrics negative OB ROS                             Anesthesia Physical Anesthesia Plan  ASA: 3  Anesthesia Plan: General and General ETT   Post-op Pain Management:    Induction:   PONV Risk Score and Plan: Ondansetron and Scopolamine patch - Pre-op  Airway Management Planned:   Additional Equipment:   Intra-op Plan:   Post-operative Plan:   Informed Consent: I have reviewed the patients History and Physical, chart, labs and discussed the procedure including the risks, benefits and alternatives for the proposed anesthesia with the patient or authorized representative who has indicated his/her understanding and acceptance.     Dental Advisory Given  Plan Discussed with: CRNA  Anesthesia Plan Comments:        Anesthesia Quick Evaluation

## 2023-05-11 NOTE — Transfer of Care (Signed)
Immediate Anesthesia Transfer of Care Note  Patient: [name]  Procedure(s) Performed: XI ROBOTIC ASSISTED VENTRAL HERNIA  Patient Location: PACU  Anesthesia Type:General  Level of Consciousness: awake and alert   Airway & Oxygen Therapy: Patient Spontanous Breathing  Post-op Assessment: Report given to RN and Post -op Vital signs reviewed and stable  Post vital signs: Reviewed and stable  Last Vitals:  Vitals Value Taken Time  BP 104/60 05/11/23 1807  Temp 36.6 C 05/11/23 1806  Pulse 114 05/11/23 1812  Resp 12 05/11/23 1812  SpO2 92 % 05/11/23 1812  Vitals shown include unfiled device data.  Last Pain:  Vitals:   05/11/23 1445  TempSrc:   PainSc: 0-No pain         Complications:  Encounter Notable Events  Notable Event Outcome Phase Comment  Difficult to intubate - unexpected  Intraprocedure Filed from anesthesia note documentation.

## 2023-05-11 NOTE — Progress Notes (Signed)
Mobility Specialist Progress Note:    05/11/23 1020  Mobility  Activity Ambulated with assistance in hallway  Level of Assistance Standby assist, set-up cues, supervision of patient - no hands on  Assistive Device None  Distance Ambulated (ft) 350 ft  Range of Motion/Exercises Active;All extremities  Activity Response Tolerated well  Mobility Referral Yes  $Mobility charge 1 Mobility  Mobility Specialist Start Time (ACUTE ONLY) 1020  Mobility Specialist Stop Time (ACUTE ONLY) 1030  Mobility Specialist Time Calculation (min) (ACUTE ONLY) 10 min   Pt received in bed, agreeable to mobility. Required supervision to stand and ambulate with no AD. Tolerated well, asx throughout. Returned pt to bed, all needs met.   Lawerance Bach Mobility Specialist Please contact via Special educational needs teacher or  Rehab office at [phone]

## 2023-05-11 NOTE — Anesthesia Postprocedure Evaluation (Signed)
Anesthesia Post Note  Patient: [name]  Procedure(s) Performed: XI ROBOTIC ASSISTED VENTRAL HERNIA  Patient location during evaluation: Phase II Anesthesia Type: General Level of consciousness: awake Pain management: pain level controlled Vital Signs Assessment: post-procedure vital signs reviewed and stable Respiratory status: spontaneous breathing and respiratory function stable Cardiovascular status: blood pressure returned to baseline and stable Postop Assessment: no headache and no apparent nausea or vomiting Anesthetic complications: yes Comments: Late entry   Encounter Notable Events  Notable Event Outcome Phase Comment  Difficult to intubate - unexpected  Intraprocedure Filed from anesthesia note documentation.     Last Vitals:  Vitals:   05/11/23 1445 05/11/23 1806  BP: (!) 140/69 104/60  Pulse: 62 80  Resp: 11 12  Temp: 36.9 C 36.6 C  SpO2: 97% 94%    Last Pain:  Vitals:   05/11/23 1445  TempSrc:   PainSc: 0-No pain                 Windell Norfolk

## 2023-05-11 NOTE — Progress Notes (Signed)
PROGRESS NOTE    [name]  XBM:[id number] DOB: Aug 18, 1951 DOA: 05/09/2023 PCP: Gwenlyn Found, MD     Brief Narrative:  72 year old with history of paroxysmal A-fib on Eliquis, depression, anxiety, HTN, HLD, CVA, MI comes to the hospital abdominal pain.  Was diagnosed with hernia on Labor Day weekend and since then having abdominal pain with nausea.  CT showed SBO with hernia concerning for incarceration, general surgery consulted.  Plans for OR 9/18.     Assessment & Plan:  Principal Problem:   SBO (small bowel obstruction) (HCC) Active Problems:   Hypertension   Depression   Hyperlipidemia   Paroxysmal atrial fibrillation (HCC)   SBO (small bowel obstruction) (HCC) secondary to right-sided moderate spigelian hernia Continue n.p.o., D5 fluids, Accu-Chek.  Eliquis washout, plans for OR today for laparoscopic hernia repair   Paroxysmal atrial fibrillation (HCC) IV prn meds for now.  Eliquis is currently on hold   Hyperlipidemia Statin on hold   Depression Continue to hold p.o. meds   Hypertension IV as needed   DVT prophylaxis: SCDs Start: 05/09/23 2319 Code Status: DNR Family Communication:   Status is: Inpatient Remains inpatient appropriate because: Plans for OR today             Subjective:  Waiting for OR today.  No other complaints  Examination:  General exam: Appears calm and comfortable  Respiratory system: Clear to auscultation. Respiratory effort normal. Cardiovascular system: S1 & S2 heard, RRR. No JVD, murmurs, rubs, gallops or clicks. No pedal edema. Gastrointestinal system: Abdomen is nondistended, soft and nontender. No organomegaly or masses felt. Normal bowel sounds heard. Central nervous system: Alert and oriented. No focal neurological deficits. Extremities: Symmetric 5 x 5 power. Skin: No rashes, lesions or ulcers Psychiatry: Judgement and insight appear normal. Mood & affect appropriate.       Diet Orders (From  admission, onward)     Start     Ordered   05/11/23 0001  Diet NPO time specified  Diet effective midnight        05/10/23 1722            Objective: Vitals:   05/10/23 1344 05/10/23 1439 05/10/23 2052 05/11/23 0535  BP: 112/64  132/70 (!) 144/90  Pulse: (!) 59 62 62 72  Resp:  20    Temp:  98.7 F (37.1 C) 98.9 F (37.2 C) 98.7 F (37.1 C)  TempSrc:  Oral Oral   SpO2: 94% 96% 97% 96%  Weight:      Height:        Intake/Output Summary (Last 24 hours) at 05/11/2023 0945 Last data filed at 05/11/2023 0500 Gross per 24 hour  Intake 1065.48 ml  Output --  Net 1065.48 ml   Filed Weights   05/09/23 1648 05/09/23 2326  Weight: 79.3 kg 81.7 kg    Scheduled Meds: Continuous Infusions:  sodium chloride Stopped (05/10/23 1050)    ceFAZolin (ANCEF) IV      Nutritional status     Body mass index is 29.97 kg/m.  Data Reviewed:   CBC: Recent Labs  Lab 05/09/23 1750 05/09/23 1757 05/10/23 0555 05/11/23 0426  WBC 10.4  --  11.2* 8.7  NEUTROABS 8.7*  --  7.1  --   HGB 14.9 16.3* 13.5 12.7  HCT 46.6* 48.0* 43.3 41.6  MCV 96.3  --  96.4 97.4  PLT 275  --  251 227   Basic Metabolic Panel: Recent Labs  Lab 05/09/23 1750 05/09/23 1757 05/10/23  0555 05/11/23 0426  NA 137 141 138 139  K 4.0 4.0 3.6 4.0  CL 102 102 101 102  CO2 25  --  25 28  GLUCOSE 161* 149* 103* 124*  BUN 18 19 15 11   CREATININE 0.93 0.90 0.84 0.87  CALCIUM 8.9  --  8.3* 8.1*  MG  --   --  1.9 2.2   GFR: Estimated Creatinine Clearance: 63.5 mL/min (by C-G formula based on SCr of 0.87 mg/dL). Liver Function Tests: Recent Labs  Lab 05/09/23 1750 05/10/23 0555  AST 36 29  ALT 41 33  ALKPHOS 36* 31*  BILITOT 0.9 0.7  PROT 7.7 6.7  ALBUMIN 4.1 3.4*   No results for input(s): "LIPASE", "AMYLASE" in the last 168 hours. No results for input(s): "AMMONIA" in the last 168 hours. Coagulation Profile: No results for input(s): "INR", "PROTIME" in the last 168 hours. Cardiac  Enzymes: No results for input(s): "CKTOTAL", "CKMB", "CKMBINDEX", "TROPONINI" in the last 168 hours. BNP (last 3 results) No results for input(s): "PROBNP" in the last 8760 hours. HbA1C: No results for input(s): "HGBA1C" in the last 72 hours. CBG: Recent Labs  Lab 05/10/23 1101 05/10/23 1805 05/11/23 0003 05/11/23 0537  GLUCAP 124* 129* 125* 117*   Lipid Profile: No results for input(s): "CHOL", "HDL", "LDLCALC", "TRIG", "CHOLHDL", "LDLDIRECT" in the last 72 hours. Thyroid Function Tests: No results for input(s): "TSH", "T4TOTAL", "FREET4", "T3FREE", "THYROIDAB" in the last 72 hours. Anemia Panel: No results for input(s): "VITAMINB12", "FOLATE", "FERRITIN", "TIBC", "IRON", "RETICCTPCT" in the last 72 hours. Sepsis Labs: Recent Labs  Lab 05/09/23 1750 05/09/23 1921  LATICACIDVEN 1.4 1.9    Recent Results (from the past 240 hour(s))  Surgical PCR screen     Status: None   Collection Time: 05/10/23  3:28 AM   Specimen: Nasal Mucosa; Nasal Swab  Result Value Ref Range Status   MRSA, PCR NEGATIVE NEGATIVE Final   Staphylococcus aureus NEGATIVE NEGATIVE Final    Comment: (NOTE) The Xpert SA Assay (FDA approved for NASAL specimens in patients 58 years of age and older), is one component of a comprehensive surveillance program. It is not intended to diagnose infection nor to guide or monitor treatment. Performed at [facility], [address], [place], [place] [address]          Radiology Studies: DG Abd Portable 1 View  Result Date: 05/09/2023 CLINICAL DATA:  Check gastric catheter placement EXAM: PORTABLE ABDOMEN - 1 VIEW COMPARISON:  None Available. FINDINGS: Gastric catheter is noted within the stomach. No free air is seen. The dilated loops of small bowel are not well appreciated on this exam. IMPRESSION: Gastric catheter within the stomach. Electronically Signed   By: Alcide Clever M.D.   On: 05/09/2023 22:54   CT ABDOMEN PELVIS W CONTRAST  Result Date:  05/09/2023 CLINICAL DATA:  Right groin pain, known hernia EXAM: CT ABDOMEN AND PELVIS WITH CONTRAST TECHNIQUE: Multidetector CT imaging of the abdomen and pelvis was performed using the standard protocol following bolus administration of intravenous contrast. RADIATION DOSE REDUCTION: This exam was performed according to the departmental dose-optimization program which includes automated exposure control, adjustment of the mA and/or kV according to patient size and/or use of iterative reconstruction technique. CONTRAST:  OMNIPAQUE IOHEXOL 300 MG/ML  SOLN COMPARISON:  04/19/2023 FINDINGS: Lower chest: Bilateral lower lobe scarring/atelectasis. Hepatobiliary: Scattered hepatic cysts measuring up to 15 mm inferiorly in the left hepatic lobe (series 2/image 29). Status post cholecystectomy. No intrahepatic ductal dilatation. Dilated common  duct, measuring 12 mm centrally, and smoothly tapering at the ampulla, likely postsurgical. Pancreas: 11 mm unilocular cyst along the inferior aspect of the pancreatic body (series 2/image 28), likely a small pseudocyst or side branch IPMN. No associated parenchymal atrophy or main pancreatic ductal dilatation. Spleen: Within normal limits Adrenals/Urinary Tract: Adrenal glands are within normal limits. Multiple bilateral renal cysts, most of which measure simple fluid density, measuring to 2.7 cm the posterior left lower kidney (series 2/image 31), benign (Bosniak I). Additional 1.8 cm hyperdense lesion in the anterior right upper kidney (series 2/image 24) and 1.9 cm hyperdense lesion in the left lower renal sinus (series 2/image 36), indeterminate but favoring a benign hemorrhagic cysts. No renal, ureteral, or bladder calculi.  No hydronephrosis. Bladder is within normal limits. Stomach/Bowel: Stomach is notable for a 14 mm fat density lesion along the greater curvature of the stomach (series 2/image 17), suggesting a small lipoma. Dilated loops of small bowel in the left  mid/lower abdomen (series 2/image 87), with transition entering a moderate right spigelian hernia (series 2/image 84), compatible with small bowel obstruction. Decompressed loops of distal small bowel within and exiting the hernia, with mild mesenteric stranding (series 2/image 60). No pneumatosis or free air. Appendix is not discretely visualized. Scattered colonic diverticulosis, without evidence of diverticulitis. Vascular/Lymphatic: No evidence of abdominal aortic aneurysm. Atherosclerotic calcifications of the abdominal aorta and branch vessels. No suspicious abdominopelvic lymphadenopathy. Reproductive: Status post hysterectomy. Bilateral ovaries are within normal limits. Other: Small fat containing bilateral inguinal hernias (series 2/image 80). Musculoskeletal: Mild degenerative changes of the lower thoracic spine. IMPRESSION: Small bowel obstruction on the basis of a moderate right spigelian hernia. Mild mesenteric stranding within the hernia, raising concern for incarceration. No pneumatosis or free air. Surgical consultation is suggested. 11 mm unilocular cyst along the inferior aspect of the pancreatic body, likely a small pseudocyst or side branch IPMN. Follow-up MRI abdomen with/without contrast is suggested in 2 years. Additional ancillary findings as above. Electronically Signed   By: Charline Bills M.D.   On: 05/09/2023 20:09           LOS: 2 days   Time spent= 35 mins    Miguel Rota, MD [facility]  If 7PM-7AM, please contact night-coverage  05/11/2023, 9:45 AM

## 2023-05-11 NOTE — Hospital Course (Addendum)
[name] is a 72 y.o. F with pAF on Eliquis, HTN, hx CVA, hypothyroidism, CAD DM and depression who presented with abdominal pain and nausea.  CT in the ER suggested SBO with incarceration.  Admitted and Gen Surg consulted.

## 2023-05-12 ENCOUNTER — Encounter (HOSPITAL_COMMUNITY): Payer: Self-pay | Admitting: General Surgery

## 2023-05-12 DIAGNOSIS — K56609 Unspecified intestinal obstruction, unspecified as to partial versus complete obstruction: Secondary | ICD-10-CM | POA: Diagnosis not present

## 2023-05-12 LAB — CBC
HCT: 44.5 % (ref 36.0–46.0)
Hemoglobin: 14.2 g/dL (ref 12.0–15.0)
MCH: 30.6 pg (ref 26.0–34.0)
MCHC: 31.9 g/dL (ref 30.0–36.0)
MCV: 95.9 fL (ref 80.0–100.0)
Platelets: 242 10*3/uL (ref 150–400)
RBC: 4.64 MIL/uL (ref 3.87–5.11)
RDW: 12.8 % (ref 11.5–15.5)
WBC: 12 10*3/uL — ABNORMAL HIGH (ref 4.0–10.5)
nRBC: 0 % (ref 0.0–0.2)

## 2023-05-12 LAB — GLUCOSE, CAPILLARY
Glucose-Capillary: 113 mg/dL — ABNORMAL HIGH (ref 70–99)
Glucose-Capillary: 118 mg/dL — ABNORMAL HIGH (ref 70–99)
Glucose-Capillary: 121 mg/dL — ABNORMAL HIGH (ref 70–99)
Glucose-Capillary: 140 mg/dL — ABNORMAL HIGH (ref 70–99)

## 2023-05-12 LAB — BASIC METABOLIC PANEL
Anion gap: 12 (ref 5–15)
BUN: 8 mg/dL (ref 8–23)
CO2: 25 mmol/L (ref 22–32)
Calcium: 8 mg/dL — ABNORMAL LOW (ref 8.9–10.3)
Chloride: 101 mmol/L (ref 98–111)
Creatinine, Ser: 0.77 mg/dL (ref 0.44–1.00)
GFR, Estimated: >60 mL/min (ref >60)
Glucose, Bld: 121 mg/dL — ABNORMAL HIGH (ref 70–99)
Potassium: 3.4 mmol/L — ABNORMAL LOW (ref 3.5–5.1)
Sodium: 138 mmol/L (ref 135–145)

## 2023-05-12 LAB — MAGNESIUM: Magnesium: 1.8 mg/dL (ref 1.7–2.4)

## 2023-05-12 MED ORDER — DOCUSATE SODIUM 100 MG PO CAPS
100.0000 mg | ORAL_CAPSULE | Freq: Two times a day (BID) | ORAL | Status: DC
  Administered 2023-05-12 – 2023-05-13 (×3): 100 mg via ORAL
  Filled 2023-05-12 (×3): qty 1

## 2023-05-12 NOTE — Progress Notes (Signed)
Tech came to this nurse and stated patients pulse was 120-130s per monitor. EKG obtained and in chart, Dr. Thomes Dinning made aware, no new orders at this time.

## 2023-05-12 NOTE — Progress Notes (Addendum)
[facility]  Doing fair. No nausea. NG with minimal output. Had 2 Bms before surgery she says.   BP 118/68 (BP Location: Left Arm)   Pulse 83   Temp 98.9 F (37.2 C) (Oral)   Resp 18   Ht 5\' 5"  (1.651 m)   Wt 81.7 kg   SpO2 95%   BMI 29.97 kg/m  Soft, nondistended, port sites c/d/I with dermabond  Patient s/p robotic assisted ventral hernia repair with mesh. Doing fair.  NG out Clear diet PRN for pain Ambulate Colace  Hold eliquis another day.  Possibly adv diet later if does well with clears.  Home once tolerating diet and showing that bowels are  functioning. Possibly tomorrow.   Algis Greenhouse, MD [facility] [name] [place], [place] [address] [phone] (office)

## 2023-05-12 NOTE — Progress Notes (Signed)
PROGRESS NOTE    [name]  GNF:[id number] DOB: 1950/10/20 DOA: 05/09/2023 PCP: Gwenlyn Found, MD     Brief Narrative:  72 year old with history of paroxysmal A-fib on Eliquis, depression, anxiety, HTN, HLD, CVA, MI comes to the hospital abdominal pain.  Was diagnosed with hernia on Labor Day weekend and since then having abdominal pain with nausea.  CT showed SBO with hernia concerning for incarceration, general surgery consulted.  Plans for OR 9/18.     Assessment & Plan:  Principal Problem:   SBO (small bowel obstruction) (HCC) Active Problems:   Hypertension   Depression   Hyperlipidemia   Paroxysmal atrial fibrillation (HCC)   SBO (small bowel obstruction) (HCC) secondary to right-sided moderate spigelian hernia status post laparoscopic ventral hernia repair 9/18 Routine postop management per general surgery.   Paroxysmal atrial fibrillation (HCC) IV prn meds for now.  Resume Eliquis once cleared by general surgery   Hyperlipidemia Statin on hold   Depression Continue to hold p.o. meds   Hypertension IV as needed  Hypokalemia - Repletion  Will allow p.o. medication once cleared by general surgery   DVT prophylaxis: SCDs Start: 05/09/23 2319 Code Status: DNR Family Communication:   Status is: Inpatient Remains inpatient appropriate because: Routine postop recovery       Subjective: Sitting up in the recliner, no complaints   Examination:  General exam: Appears calm and comfortable  Respiratory system: Clear to auscultation. Respiratory effort normal. Cardiovascular system: S1 & S2 heard, RRR. No JVD, murmurs, rubs, gallops or clicks. No pedal edema. Gastrointestinal system: Abdomen is nondistended, soft and nontender. No organomegaly or masses felt. Normal bowel sounds heard. Central nervous system: Alert and oriented. No focal neurological deficits. Extremities: Symmetric 5 x 5 power. Skin: No rashes, lesions or ulcers, surgical incision  appears clean Psychiatry: Judgement and insight appear normal. Mood & affect appropriate.       Diet Orders (From admission, onward)     Start     Ordered   05/12/23 0825  Diet clear liquid Fluid consistency: Thin  Diet effective now       Question:  Fluid consistency:  Answer:  Thin   05/12/23 0824            Objective: Vitals:   05/11/23 1845 05/11/23 1857 05/11/23 2008 05/12/23 0533  BP: 110/66 123/84 114/76 118/68  Pulse: (!) 102 100 83   Resp: 12 14 18    Temp:  98.4 F (36.9 C) 98.6 F (37 C) 98.9 F (37.2 C)  TempSrc:    Oral  SpO2: 98% 97% 96% 95%  Weight:      Height:        Intake/Output Summary (Last 24 hours) at 05/12/2023 1155 Last data filed at 05/12/2023 0843 Gross per 24 hour  Intake 1300 ml  Output 1405 ml  Net -105 ml   Filed Weights   05/09/23 1648 05/09/23 2326  Weight: 79.3 kg 81.7 kg    Scheduled Meds:  docusate sodium  100 mg Oral BID   Continuous Infusions:  sodium chloride 75 mL/hr at 05/11/23 1917    Nutritional status     Body mass index is 29.97 kg/m.  Data Reviewed:   CBC: Recent Labs  Lab 05/09/23 1750 05/09/23 1757 05/10/23 0555 05/11/23 0426 05/12/23 0434  WBC 10.4  --  11.2* 8.7 12.0*  NEUTROABS 8.7*  --  7.1  --   --   HGB 14.9 16.3* 13.5 12.7 14.2  HCT 46.6* 48.0* 43.3  41.6 44.5  MCV 96.3  --  96.4 97.4 95.9  PLT 275  --  251 227 242   Basic Metabolic Panel: Recent Labs  Lab 05/09/23 1750 05/09/23 1757 05/10/23 0555 05/11/23 0426 05/12/23 0434  NA 137 141 138 139 138  K 4.0 4.0 3.6 4.0 3.4*  CL 102 102 101 102 101  CO2 25  --  25 28 25   GLUCOSE 161* 149* 103* 124* 121*  BUN 18 19 15 11 8   CREATININE 0.93 0.90 0.84 0.87 0.77  CALCIUM 8.9  --  8.3* 8.1* 8.0*  MG  --   --  1.9 2.2 1.8   GFR: Estimated Creatinine Clearance: 69.1 mL/min (by C-G formula based on SCr of 0.77 mg/dL). Liver Function Tests: Recent Labs  Lab 05/09/23 1750 05/10/23 0555  AST 36 29  ALT 41 33  ALKPHOS 36* 31*   BILITOT 0.9 0.7  PROT 7.7 6.7  ALBUMIN 4.1 3.4*   No results for input(s): "LIPASE", "AMYLASE" in the last 168 hours. No results for input(s): "AMMONIA" in the last 168 hours. Coagulation Profile: No results for input(s): "INR", "PROTIME" in the last 168 hours. Cardiac Enzymes: No results for input(s): "CKTOTAL", "CKMB", "CKMBINDEX", "TROPONINI" in the last 168 hours. BNP (last 3 results) No results for input(s): "PROBNP" in the last 8760 hours. HbA1C: No results for input(s): "HGBA1C" in the last 72 hours. CBG: Recent Labs  Lab 05/11/23 0537 05/11/23 1129 05/11/23 1822 05/12/23 0043 05/12/23 0632  GLUCAP 117* 101* 131* 113* 118*   Lipid Profile: No results for input(s): "CHOL", "HDL", "LDLCALC", "TRIG", "CHOLHDL", "LDLDIRECT" in the last 72 hours. Thyroid Function Tests: No results for input(s): "TSH", "T4TOTAL", "FREET4", "T3FREE", "THYROIDAB" in the last 72 hours. Anemia Panel: No results for input(s): "VITAMINB12", "FOLATE", "FERRITIN", "TIBC", "IRON", "RETICCTPCT" in the last 72 hours. Sepsis Labs: Recent Labs  Lab 05/09/23 1750 05/09/23 1921  LATICACIDVEN 1.4 1.9    Recent Results (from the past 240 hour(s))  Surgical PCR screen     Status: None   Collection Time: 05/10/23  3:28 AM   Specimen: Nasal Mucosa; Nasal Swab  Result Value Ref Range Status   MRSA, PCR NEGATIVE NEGATIVE Final   Staphylococcus aureus NEGATIVE NEGATIVE Final    Comment: (NOTE) The Xpert SA Assay (FDA approved for NASAL specimens in patients 27 years of age and older), is one component of a comprehensive surveillance program. It is not intended to diagnose infection nor to guide or monitor treatment. Performed at [facility], [address], [place], [place] [address]          Radiology Studies: [facility] 1 View  Result Date: 05/11/2023 CLINICAL DATA:  NG tube placement EXAM: PORTABLE CHEST 1 VIEW COMPARISON:  08/02/2020 FINDINGS: Mild linear scarring/atelectasis left lung  base. Right lung is clear No pleural effusion or pneumothorax. Heart is normal in size. Enteric tube terminates in the gastric antrum. Cholecystectomy clips. IMPRESSION: Enteric tube terminates in the gastric antrum. Electronically Signed   By: Charline Bills M.D.   On: 05/11/2023 19:42           LOS: 3 days   Time spent= 35 mins    Miguel Rota, MD [facility]  If 7PM-7AM, please contact night-coverage  05/12/2023, 11:55 AM

## 2023-05-12 NOTE — Care Management Important Message (Signed)
Important Message  Patient Details  Name: [name] MRN: [id number] Date of Birth: 01/02/1951   Medicare Important Message Given:  N/A - LOS <3 / Initial given by admissions     Corey Harold 05/12/2023, 10:16 AM

## 2023-05-12 NOTE — Plan of Care (Signed)

## 2023-05-13 DIAGNOSIS — K56609 Unspecified intestinal obstruction, unspecified as to partial versus complete obstruction: Secondary | ICD-10-CM | POA: Diagnosis not present

## 2023-05-13 DIAGNOSIS — E876 Hypokalemia: Secondary | ICD-10-CM | POA: Insufficient documentation

## 2023-05-13 LAB — BASIC METABOLIC PANEL
Anion gap: 9 (ref 5–15)
BUN: 11 mg/dL (ref 8–23)
CO2: 27 mmol/L (ref 22–32)
Calcium: 7.6 mg/dL — ABNORMAL LOW (ref 8.9–10.3)
Chloride: 101 mmol/L (ref 98–111)
Creatinine, Ser: 0.77 mg/dL (ref 0.44–1.00)
GFR, Estimated: >60 mL/min (ref >60)
Glucose, Bld: 107 mg/dL — ABNORMAL HIGH (ref 70–99)
Potassium: 3.1 mmol/L — ABNORMAL LOW (ref 3.5–5.1)
Sodium: 137 mmol/L (ref 135–145)

## 2023-05-13 LAB — GLUCOSE, CAPILLARY
Glucose-Capillary: 101 mg/dL — ABNORMAL HIGH (ref 70–99)
Glucose-Capillary: 119 mg/dL — ABNORMAL HIGH (ref 70–99)
Glucose-Capillary: 87 mg/dL (ref 70–99)

## 2023-05-13 LAB — CBC
HCT: 37.8 % (ref 36.0–46.0)
Hemoglobin: 12 g/dL (ref 12.0–15.0)
MCH: 30.5 pg (ref 26.0–34.0)
MCHC: 31.7 g/dL (ref 30.0–36.0)
MCV: 96.2 fL (ref 80.0–100.0)
Platelets: 217 10*3/uL (ref 150–400)
RBC: 3.93 MIL/uL (ref 3.87–5.11)
RDW: 13 % (ref 11.5–15.5)
WBC: 10.8 10*3/uL — ABNORMAL HIGH (ref 4.0–10.5)
nRBC: 0 % (ref 0.0–0.2)

## 2023-05-13 LAB — MAGNESIUM: Magnesium: 1.8 mg/dL (ref 1.7–2.4)

## 2023-05-13 MED ORDER — POTASSIUM CHLORIDE 20 MEQ PO PACK
40.0000 meq | PACK | Freq: Two times a day (BID) | ORAL | Status: DC
  Administered 2023-05-13: 40 meq via ORAL
  Filled 2023-05-13: qty 2

## 2023-05-13 MED ORDER — ROSUVASTATIN CALCIUM 20 MG PO TABS: 40.0000 mg | ORAL_TABLET | Freq: Every day | ORAL | Status: DC

## 2023-05-13 MED ORDER — APIXABAN 5 MG PO TABS
5.0000 mg | ORAL_TABLET | Freq: Two times a day (BID) | ORAL | Status: DC
  Administered 2023-05-13: 5 mg via ORAL
  Filled 2023-05-13: qty 1

## 2023-05-13 MED ORDER — CITALOPRAM HYDROBROMIDE 20 MG PO TABS
20.0000 mg | ORAL_TABLET | Freq: Every day | ORAL | Status: DC
  Administered 2023-05-13: 20 mg via ORAL
  Filled 2023-05-13: qty 1

## 2023-05-13 MED ORDER — OXYCODONE HCL 5 MG PO TABS: 5.0000 mg | ORAL_TABLET | Freq: Four times a day (QID) | ORAL | 0 refills | Status: AC | PRN

## 2023-05-13 MED ORDER — METOPROLOL TARTRATE 25 MG PO TABS
25.0000 mg | ORAL_TABLET | Freq: Two times a day (BID) | ORAL | Status: DC
  Administered 2023-05-13: 25 mg via ORAL
  Filled 2023-05-13: qty 1

## 2023-05-13 MED ORDER — LEVOTHYROXINE SODIUM 88 MCG PO TABS
88.0000 ug | ORAL_TABLET | Freq: Every day | ORAL | Status: DC
  Administered 2023-05-13: 88 ug via ORAL
  Filled 2023-05-13: qty 1

## 2023-05-13 MED ORDER — ALPRAZOLAM 0.5 MG PO TABS
0.5000 mg | ORAL_TABLET | Freq: Every day | ORAL | Status: DC | PRN
  Administered 2023-05-13: 0.5 mg via ORAL
  Filled 2023-05-13: qty 1

## 2023-05-13 NOTE — Discharge Summary (Signed)
Physician Discharge Summary   Patient: [name] MRN: [id number] DOB: 1951-05-30  Admit date:     05/09/2023  Discharge date: 05/13/23  Discharge Physician: Alberteen Sam   PCP: Gwenlyn Found, MD     Recommendations at discharge:  Follow up with Dr. Henreitta Leber General Surgery in 1 week for SBO due to incarcerated hernia     Discharge Diagnoses: Principal Problem:   SBO (small bowel obstruction) secondary to right-sided moderate spigelian hernia Active Problems:   Diabetes type 2, controlled (HCC)   Hypertension   Cerebrovascular disease   Depression   Hyperlipidemia   Coronary artery disease   Paroxysmal atrial fibrillation (HCC)   Hypokalemia      Hospital Course: [name] is a 72 y.o. F with pAF on Eliquis, HTN, hx CVA, hypothyroidism, CAD DM and depression who presented with abdominal pain and nausea.  CT in the ER suggested SBO with incarceration.  Admitted and Gen Surg consulted.      * SBO (small bowel obstruction) secondary to right-sided moderate spigelian hernia S/p laparoscopic ventral hernia repair 9/18 by Dr. Henreitta Leber Diet advanced and patient felt well.  Had flatus and BM.  Pain well controlled.  Discharged to follow up with Dr. Henreitta Leber in 1-2 weeks.    Paroxysmal atrial fibrillation (HCC) Eliquis resumed at discharge.  Hyperlipidemia On statin  Depression On Celexa and Xanax  Hypertension Controlled on metoprolol            [facility] Controlled Substances Registry was reviewed for this patient prior to discharge.  Consultants: General Surgery, Dr. Henreitta Leber  Procedures performed:  Ventral hernia repair and hernia reduction   Disposition: Home Diet recommendation:  Regular diet  DISCHARGE MEDICATION: Allergies as of 05/13/2023       Reactions   Tape Rash   Can only tolerate LIMITED exposure; no "plastic tape," please   Wound Dressing Adhesive Rash   Can only tolerate LIMITED exposure; no  "plastic tape," please        Medication List     TAKE these medications    alendronate 70 MG tablet Commonly known as: FOSAMAX Take 70 mg by mouth once a week.   ALPRAZolam 0.5 MG tablet Commonly known as: XANAX Take 0.5 mg by mouth daily as needed for anxiety.   ARTIFICIAL TEAR OP Place 1 drop into both eyes daily as needed (dry eyes).   citalopram 20 MG tablet Commonly known as: CELEXA Take 20 mg by mouth daily.   Eliquis 5 MG Tabs tablet Generic drug: apixaban TAKE 1 TABLET(5 MG) BY MOUTH TWICE DAILY   metoprolol tartrate 25 MG tablet Commonly known as: LOPRESSOR Take 1 tablet (25 mg total) by mouth 2 (two) times daily.   oxyCODONE 5 MG immediate release tablet Commonly known as: Oxy IR/ROXICODONE Take 1 tablet (5 mg total) by mouth every 6 (six) hours as needed for moderate pain.   Oyster Shell Calcium 250+D 250-3.125 MG-MCG Tabs Generic drug: Calcium Carb-Cholecalciferol Take 1 tablet by mouth daily.   rosuvastatin 40 MG tablet Commonly known as: CRESTOR Take 1 tablet (40 mg total) by mouth at bedtime.   Synthroid 88 MCG tablet Generic drug: levothyroxine Take 88 mcg by mouth daily.   Vitamin D (Ergocalciferol) 1.25 MG (50000 UNIT) Caps capsule Commonly known as: DRISDOL Take 50,000 Units by mouth once a week.        Follow-up Information     Lucretia Roers, MD Follow up on 06/14/2023.   Specialty: General  Surgery Why: follow up hernia check Contact information: [address] [facility] [address] [phone]                 Discharge Instructions     Discharge instructions   Complete by: As directed    **IMPORTANT DISCHARGE INSTRUCTIONS**   From Dr. Maryfrances Bunnell: You were admitted for a bowel obstruction  This obstruction was caused by your bowels getting caught in a hernia  You had a surgery to decompress the obstruction and this was successful  You should follow up with Dr. Henreitta Leber  Follow all wound care  instructions she gave you.  If you have any questions, call her office at the number listed below in the To Do section  If you have pain, you may take over the counter acetaminophen 1000 mg up to three times per day If this is not enough, you may take additional oxycodone 5 mg up to 3 times perday Be careful, this is a narcotic, so it is strong, might cause nausea, might cause constipation and should not be taken before driving.   Resume all your other home medicines   Increase activity slowly   Complete by: As directed        Discharge Exam: Filed Weights   05/09/23 1648 05/09/23 2326  Weight: 79.3 kg 81.7 kg    General: Pt is alert, awake, not in acute distress Cardiovascular: RRR, nl S1-S2, no murmurs appreciated.   No LE edema.   Respiratory: Normal respiratory rate and rhythm.  CTAB without rales or wheezes. Abdominal: Abdomen soft and non-tender.  No distension or HSM.   Neuro/Psych: Strength symmetric in upper and lower extremities.  Judgment and insight appear normal.   Condition at discharge: good  The results of significant diagnostics from this hospitalization (including imaging, microbiology, ancillary and laboratory) are listed below for reference.   Imaging Studies: DG Chest Port 1 View  Result Date: 05/11/2023 CLINICAL DATA:  NG tube placement EXAM: PORTABLE CHEST 1 VIEW COMPARISON:  08/02/2020 FINDINGS: Mild linear scarring/atelectasis left lung base. Right lung is clear No pleural effusion or pneumothorax. Heart is normal in size. Enteric tube terminates in the gastric antrum. Cholecystectomy clips. IMPRESSION: Enteric tube terminates in the gastric antrum. Electronically Signed   By: Charline Bills M.D.   On: 05/11/2023 19:42   DG Abd Portable 1 View  Result Date: 05/09/2023 CLINICAL DATA:  Check gastric catheter placement EXAM: PORTABLE ABDOMEN - 1 VIEW COMPARISON:  None Available. FINDINGS: Gastric catheter is noted within the stomach. No free air is seen.  The dilated loops of small bowel are not well appreciated on this exam. IMPRESSION: Gastric catheter within the stomach. Electronically Signed   By: Alcide Clever M.D.   On: 05/09/2023 22:54   CT ABDOMEN PELVIS W CONTRAST  Result Date: 05/09/2023 CLINICAL DATA:  Right groin pain, known hernia EXAM: CT ABDOMEN AND PELVIS WITH CONTRAST TECHNIQUE: Multidetector CT imaging of the abdomen and pelvis was performed using the standard protocol following bolus administration of intravenous contrast. RADIATION DOSE REDUCTION: This exam was performed according to the departmental dose-optimization program which includes automated exposure control, adjustment of the mA and/or kV according to patient size and/or use of iterative reconstruction technique. CONTRAST:  OMNIPAQUE IOHEXOL 300 MG/ML  SOLN COMPARISON:  04/19/2023 FINDINGS: Lower chest: Bilateral lower lobe scarring/atelectasis. Hepatobiliary: Scattered hepatic cysts measuring up to 15 mm inferiorly in the left hepatic lobe (series 2/image 29). Status post cholecystectomy. No intrahepatic ductal dilatation. Dilated common duct,  measuring 12 mm centrally, and smoothly tapering at the ampulla, likely postsurgical. Pancreas: 11 mm unilocular cyst along the inferior aspect of the pancreatic body (series 2/image 28), likely a small pseudocyst or side branch IPMN. No associated parenchymal atrophy or main pancreatic ductal dilatation. Spleen: Within normal limits Adrenals/Urinary Tract: Adrenal glands are within normal limits. Multiple bilateral renal cysts, most of which measure simple fluid density, measuring to 2.7 cm the posterior left lower kidney (series 2/image 31), benign (Bosniak I). Additional 1.8 cm hyperdense lesion in the anterior right upper kidney (series 2/image 24) and 1.9 cm hyperdense lesion in the left lower renal sinus (series 2/image 36), indeterminate but favoring a benign hemorrhagic cysts. No renal, ureteral, or bladder calculi.  No  hydronephrosis. Bladder is within normal limits. Stomach/Bowel: Stomach is notable for a 14 mm fat density lesion along the greater curvature of the stomach (series 2/image 17), suggesting a small lipoma. Dilated loops of small bowel in the left mid/lower abdomen (series 2/image 87), with transition entering a moderate right spigelian hernia (series 2/image 84), compatible with small bowel obstruction. Decompressed loops of distal small bowel within and exiting the hernia, with mild mesenteric stranding (series 2/image 60). No pneumatosis or free air. Appendix is not discretely visualized. Scattered colonic diverticulosis, without evidence of diverticulitis. Vascular/Lymphatic: No evidence of abdominal aortic aneurysm. Atherosclerotic calcifications of the abdominal aorta and branch vessels. No suspicious abdominopelvic lymphadenopathy. Reproductive: Status post hysterectomy. Bilateral ovaries are within normal limits. Other: Small fat containing bilateral inguinal hernias (series 2/image 80). Musculoskeletal: Mild degenerative changes of the lower thoracic spine. IMPRESSION: Small bowel obstruction on the basis of a moderate right spigelian hernia. Mild mesenteric stranding within the hernia, raising concern for incarceration. No pneumatosis or free air. Surgical consultation is suggested. 11 mm unilocular cyst along the inferior aspect of the pancreatic body, likely a small pseudocyst or side branch IPMN. Follow-up MRI abdomen with/without contrast is suggested in 2 years. Additional ancillary findings as above. Electronically Signed   By: Charline Bills M.D.   On: 05/09/2023 20:09   CT ABDOMEN PELVIS W CONTRAST  Result Date: 04/19/2023 CLINICAL DATA:  Pain at incision hernia EXAM: CT ABDOMEN AND PELVIS WITH CONTRAST TECHNIQUE: Multidetector CT imaging of the abdomen and pelvis was performed using the standard protocol following bolus administration of intravenous contrast. RADIATION DOSE REDUCTION: This  exam was performed according to the departmental dose-optimization program which includes automated exposure control, adjustment of the mA and/or kV according to patient size and/or use of iterative reconstruction technique. CONTRAST:  85mL OMNIPAQUE IOHEXOL 300 MG/ML  SOLN COMPARISON:  CT abdomen and pelvis dated 06/29/2021 FINDINGS: Lower chest: No focal consolidation or pulmonary nodule in the lung bases. No pleural effusion or pneumothorax demonstrated. Partially imaged heart size is normal. Hepatobiliary: Multifocal hypodensities, likely cysts. No intra or extrahepatic biliary ductal dilation. Cholecystectomy. Pancreas: Increased conspicuity of 1.1 cm hypoattenuating focus within the pancreatic neck (2:22), unchanged in size compared to 06/29/2021. Additional subcentimeter hypodensities within the pancreatic body. No main ductal dilation, mass lesion, or abnormal enhancement. Spleen: Normal in size without focal abnormality. Adrenals/Urinary Tract: No adrenal nodules. Innumerable bilateral renal cysts are again seen, including a hemorrhagic/proteinaceous cyst in the lower pole left kidney measuring 1.6 cm (2:31). No hydronephrosis. Punctate nonobstructing right lower pole stone. No focal bladder wall thickening. Stomach/Bowel: Normal appearance of the stomach. No evidence of bowel wall thickening, distention, or inflammatory changes. Colonic diverticulosis without acute diverticulitis. Appendectomy. Vascular/Lymphatic: Aortic atherosclerosis. Circumaortic left renal vein. No enlarged  abdominal or pelvic lymph nodes. Reproductive: No adnexal masses. Other: No free fluid, fluid collection, or free air. Musculoskeletal: No acute or abnormal lytic or blastic osseous lesions. Right spigelian hernia contains fat and a loop of nondilated small bowel. Small fat-containing bilateral inguinal hernias. IMPRESSION: 1. Right Spigelian hernia contains fat and a loop of nondilated small bowel. No abnormal upstream bowel  dilation to suggest obstruction. Recommend correlation with physical examination for strangulation. 2. 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 Recommend follow-up MRI/MRCP in 1 year. 3. Innumerable bilateral renal cysts, including a hemorrhagic/proteinaceous cyst in the lower pole left kidney measuring 1.6 cm. These can be evaluated at the same time on above recommended MRI. 4. Colonic diverticulosis without acute diverticulitis. 5.  Aortic Atherosclerosis (ICD10-I70.0). Electronically Signed   By: Agustin Cree M.D.   On: 04/19/2023 18:39    Microbiology: Results for orders placed or performed during the hospital encounter of 05/09/23  Surgical PCR screen     Status: None   Collection Time: 05/10/23  3:28 AM   Specimen: Nasal Mucosa; Nasal Swab  Result Value Ref Range Status   MRSA, PCR NEGATIVE NEGATIVE Final   Staphylococcus aureus NEGATIVE NEGATIVE Final    Comment: (NOTE) The Xpert SA Assay (FDA approved for NASAL specimens in patients 73 years of age and older), is one component of a comprehensive surveillance program. It is not intended to diagnose infection nor to guide or monitor treatment. Performed at [facility], [address], [place], [place] [address]     Labs: CBC: Recent Labs  Lab 05/09/23 1750 05/09/23 1757 05/10/23 0555 05/11/23 0426 05/12/23 0434 05/13/23 0413  WBC 10.4  --  11.2* 8.7 12.0* 10.8*  NEUTROABS 8.7*  --  7.1  --   --   --   HGB 14.9 16.3* 13.5 12.7 14.2 12.0  HCT 46.6* 48.0* 43.3 41.6 44.5 37.8  MCV 96.3  --  96.4 97.4 95.9 96.2  PLT 275  --  251 227 242 217   Basic Metabolic Panel: Recent Labs  Lab 05/09/23 1750 05/09/23 1757 05/10/23 0555 05/11/23 0426  05/12/23 0434 05/13/23 0413  NA 137 141 138 139 138 137  K 4.0 4.0 3.6 4.0 3.4* 3.1*  CL 102 102 101 102 101 101  CO2 25  --  25 28 25 27   GLUCOSE 161* 149* 103* 124* 121* 107*  BUN 18 19 15 11 8 11   CREATININE 0.93 0.90 0.84 0.87 0.77 0.77  CALCIUM 8.9  --  8.3* 8.1* 8.0* 7.6*  MG  --   --  1.9 2.2 1.8 1.8   Liver Function Tests: Recent Labs  Lab 05/09/23 1750 05/10/23 0555  AST 36 29  ALT 41 33  ALKPHOS 36* 31*  BILITOT 0.9 0.7  PROT 7.7 6.7  ALBUMIN 4.1 3.4*   CBG: Recent Labs  Lab 05/12/23 1159 05/12/23 1626 05/13/23 0038 05/13/23 0529 05/13/23 1136  GLUCAP 140* 121* 119* 87 101*    Discharge time spent: approximately 35 minutes spent on discharge counseling, evaluation of patient on day of discharge, and coordination of discharge planning with nursing, social work, pharmacy and case management  Signed: Alberteen Sam, MD [facility] 05/13/2023

## 2023-05-13 NOTE — Discharge Instructions (Signed)
Discharge Robotic Assisted Laparoscopic Surgery Instructions:  Common Complaints: Right shoulder pain is common after laparoscopic surgery.  This is secondary to the gas used in the surgery being trapped under the diaphragm.  Walk to help your body absorb the gas. This will improve in a few days. Pain at the port sites are common, especially the larger port sites. This will improve with time.  Some nausea is common and poor appetite. The main goal is to stay hydrated the first few days after surgery.   Diet/ Activity: Diet as tolerated. You may not have an appetite, but it is important to stay hydrated.  Drink 64 ounces of water a day. Your appetite will return with time.  Shower per your regular routine daily.  Do not take hot showers. Take warm showers that are less than 10 minutes. Rest and listen to your body, but do not remain in bed all day.  Walk everyday for at least 15-20 minutes. Deep cough and move around every 1-2 hours in the first few days after surgery.  Do not lift > 10 lbs, perform excessive bending, pushing, pulling, squatting for 6 weeks after surgery.  Do not pick at the dermabond glue on your incision sites.  This glue film will remain in place for 1-2 weeks and will start to peel off.  Do not place lotions or balms on your incision unless instructed to specifically by Dr. Henreitta Leber.   Pain Expectations and Narcotics: -After surgery you will have pain associated with your incisions and this is normal. The pain is muscular and nerve pain, and will get better with time. -You are encouraged and expected to take non narcotic medications like tylenol and ibuprofen (when able) to treat pain as multiple modalities can aid with pain treatment. -Narcotics are only used when pain is severe or there is breakthrough pain. -You are not expected to have a pain score of 0 after surgery, as we cannot prevent pain. A pain score of 3-4 that allows you to be functional, move, walk, and  tolerate some activity is the goal. The pain will continue to improve over the days after surgery and is dependent on your surgery. -Due to [place] law, we are only able to give a certain amount of pain medication to treat post operative pain, and we only give additional narcotics on a patient by patient basis.  -For most laparoscopic surgery, studies have shown that the majority of patients only need 10-15 narcotic pills, and for open surgeries most patients only need 15-20.   -Having appropriate expectations of pain and knowledge of pain management with non narcotics is important as we do not want anyone to become addicted to narcotic pain medication.  -Using ice packs in the first 48 hours and heating pads after 48 hours, wearing an abdominal binder (when recommended), and using over the counter medications are all ways to help with pain management.   -Simple acts like meditation and mindfulness practices after surgery can also help with pain control and research has proven the benefit of these practices.  Medication: Take tylenol and ibuprofen as needed for pain control, alternating every 4-6 hours.  Example:  Tylenol 1000mg  @ 6am, 12noon, 6pm, (Do not exceed 4000mg  of tylenol a day). Ibuprofen 800mg  @ 9am, 3pm, 9pm, 3am (Do not exceed 3600mg  of ibuprofen a day).  Take Roxicodone for breakthrough pain every 4 hours.  Take Colace for constipation related to narcotic pain medication. If you do not have a bowel movement in  2 days, take Miralax over the counter.  Drink plenty of water to also prevent constipation.   Contact Information: If you have questions or concerns, please call our office, [phone], Monday- Thursday 8AM-5PM and Friday 8AM-12Noon.  If it is after hours or on the weekend, please call [facility], [phone], [phone], and ask to speak to the surgeon on call for Dr. Henreitta Leber at [facility].

## 2023-05-13 NOTE — Care Management Important Message (Signed)
Important Message  Patient Details  Name: [name] MRN: [id number] Date of Birth: [birth year]   Medicare Important Message Given:  Yes     Corey Harold 05/13/2023, 11:51 AM

## 2023-05-13 NOTE — Progress Notes (Signed)
[facility]  Doing fair. Having BM and tolerating diet. Wants to go home.  BP 125/80   Pulse 72   Temp 98.9 F (37.2 C)   Resp 18   Ht 5\' 5"  (1.651 m)   Wt 81.7 kg   SpO2 96%   BMI 29.97 kg/m  Soft, nondistended, appropriately tender, port sites c/d/I with dermabond  Patient s/p robotic assisted laparoscopic ventral hernia repair with mesh. Doing well. PRN For pain IS, OOB Home today Follow up with me No heavy lifting > 10 lbs, excessive bending, pushing, pulling, or squatting for 6 weeks after surgery.    Future Appointments  Date Time Provider Department Center  06/14/2023 11:45 AM Lucretia Roers, MD [facility] None   Algis Greenhouse, MD [facility] [name] [place], [place] [address] [phone] (office)

## 2023-05-13 NOTE — Plan of Care (Signed)

## 2023-06-14 ENCOUNTER — Ambulatory Visit: Payer: 59 | Admitting: General Surgery

## 2023-06-14 ENCOUNTER — Encounter: Payer: Self-pay | Admitting: General Surgery

## 2023-06-14 VITALS — BP 151/97 | HR 53 | Temp 97.9°F | Resp 16 | Ht 65.0 in | Wt 177.0 lb

## 2023-06-14 DIAGNOSIS — K432 Incisional hernia without obstruction or gangrene: Secondary | ICD-10-CM

## 2023-06-14 NOTE — Patient Instructions (Signed)
Can increase activity after 4 weeks from surgery. Call with issues. Can start lifting.  If your PCP will not order MRI more local, we can order that for your once you are ready to get it done.

## 2023-06-14 NOTE — Progress Notes (Signed)
[facility]  Doing well. Did have a fall but doing well after that event last week.  BP (!) 151/97   Pulse (!) 53   Temp 97.9 F (36.6 C) (Oral)   Resp 16   Ht 5\' 5"  (1.651 m)   Wt 177 lb (80.3 kg)   SpO2 93%   BMI 29.45 kg/m  Port sites healing well, right sided hernia without recurrence  Patient s/p robotic assisted ventral hernia repair with mesh from incisional hernia.   Can increase activity after 4 weeks from surgery. Call with issues. Can start lifting.  If your PCP will not order MRI more local, we can order that for your once you are ready to get it done.   Algis Greenhouse, MD [facility] [name] [place], [place] [address] [phone] (office)

## 2023-07-14 ENCOUNTER — Other Ambulatory Visit: Payer: Self-pay | Admitting: Family Medicine

## 2023-07-14 DIAGNOSIS — Z1231 Encounter for screening mammogram for malignant neoplasm of breast: Secondary | ICD-10-CM

## 2023-08-04 ENCOUNTER — Other Ambulatory Visit (HOSPITAL_COMMUNITY): Payer: Self-pay | Admitting: Family Medicine

## 2023-08-04 DIAGNOSIS — Q6102 Congenital multiple renal cysts: Secondary | ICD-10-CM

## 2023-08-04 DIAGNOSIS — Q453 Other congenital malformations of pancreas and pancreatic duct: Secondary | ICD-10-CM

## 2023-08-12 ENCOUNTER — Ambulatory Visit (HOSPITAL_COMMUNITY): Payer: 59

## 2023-08-15 ENCOUNTER — Other Ambulatory Visit (HOSPITAL_COMMUNITY): Payer: 59

## 2023-08-18 ENCOUNTER — Encounter (HOSPITAL_COMMUNITY): Payer: Self-pay

## 2023-08-18 ENCOUNTER — Ambulatory Visit (HOSPITAL_COMMUNITY): Payer: 59

## 2023-08-19 ENCOUNTER — Ambulatory Visit: Payer: 59

## 2023-08-31 ENCOUNTER — Ambulatory Visit (HOSPITAL_COMMUNITY)
Admission: RE | Admit: 2023-08-31 | Discharge: 2023-08-31 | Disposition: A | Discharge status: Home / Self Care | Payer: 59 | Source: Ambulatory Visit | Attending: Family Medicine | Admitting: Family Medicine

## 2023-08-31 ENCOUNTER — Other Ambulatory Visit (HOSPITAL_COMMUNITY): Payer: Self-pay | Admitting: Family Medicine

## 2023-08-31 DIAGNOSIS — Q6102 Congenital multiple renal cysts: Secondary | ICD-10-CM | POA: Diagnosis present

## 2023-08-31 DIAGNOSIS — Q453 Other congenital malformations of pancreas and pancreatic duct: Secondary | ICD-10-CM | POA: Insufficient documentation

## 2023-08-31 MED ORDER — GADOBUTROL 1 MMOL/ML IV SOLN
7.0000 mL | Freq: Once | INTRAVENOUS | Status: AC | PRN
  Administered 2023-08-31: 7 mL via INTRAVENOUS

## 2023-10-04 ENCOUNTER — Ambulatory Visit: Payer: 59

## 2024-08-24 LAB — COLOGUARD: COLOGUARD: POSITIVE — AB

## 2024-09-07 ENCOUNTER — Encounter: Payer: Self-pay | Admitting: Gastroenterology

## 2024-10-03 ENCOUNTER — Ambulatory Visit: Admitting: Gastroenterology

## 2024-10-09 ENCOUNTER — Ambulatory Visit: Admitting: Gastroenterology
